# Patient Record
Sex: Female | Born: 1950
Health system: Southern US, Community
[De-identification: ages and names within clinical notes are randomized; demographics above are authoritative.]

## PROBLEM LIST (undated history)

## (undated) DIAGNOSIS — I10 Essential (primary) hypertension: Secondary | ICD-10-CM

## (undated) DIAGNOSIS — Z9889 Other specified postprocedural states: Secondary | ICD-10-CM

## (undated) DIAGNOSIS — R112 Nausea with vomiting, unspecified: Secondary | ICD-10-CM

## (undated) DIAGNOSIS — B029 Zoster without complications: Secondary | ICD-10-CM

## (undated) DIAGNOSIS — C4491 Basal cell carcinoma of skin, unspecified: Secondary | ICD-10-CM

## (undated) DIAGNOSIS — Z789 Other specified health status: Secondary | ICD-10-CM

## (undated) DIAGNOSIS — R609 Edema, unspecified: Secondary | ICD-10-CM

## (undated) HISTORY — DX: Other specified postprocedural states: Z98.890

## (undated) HISTORY — PX: BASAL CELL CARCINOMA EXCISION: SHX1214

## (undated) HISTORY — DX: Essential (primary) hypertension: I10

## (undated) HISTORY — DX: Nausea with vomiting, unspecified: R11.2

## (undated) HISTORY — DX: Zoster without complications: B02.9

## (undated) HISTORY — PX: TOTAL ABDOMINAL HYSTERECTOMY: SHX209

## (undated) HISTORY — PX: ABDOMINAL HYSTERECTOMY: SHX81

---
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
## 2012-08-24 ENCOUNTER — Emergency Department: Payer: Self-pay | Admitting: Emergency Medicine

## 2013-10-05 ENCOUNTER — Emergency Department: Payer: Self-pay | Admitting: Emergency Medicine

## 2014-11-08 ENCOUNTER — Emergency Department
Admission: EM | Admit: 2014-11-08 | Discharge: 2014-11-09 | Disposition: A | Discharge status: Home / Self Care | Payer: Worker's Compensation | Attending: Emergency Medicine | Admitting: Emergency Medicine

## 2014-11-08 ENCOUNTER — Encounter: Payer: Self-pay | Admitting: *Deleted

## 2014-11-08 DIAGNOSIS — Y9389 Activity, other specified: Secondary | ICD-10-CM | POA: Diagnosis not present

## 2014-11-08 DIAGNOSIS — S99922A Unspecified injury of left foot, initial encounter: Secondary | ICD-10-CM | POA: Diagnosis present

## 2014-11-08 DIAGNOSIS — S91332A Puncture wound without foreign body, left foot, initial encounter: Secondary | ICD-10-CM

## 2014-11-08 DIAGNOSIS — Y990 Civilian activity done for income or pay: Secondary | ICD-10-CM | POA: Insufficient documentation

## 2014-11-08 DIAGNOSIS — Y9289 Other specified places as the place of occurrence of the external cause: Secondary | ICD-10-CM | POA: Diagnosis not present

## 2014-11-08 DIAGNOSIS — Y288XXA Contact with other sharp object, undetermined intent, initial encounter: Secondary | ICD-10-CM | POA: Insufficient documentation

## 2014-11-08 MED ORDER — CEPHALEXIN 500 MG PO CAPS: 500.0000 mg | ORAL_CAPSULE | Freq: Two times a day (BID) | ORAL | Status: AC

## 2014-11-08 MED ORDER — TETANUS-DIPHTHERIA TOXOIDS TD 5-2 LFU IM INJ
0.5000 mL | INJECTION | Freq: Once | INTRAMUSCULAR | Status: AC
  Administered 2014-11-08: 0.5 mL via INTRAMUSCULAR
  Filled 2014-11-08: qty 0.5

## 2014-11-08 MED ORDER — CEPHALEXIN 500 MG PO CAPS
500.0000 mg | ORAL_CAPSULE | Freq: Once | ORAL | Status: AC
  Administered 2014-11-08: 500 mg via ORAL
  Filled 2014-11-08: qty 1

## 2014-11-08 MED ORDER — IBUPROFEN 800 MG PO TABS
800.0000 mg | ORAL_TABLET | Freq: Once | ORAL | Status: AC
  Administered 2014-11-08: 800 mg via ORAL
  Filled 2014-11-08: qty 1

## 2014-11-08 NOTE — ED Provider Notes (Signed)
[facility] Emergency Department Provider Note  ____________________________________________  Time seen: 11:30PM  I have reviewed the triage vital signs and the nursing notes.   HISTORY  Chief Complaint Foot Injury     HPI Vanessa Casey is a 64 y.o. female presents with history of stress stepping on a stable while at work in her left foot. Patient denies any fever unknown last tetanus shot. Patient stated that "bled briskly for a while however then spontaneously resolved.       Past medical history None There are no active problems to display for this patient.   Past Surgical History  Procedure Laterality Date  . Abdominal hysterectomy      No current outpatient prescriptions on file.  Allergies Review of patient's allergies indicates no known allergies.  No family history on file.  Social History Social History  Substance Use Topics  . Smoking status: Never Smoker   . Smokeless tobacco: None  . Alcohol Use: No    Review of Systems  Constitutional: Negative for fever. Eyes: Negative for visual changes. ENT: Negative for sore throat. Cardiovascular: Negative for chest pain. Respiratory: Negative for shortness of breath. Gastrointestinal: Negative for abdominal pain, vomiting and diarrhea. Genitourinary: Negative for dysuria. Musculoskeletal: Negative for back pain. Skin: Negative for rash. Positive puncture wound to the left foot Neurological: Negative for headaches, focal weakness or numbness.  10-point ROS otherwise negative.  ____________________________________________   PHYSICAL EXAM:  VITAL SIGNS: ED Triage Vitals  Enc Vitals Group     BP 11/08/14 2202 155/52 mmHg     Pulse Rate 11/08/14 2202 63     Resp 11/08/14 2202 18     Temp 11/08/14 2202 98.7 F (37.1 C)     Temp Source 11/08/14 2202 Oral     SpO2 11/08/14 2202 97 %     Weight 11/08/14 2202 150 lb (68.04 kg)     Height 11/08/14 2202 5' (1.524 m)     Head  Cir --      Peak Flow --      Pain Score 11/08/14 2202 5     Pain Loc --      Pain Edu? --      Excl. in [place]? --      Constitutional: Alert and oriented. Well appearing and in no distress. ENT   Head: Normocephalic and atraumatic.   Nose: No congestion/rhinnorhea.   Mouth/Throat: Mucous membranes are moist.   Neck: No stridor. Musculoskeletal: Nontender with normal range of motion in all extremities. No joint effusions.  No lower extremity tenderness nor edema. Neurologic:  Normal speech and language. No gross focal neurologic deficits are appreciated. Speech is normal.  Skin:  Skin is warm. 2 distinct submillimeter puncture wounds noted to the plantar aspect of the left foot.      INITIAL IMPRESSION / ASSESSMENT AND PLAN / ED COURSE  Pertinent labs & imaging results that were available during my care of the patient were reviewed by me and considered in my medical decision making (see chart for details).  Keflex 500mg  oral and tetanus given   ____________________________________________   FINAL CLINICAL IMPRESSION(S) / ED DIAGNOSES  Final diagnoses:  Puncture wound of left foot, initial encounter      Gregor Hams, MD 11/08/14 2356

## 2014-11-08 NOTE — ED Notes (Signed)
MD in with patient

## 2014-11-08 NOTE — Discharge Instructions (Signed)
Puncture Wound °A puncture wound is an injury that extends through all layers of the skin and into the tissue beneath the skin (subcutaneous tissue). Puncture wounds become infected easily because germs often enter the body and go beneath the skin during the injury. Having a deep wound with a small entrance point makes it difficult for your caregiver to adequately clean the wound. This is especially true if you have stepped on a nail and it has passed through a dirty shoe or other situations where the wound is obviously contaminated. °CAUSES  °Many puncture wounds involve glass, nails, splinters, fish hooks, or other objects that enter the skin (foreign bodies). A puncture wound may also be caused by a human bite or animal bite. °DIAGNOSIS  °A puncture wound is usually diagnosed by your history and a physical exam. You may need to have an X-ray or an ultrasound to check for any foreign bodies still in the wound. °TREATMENT  °· Your caregiver will clean the wound as thoroughly as possible. Depending on the location of the wound, a bandage (dressing) may be applied. °· Your caregiver might prescribe antibiotic medicines. °· You may need a follow-up visit to check on your wound. Follow all instructions as directed by your caregiver. °HOME CARE INSTRUCTIONS  °· Change your dressing once per day, or as directed by your caregiver. If the dressing sticks, it may be removed by soaking the area in water. °· If your caregiver has given you follow-up instructions, it is very important that you return for a follow-up appointment. Not following up as directed could result in a chronic or permanent injury, pain, and disability. °· Only take over-the-counter or prescription medicines for pain, discomfort, or fever as directed by your caregiver. °· If you are given antibiotics, take them as directed. Finish them even if you start to feel better. °You may need a tetanus shot if: °· You cannot remember when you had your last tetanus  shot. °· You have never had a tetanus shot. °If you got a tetanus shot, your arm may swell, get red, and feel warm to the touch. This is common and not a problem. If you need a tetanus shot and you choose not to have one, there is a rare chance of getting tetanus. Sickness from tetanus can be serious. °You may need a rabies shot if an animal bite caused your puncture wound. °SEEK MEDICAL CARE IF:  °· You have redness, swelling, or increasing pain in the wound. °· You have red streaks going away from the wound. °· You notice a bad smell coming from the wound or dressing. °· You have yellowish-white fluid (pus) coming from the wound. °· You are treated with an antibiotic for infection, but the infection is not getting better. °· You notice something in the wound, such as rubber from your shoe, cloth, or another object. °· You have a fever. °· You have severe pain. °· You have difficulty breathing. °· You feel dizzy or faint. °· You cannot stop vomiting. °· You lose feeling, develop numbness, or cannot move a limb below the wound. °· Your symptoms worsen. °MAKE SURE YOU: °· Understand these instructions. °· Will watch your condition. °· Will get help right away if you are not doing well or get worse. °Document Released: 12/04/2004 Document Revised: 05/19/2011 Document Reviewed: 08/13/2010 °ExitCare® Patient Information ©2015 [employer]. This information is not intended to replace advice given to you by your health care provider. Make sure you discuss any questions you   have with your health care provider. ° °

## 2014-11-08 NOTE — ED Notes (Signed)
Pt states she was at work and putting out the trash when she stepped on some staples with her left foot. Puncture wound to the bottom of the foot, c/o swelling and pain.

## 2016-12-11 ENCOUNTER — Ambulatory Visit (INDEPENDENT_AMBULATORY_CARE_PROVIDER_SITE_OTHER): Payer: PPO | Admitting: Family Medicine

## 2016-12-11 ENCOUNTER — Encounter: Payer: Self-pay | Admitting: Family Medicine

## 2016-12-11 VITALS — BP 132/60 | HR 63 | Ht 60.0 in | Wt 160.0 lb

## 2016-12-11 DIAGNOSIS — Z23 Encounter for immunization: Secondary | ICD-10-CM | POA: Diagnosis not present

## 2016-12-11 DIAGNOSIS — Z13220 Encounter for screening for lipoid disorders: Secondary | ICD-10-CM | POA: Diagnosis not present

## 2016-12-11 DIAGNOSIS — Z1231 Encounter for screening mammogram for malignant neoplasm of breast: Secondary | ICD-10-CM | POA: Diagnosis not present

## 2016-12-11 DIAGNOSIS — R030 Elevated blood-pressure reading, without diagnosis of hypertension: Secondary | ICD-10-CM | POA: Diagnosis not present

## 2016-12-11 DIAGNOSIS — Z1239 Encounter for other screening for malignant neoplasm of breast: Secondary | ICD-10-CM

## 2016-12-11 DIAGNOSIS — Z1159 Encounter for screening for other viral diseases: Secondary | ICD-10-CM | POA: Diagnosis not present

## 2016-12-11 DIAGNOSIS — R609 Edema, unspecified: Secondary | ICD-10-CM

## 2016-12-11 DIAGNOSIS — Z114 Encounter for screening for human immunodeficiency virus [HIV]: Secondary | ICD-10-CM | POA: Diagnosis not present

## 2016-12-11 NOTE — Progress Notes (Signed)
BP 132/60   Pulse 63   Ht 5' (1.524 m)   Wt 160 lb (72.6 kg)   SpO2 98%   BMI 31.25 kg/m    Subjective:    Patient ID: Vanessa Casey, female    DOB: Aug 14, 1950, 66 y.o.   MRN: [id number]  HPI: Geneen Dieter is a 66 y.o. female who presents today to establish care, she last saw a regular doctor about 3 years ago for a shingles outbreak, doesn't remember the last time she had blood work done.   Chief Complaint  Patient presents with  . Establish Care  . Foot Problem    Better.   She had some swelling in her legs and feet a couple of weeks ago, but that seems like it's gotten better. Now just when she's standing for a long period. She did have some pain at that time, but no pain now and no redness then.   ELEVATED BLOOD PRESSURE Duration of elevated BP: unknown BP monitoring frequency: not checking Previous BP meds: no Recent stressors: yes Family history of hypertension: yes Recurrent headaches: no Visual changes: no Palpitations: no  Dyspnea: no Chest pain: no Lower extremity edema: yes Dizzy/lightheaded: no Transient ischemic attacks: no   Hasn't had a mammogram in about 10 years- used to get them in [place] in [place], [place]  Active Ambulatory Problems    Diagnosis Date Noted  . No Active Ambulatory Problems   Resolved Ambulatory Problems    Diagnosis Date Noted  . No Resolved Ambulatory Problems   Past Medical History:  Diagnosis Date  . Shingles    Past Surgical History:  Procedure Laterality Date  . ABDOMINAL HYSTERECTOMY     No outpatient encounter prescriptions on file as of 12/11/2016.   No facility-administered encounter medications on file as of 12/11/2016.    No Known Allergies Social History   Social History  . Marital status: Single    Spouse name: N/A  . Number of children: N/A  . Years of education: N/A   Occupational History  . Not on file.   Social History Main Topics  . Smoking status: Never Smoker  . Smokeless tobacco:  Never Used  . Alcohol use No  . Drug use: No  . Sexual activity: Not on file   Other Topics Concern  . Not on file   Social History Narrative  . No narrative on file   Family History  Problem Relation Age of Onset  . Dementia Mother   . Heart failure Father   . Heart attack Sister   . Heart attack Brother     Review of Systems  Constitutional: Negative.   Cardiovascular: Positive for leg swelling. Negative for chest pain and palpitations.  Gastrointestinal: Negative.   Musculoskeletal: Negative.   Psychiatric/Behavioral: Negative.     Per HPI unless specifically indicated above     Objective:    BP 132/60   Pulse 63   Ht 5' (1.524 m)   Wt 160 lb (72.6 kg)   SpO2 98%   BMI 31.25 kg/m   Wt Readings from Last 3 Encounters:  12/11/16 160 lb (72.6 kg)  11/08/14 150 lb (68 kg)    Physical Exam  Constitutional: She is oriented to person, place, and time. She appears well-developed and well-nourished. No distress.  HENT:  Head: Normocephalic and atraumatic.  Right Ear: Hearing normal.  Left Ear: Hearing normal.  Nose: Nose normal.  Eyes: Conjunctivae and lids are normal. Right eye exhibits no  discharge. Left eye exhibits no discharge. No scleral icterus.  Cardiovascular: Normal rate, regular rhythm, normal heart sounds and intact distal pulses.  Exam reveals no gallop and no friction rub.   No murmur heard. Pulmonary/Chest: Effort normal and breath sounds normal. No respiratory distress. She has no wheezes. She has no rales. She exhibits no tenderness.  Musculoskeletal: Normal range of motion. She exhibits no edema, tenderness or deformity.  Neurological: She is alert and oriented to person, place, and time.  Skin: Skin is warm, dry and intact. No rash noted. She is not diaphoretic. No erythema. No pallor.  Psychiatric: She has a normal mood and affect. Her speech is normal and behavior is normal. Judgment and thought content normal. Cognition and memory are normal.    Nursing note and vitals reviewed.   No results found for this or any previous visit.    Assessment & Plan:   Problem List Items Addressed This Visit    None    Visit Diagnoses    Edema, unspecified type    -  Primary   No evidence of edema today. Recommended compression stockings when working. Checking labs. Call with any concerns.    Relevant Orders   CBC with Differential/Platelet   Comprehensive metabolic panel   TSH   UA/M w/rflx Culture, Routine   Hgb A1c w/o eAG   Elevated blood-pressure reading, without diagnosis of hypertension       Better on recheck. Work on [employer]. Call with any concerns.    Relevant Orders   CBC with Differential/Platelet   Comprehensive metabolic panel   Microalbumin, Urine Waived   Screening for cholesterol level       Labs checked today. Await results.    Relevant Orders   Lipid Panel w/o Chol/HDL Ratio   Screening for breast cancer       Mammogram ordered today.    Relevant Orders   MM DIGITAL SCREENING BILATERAL   Need for influenza vaccination       Flu shot given today.    Relevant Orders   Flu Vaccine QUAD 6+ mos PF IM (Fluarix Quad PF) (Completed)   Screening for HIV without presence of risk factors       Labs drawn today. Await results.    Relevant Orders   HIV antibody   Need for hepatitis C screening test       Labs drawn today. Await results.    Relevant Orders   Hepatitis C Antibody       Follow up plan: Return in about 4 weeks (around 01/08/2017) for Physical.

## 2016-12-11 NOTE — Patient Instructions (Addendum)
DASH Eating Plan DASH stands for "Dietary Approaches to Stop Hypertension." The DASH eating plan is a healthy eating plan that has been shown to reduce high blood pressure (hypertension). It may also reduce your risk for type 2 diabetes, heart disease, and stroke. The DASH eating plan may also help with weight loss. What are tips for following this plan? General guidelines  Avoid eating more than 2,300 mg (milligrams) of salt (sodium) a day. If you have hypertension, you may need to reduce your sodium intake to 1,500 mg a day.  Limit alcohol intake to no more than 1 drink a day for nonpregnant women and 2 drinks a day for men. One drink equals 12 oz of beer, 5 oz of wine, or 1 oz of hard liquor.  Work with your health care provider to maintain a healthy body weight or to lose weight. Ask what an ideal weight is for you.  Get at least 30 minutes of exercise that causes your heart to beat faster (aerobic exercise) most days of the week. Activities may include walking, swimming, or biking.  Work with your health care provider or diet and nutrition specialist (dietitian) to adjust your eating plan to your individual calorie needs. Reading food labels  Check food labels for the amount of sodium per serving. Choose foods with less than 5 percent of the Daily Value of sodium. Generally, foods with less than 300 mg of sodium per serving fit into this eating plan.  To find whole grains, look for the word "whole" as the first word in the ingredient list. Shopping  Buy products labeled as "low-sodium" or "no salt added."  Buy fresh foods. Avoid canned foods and premade or frozen meals. Cooking  Avoid adding salt when cooking. Use salt-free seasonings or herbs instead of table salt or sea salt. Check with your health care provider or pharmacist before using salt substitutes.  Do not fry foods. Cook foods using healthy methods such as baking, boiling, grilling, and broiling instead.  Cook with  heart-healthy oils, such as olive, canola, soybean, or sunflower oil. Meal planning   Eat a balanced diet that includes: ? 5 or more servings of fruits and vegetables each day. At each meal, try to fill half of your plate with fruits and vegetables. ? Up to 6-8 servings of whole grains each day. ? Less than 6 oz of lean meat, poultry, or fish each day. A 3-oz serving of meat is about the same size as a deck of cards. One egg equals 1 oz. ? 2 servings of low-fat dairy each day. ? A serving of nuts, seeds, or beans 5 times each week. ? Heart-healthy fats. Healthy fats called Omega-3 fatty acids are found in foods such as flaxseeds and coldwater fish, like sardines, salmon, and mackerel.  Limit how much you eat of the following: ? Canned or prepackaged foods. ? Food that is high in trans fat, such as fried foods. ? Food that is high in saturated fat, such as fatty meat. ? Sweets, desserts, sugary drinks, and other foods with added sugar. ? Full-fat dairy products.  Do not salt foods before eating.  Try to eat at least 2 vegetarian meals each week.  Eat more home-cooked food and less restaurant, buffet, and fast food.  When eating at a restaurant, ask that your food be prepared with less salt or no salt, if possible. What foods are recommended? The items listed may not be a complete list. Talk with your dietitian about what   dietary choices are best for you. Grains Whole-grain or whole-wheat bread. Whole-grain or whole-wheat pasta. Brown rice. Oatmeal. Quinoa. Bulgur. Whole-grain and low-sodium cereals. Pita bread. Low-fat, low-sodium crackers. Whole-wheat flour tortillas. Vegetables Fresh or frozen vegetables (raw, steamed, roasted, or grilled). Low-sodium or reduced-sodium tomato and vegetable juice. Low-sodium or reduced-sodium tomato sauce and tomato paste. Low-sodium or reduced-sodium canned vegetables. Fruits All fresh, dried, or frozen fruit. Canned fruit in natural juice (without  added sugar). Meat and other protein foods Skinless chicken or turkey. Ground chicken or turkey. Pork with fat trimmed off. Fish and seafood. Egg whites. Dried beans, peas, or lentils. Unsalted nuts, nut butters, and seeds. Unsalted canned beans. Lean cuts of beef with fat trimmed off. Low-sodium, lean deli meat. Dairy Low-fat (1%) or fat-free (skim) milk. Fat-free, low-fat, or reduced-fat cheeses. Nonfat, low-sodium ricotta or cottage cheese. Low-fat or nonfat yogurt. Low-fat, low-sodium cheese. Fats and oils Soft margarine without trans fats. Vegetable oil. Low-fat, reduced-fat, or light mayonnaise and salad dressings (reduced-sodium). Canola, safflower, olive, soybean, and sunflower oils. Avocado. Seasoning and other foods Herbs. Spices. Seasoning mixes without salt. Unsalted popcorn and pretzels. Fat-free sweets. What foods are not recommended? The items listed may not be a complete list. Talk with your dietitian about what dietary choices are best for you. Grains Baked goods made with fat, such as croissants, muffins, or some breads. Dry pasta or rice meal packs. Vegetables Creamed or fried vegetables. Vegetables in a cheese sauce. Regular canned vegetables (not low-sodium or reduced-sodium). Regular canned tomato sauce and paste (not low-sodium or reduced-sodium). Regular tomato and vegetable juice (not low-sodium or reduced-sodium). Pickles. Olives. Fruits Canned fruit in a light or heavy syrup. Fried fruit. Fruit in cream or butter sauce. Meat and other protein foods Fatty cuts of meat. Ribs. Fried meat. Bacon. Sausage. Bologna and other processed lunch meats. Salami. Fatback. Hotdogs. Bratwurst. Salted nuts and seeds. Canned beans with added salt. Canned or smoked fish. Whole eggs or egg yolks. Chicken or turkey with skin. Dairy Whole or 2% milk, cream, and half-and-half. Whole or full-fat cream cheese. Whole-fat or sweetened yogurt. Full-fat cheese. Nondairy creamers. Whipped toppings.  Processed cheese and cheese spreads. Fats and oils Butter. Stick margarine. Lard. Shortening. Ghee. Bacon fat. Tropical oils, such as coconut, palm kernel, or palm oil. Seasoning and other foods Salted popcorn and pretzels. Onion salt, garlic salt, seasoned salt, table salt, and sea salt. Worcestershire sauce. Tartar sauce. Barbecue sauce. Teriyaki sauce. Soy sauce, including reduced-sodium. Steak sauce. Canned and packaged gravies. Fish sauce. Oyster sauce. Cocktail sauce. Horseradish that you find on the shelf. Ketchup. Mustard. Meat flavorings and tenderizers. Bouillon cubes. Hot sauce and Tabasco sauce. Premade or packaged marinades. Premade or packaged taco seasonings. Relishes. Regular salad dressings. Where to find more information:  National Heart, Lung, and Blood Institute: www.nhlbi.nih.gov  American Heart Association: www.heart.org Summary  The DASH eating plan is a healthy eating plan that has been shown to reduce high blood pressure (hypertension). It may also reduce your risk for type 2 diabetes, heart disease, and stroke.  With the DASH eating plan, you should limit salt (sodium) intake to 2,300 mg a day. If you have hypertension, you may need to reduce your sodium intake to 1,500 mg a day.  When on the DASH eating plan, aim to eat more fresh fruits and vegetables, whole grains, lean proteins, low-fat dairy, and heart-healthy fats.  Work with your health care provider or diet and nutrition specialist (dietitian) to adjust your eating plan to your individual   calorie needs. This information is not intended to replace advice given to you by your health care provider. Make sure you discuss any questions you have with your health care provider. Document Released: 02/13/2011 Document Revised: 02/18/2016 Document Reviewed: 02/18/2016 Elsevier Interactive Patient Education  2017 [employer]  

## 2016-12-12 ENCOUNTER — Encounter: Payer: Self-pay | Admitting: Family Medicine

## 2016-12-12 LAB — CBC WITH DIFFERENTIAL/PLATELET
BASOS ABS: 0 10*3/uL (ref 0.0–0.2)
Basos: 0 %
EOS (ABSOLUTE): 0.1 10*3/uL (ref 0.0–0.4)
Eos: 1 %
Hematocrit: 35 % (ref 34.0–46.6)
Hemoglobin: 11.9 g/dL (ref 11.1–15.9)
Immature Grans (Abs): 0 10*3/uL (ref 0.0–0.1)
Immature Granulocytes: 0 %
LYMPHS ABS: 1.1 10*3/uL (ref 0.7–3.1)
Lymphs: 22 %
MCH: 27.3 pg (ref 26.6–33.0)
MCHC: 34 g/dL (ref 31.5–35.7)
MCV: 80 fL (ref 79–97)
MONOCYTES: 8 %
MONOS ABS: 0.4 10*3/uL (ref 0.1–0.9)
NEUTROS ABS: 3.5 10*3/uL (ref 1.4–7.0)
Neutrophils: 69 %
PLATELETS: 249 10*3/uL (ref 150–379)
RBC: 4.36 x10E6/uL (ref 3.77–5.28)
RDW: 14.6 % (ref 12.3–15.4)
WBC: 5 10*3/uL (ref 3.4–10.8)

## 2016-12-12 LAB — LIPID PANEL W/O CHOL/HDL RATIO
CHOLESTEROL TOTAL: 186 mg/dL (ref 100–199)
HDL: 45 mg/dL (ref >39)
LDL CALC: 79 mg/dL (ref 0–99)
TRIGLYCERIDES: 312 mg/dL — AB (ref 0–149)
VLDL CHOLESTEROL CAL: 62 mg/dL — AB (ref 5–40)

## 2016-12-12 LAB — COMPREHENSIVE METABOLIC PANEL WITH GFR
ALT: 15 [IU]/L (ref 0–32)
AST: 22 [IU]/L (ref 0–40)
Albumin/Globulin Ratio: 1.3 (ref 1.2–2.2)
Albumin: 4.1 g/dL (ref 3.6–4.8)
Alkaline Phosphatase: 96 [IU]/L (ref 39–117)
BUN/Creatinine Ratio: 17 (ref 12–28)
BUN: 17 mg/dL (ref 8–27)
Bilirubin Total: 0.2 mg/dL (ref 0.0–1.2)
CO2: 22 mmol/L (ref 20–29)
Calcium: 9.2 mg/dL (ref 8.7–10.3)
Chloride: 104 mmol/L (ref 96–106)
Creatinine, Ser: 0.99 mg/dL (ref 0.57–1.00)
GFR calc Af Amer: 70 mL/min/{1.73_m2}
GFR calc non Af Amer: 60 mL/min/{1.73_m2}
Globulin, Total: 3.2 g/dL (ref 1.5–4.5)
Glucose: 86 mg/dL (ref 65–99)
Potassium: 3.9 mmol/L (ref 3.5–5.2)
Sodium: 141 mmol/L (ref 134–144)
Total Protein: 7.3 g/dL (ref 6.0–8.5)

## 2016-12-12 LAB — TSH: TSH: 3.46 u[IU]/mL (ref 0.450–4.500)

## 2016-12-12 LAB — HIV ANTIBODY (ROUTINE TESTING W REFLEX): HIV SCREEN 4TH GENERATION: NONREACTIVE

## 2016-12-12 LAB — HEPATITIS C ANTIBODY: Hep C Virus Ab: <0.1 {s_co_ratio} (ref 0.0–0.9)

## 2016-12-12 LAB — HGB A1C W/O EAG: Hgb A1c MFr Bld: 5.7 % — ABNORMAL HIGH (ref 4.8–5.6)

## 2016-12-22 ENCOUNTER — Ambulatory Visit
Admission: RE | Admit: 2016-12-22 | Discharge: 2016-12-22 | Disposition: A | Discharge status: Home / Self Care | Payer: PPO | Source: Ambulatory Visit | Attending: Family Medicine | Admitting: Family Medicine

## 2016-12-22 DIAGNOSIS — Z1231 Encounter for screening mammogram for malignant neoplasm of breast: Secondary | ICD-10-CM | POA: Insufficient documentation

## 2016-12-22 DIAGNOSIS — Z1239 Encounter for other screening for malignant neoplasm of breast: Secondary | ICD-10-CM

## 2016-12-30 ENCOUNTER — Encounter: Payer: Self-pay | Admitting: Family Medicine

## 2016-12-30 ENCOUNTER — Other Ambulatory Visit: Payer: Self-pay | Admitting: *Deleted

## 2016-12-30 ENCOUNTER — Inpatient Hospital Stay
Admission: RE | Admit: 2016-12-30 | Discharge: 2016-12-30 | Disposition: A | Discharge status: Home / Self Care | Payer: Self-pay | Source: Ambulatory Visit | Attending: *Deleted | Admitting: *Deleted

## 2016-12-30 DIAGNOSIS — Z9289 Personal history of other medical treatment: Secondary | ICD-10-CM

## 2017-01-12 ENCOUNTER — Encounter: Payer: Self-pay | Admitting: Family Medicine

## 2017-01-12 ENCOUNTER — Ambulatory Visit (INDEPENDENT_AMBULATORY_CARE_PROVIDER_SITE_OTHER): Payer: PPO | Admitting: Family Medicine

## 2017-01-12 VITALS — BP 136/70 | HR 62 | Temp 97.6°F | Ht 60.4 in | Wt 162.1 lb

## 2017-01-12 DIAGNOSIS — Z7189 Other specified counseling: Secondary | ICD-10-CM | POA: Diagnosis not present

## 2017-01-12 DIAGNOSIS — Z13820 Encounter for screening for osteoporosis: Secondary | ICD-10-CM

## 2017-01-12 DIAGNOSIS — Z136 Encounter for screening for cardiovascular disorders: Secondary | ICD-10-CM

## 2017-01-12 DIAGNOSIS — Z23 Encounter for immunization: Secondary | ICD-10-CM | POA: Diagnosis not present

## 2017-01-12 DIAGNOSIS — Z1211 Encounter for screening for malignant neoplasm of colon: Secondary | ICD-10-CM

## 2017-01-12 DIAGNOSIS — Z0000 Encounter for general adult medical examination without abnormal findings: Secondary | ICD-10-CM | POA: Diagnosis not present

## 2017-01-12 NOTE — Assessment & Plan Note (Signed)
A voluntary discussion about advance care planning including the explanation and discussion of advance directives was extensively discussed  with the patient.  Explanation about the health care proxy and Living will was reviewed and packet with forms with explanation of how to fill them out was given.  During this discussion, the patient was not able to identify a health care proxy and plans to fill out the paperwork required.  Patient was offered a separate [place] visit for further assistance with forms.

## 2017-01-12 NOTE — Progress Notes (Signed)
BP 136/70 (BP Location: Left Arm, Cuff Size: Normal)   Pulse 62   Temp 97.6 F (36.4 C)   Ht 5' 0.4" (1.534 m)   Wt 162 lb 1 oz (73.5 kg)   SpO2 98%   BMI 31.23 kg/m    Subjective:    Patient ID: Vanessa Casey, female    DOB: 1950/04/28, 66 y.o.   MRN: [id number]  HPI: Vanessa Casey is a 66 y.o. female presenting on 01/12/2017 for comprehensive medical examination. Current medical complaints include:none  She currently lives with: boyfriend Menopausal Symptoms: no  Functional Status Survey: Is the patient deaf or have difficulty hearing?: No Does the patient have difficulty seeing, even when wearing glasses/contacts?: No Does the patient have difficulty concentrating, remembering, or making decisions?: No Does the patient have difficulty walking or climbing stairs?: No Does the patient have difficulty dressing or bathing?: No Does the patient have difficulty doing errands alone such as visiting a doctor's office or shopping?: No  Fall Risk  01/12/2017  Falls in the past year? No    Depression Screen Depression screen PHQ 2/9 01/12/2017  Decreased Interest 0  Down, Depressed, Hopeless 0  PHQ - 2 Score 0   Advanced Directives Does patient have a HCPOA?    no Does patient have a living will or MOST form?  no  Past Medical History:  Past Medical History:  Diagnosis Date  . Shingles     Surgical History:  Past Surgical History:  Procedure Laterality Date  . ABDOMINAL HYSTERECTOMY      Medications:  No current outpatient medications on file prior to visit.   No current facility-administered medications on file prior to visit.     Allergies:  No Known Allergies  Social History:  Social History   Socioeconomic History  . Marital status: Single    Spouse name: Not on file  . Number of children: Not on file  . Years of education: Not on file  . Highest education level: Not on file  Social Needs  . Financial resource strain: Not on file  . Food insecurity  - worry: Not on file  . Food insecurity - inability: Not on file  . Transportation needs - medical: Not on file  . Transportation needs - non-medical: Not on file  Occupational History  . Not on file  Tobacco Use  . Smoking status: Never Smoker  . Smokeless tobacco: Never Used  Substance and Sexual Activity  . Alcohol use: No  . Drug use: No  . Sexual activity: No  Other Topics Concern  . Not on file  Social History Narrative  . Not on file   Social History   Tobacco Use  Smoking Status Never Smoker  Smokeless Tobacco Never Used   Social History   Substance and Sexual Activity  Alcohol Use No    Family History:  Family History  Problem Relation Age of Onset  . Dementia Mother   . Heart failure Father   . Heart attack Sister   . Heart attack Brother   . Breast cancer Maternal Aunt     Past medical history, surgical history, medications, allergies, family history and social history reviewed with patient today and changes made to appropriate areas of the chart.   Review of Systems  Constitutional: Negative.   HENT: Negative.   Eyes: Positive for blurred vision. Negative for double vision, photophobia, pain, discharge and redness.  Respiratory: Negative.   Cardiovascular: Positive for leg swelling. Negative for chest pain,  palpitations, orthopnea, claudication and PND.  Gastrointestinal: Positive for constipation. Negative for abdominal pain, blood in stool, diarrhea, heartburn, melena, nausea and vomiting.  Genitourinary: Negative.   Musculoskeletal: Negative.   Skin: Negative.   Neurological: Negative.   Endo/Heme/Allergies: Negative.   Psychiatric/Behavioral: Negative.    All other ROS negative except what is listed above and in the HPI.      Objective:    BP 136/70 (BP Location: Left Arm, Cuff Size: Normal)   Pulse 62   Temp 97.6 F (36.4 C)   Ht 5' 0.4" (1.534 m)   Wt 162 lb 1 oz (73.5 kg)   SpO2 98%   BMI 31.23 kg/m   Wt Readings from Last 3  Encounters:  01/12/17 162 lb 1 oz (73.5 kg)  12/11/16 160 lb (72.6 kg)  11/08/14 150 lb (68 kg)     Hearing Screening   125Hz  250Hz  500Hz  1000Hz  2000Hz  3000Hz  4000Hz  6000Hz  8000Hz   Right ear:   20 20 20  20     Left ear:   20 20 20  20       Visual Acuity Screening   Right eye Left eye Both eyes  Without correction:     With correction: 20/70 20/40 20/30     Physical Exam  6CIT Screen 01/12/2017  What Year? 0 points  What month? 0 points  What time? 0 points  Count back from 20 0 points  Months in reverse 0 points  Repeat phrase 2 points  Total Score 2     Results for orders placed or performed in visit on 12/11/16  CBC with Differential/Platelet  Result Value Ref Range   WBC 5.0 3.4 - 10.8 x10E3/uL   RBC 4.36 3.77 - 5.28 x10E6/uL   Hemoglobin 11.9 11.1 - 15.9 g/dL   Hematocrit 35.0 34.0 - 46.6 %   MCV 80 79 - 97 fL   MCH 27.3 26.6 - 33.0 pg   MCHC 34.0 31.5 - 35.7 g/dL   RDW 14.6 12.3 - 15.4 %   Platelets 249 150 - 379 x10E3/uL   Neutrophils 69 Not Estab. %   Lymphs 22 Not Estab. %   Monocytes 8 Not Estab. %   Eos 1 Not Estab. %   Basos 0 Not Estab. %   Neutrophils Absolute 3.5 1.4 - 7.0 x10E3/uL   Lymphocytes Absolute 1.1 0.7 - 3.1 x10E3/uL   Monocytes Absolute 0.4 0.1 - 0.9 x10E3/uL   EOS (ABSOLUTE) 0.1 0.0 - 0.4 x10E3/uL   Basophils Absolute 0.0 0.0 - 0.2 x10E3/uL   Immature Granulocytes 0 Not Estab. %   Immature Grans (Abs) 0.0 0.0 - 0.1 x10E3/uL  Comprehensive metabolic panel  Result Value Ref Range   Glucose 86 65 - 99 mg/dL   BUN 17 8 - 27 mg/dL   Creatinine, Ser 0.99 0.57 - 1.00 mg/dL   GFR calc non Af Amer 60 >59 mL/min/1.73   GFR calc Af Amer 70 >59 mL/min/1.73   BUN/Creatinine Ratio 17 12 - 28   Sodium 141 134 - 144 mmol/L   Potassium 3.9 3.5 - 5.2 mmol/L   Chloride 104 96 - 106 mmol/L   CO2 22 20 - 29 mmol/L   Calcium 9.2 8.7 - 10.3 mg/dL   Total Protein 7.3 6.0 - 8.5 g/dL   Albumin 4.1 3.6 - 4.8 g/dL   Globulin, Total 3.2 1.5 - 4.5 g/dL    Albumin/Globulin Ratio 1.3 1.2 - 2.2   Bilirubin Total 0.2 0.0 - 1.2 mg/dL   Alkaline Phosphatase  96 39 - 117 IU/L   AST 22 0 - 40 IU/L   ALT 15 0 - 32 IU/L  Lipid Panel w/o Chol/HDL Ratio  Result Value Ref Range   Cholesterol, Total 186 100 - 199 mg/dL   Triglycerides 312 (H) 0 - 149 mg/dL   HDL 45 >39 mg/dL   VLDL Cholesterol Cal 62 (H) 5 - 40 mg/dL   LDL Calculated 79 0 - 99 mg/dL  TSH  Result Value Ref Range   TSH 3.460 0.450 - 4.500 uIU/mL  Hgb A1c w/o eAG  Result Value Ref Range   Hgb A1c MFr Bld 5.7 (H) 4.8 - 5.6 %  Hepatitis C Antibody  Result Value Ref Range   Hep C Virus Ab <0.1 0.0 - 0.9 s/co ratio  HIV antibody  Result Value Ref Range   HIV Screen 4th Generation wRfx Non Reactive Non Reactive      Assessment & Plan:   Problem List Items Addressed This Visit      Other   Advance directive discussed with patient    A voluntary discussion about advance care planning including the explanation and discussion of advance directives was extensively discussed  with the patient.  Explanation about the health care proxy and Living will was reviewed and packet with forms with explanation of how to fill them out was given.  During this discussion, the patient was not able to identify a health care proxy and plans to fill out the paperwork required.  Patient was offered a separate [place] visit for further assistance with forms.          Other Visit Diagnoses    Welcome to Medicare preventive visit    -  Primary   Preventitive care discussed as below. Call with any concerns.    Relevant Orders   EKG 12-Lead (Completed)   Encounter for screening for cardiovascular disorders       EKG normal today. Call with any concerns.    Relevant Orders   EKG 12-Lead (Completed)   Screening for osteoporosis       DEXA ordered today- patient will call to schedule.   Relevant Orders   DG Bone Density   Immunization due       Prevnar given today.   Colon cancer screening        Cologuard given today.   Relevant Orders   Cologuard      Preventative Services:  AAA screening: N/A Health Risk Assessment and Personalized Prevention Plan: Done today Bone Mass Measurements: Ordered today. Breast Cancer Screening: Up to date CVD Screening: Done today Cervical Cancer Screening: N/A Colon Cancer Screening: Cologuard ordered today Depression Screening: Done today Diabetes Screening: Done last visit Glaucoma Screening: See your eye doctor Hepatitis B vaccine: N/A Hepatitis C screening: Up to date HIV Screening: Up to date Flu Vaccine: Up to date Lung cancer Screening: N/A Obesity Screening: Done today Pneumonia Vaccines (2): Prevnar given today STI Screening: N/A  Follow up plan: Return in about 1 year (around 01/12/2018) for Wellness exam.   LABORATORY TESTING:  - Pap smear: not applicable  IMMUNIZATIONS:   - Tdap: Tetanus vaccination status reviewed: last tetanus booster within 10 years. - Influenza: Up to date - Pneumovax: Not applicable - Prevnar: Administered today - Zostavax vaccine: Given elsewhere  SCREENING: -Mammogram: Up to date  - Colonoscopy: Ordered today  - Bone Density: Ordered today  -Hearing Test: Ordered today   PATIENT COUNSELING:   Advised to take 1 mg  of folate supplement per day if capable of pregnancy.   Sexuality: Discussed sexually transmitted diseases, partner selection, use of condoms, avoidance of unintended pregnancy  and contraceptive alternatives.   Advised to avoid cigarette smoking.  I discussed with the patient that most people either abstain from alcohol or drink within safe limits (<=14/week and <=4 drinks/occasion for males, <=7/weeks and <= 3 drinks/occasion for females) and that the risk for alcohol disorders and other health effects rises proportionally with the number of drinks per week and how often a drinker exceeds daily limits.  Discussed cessation/primary prevention of drug use and availability  of treatment for abuse.   Diet: Encouraged to adjust caloric intake to maintain  or achieve ideal body weight, to reduce intake of dietary saturated fat and total fat, to limit sodium intake by avoiding high sodium foods and not adding table salt, and to maintain adequate dietary potassium and calcium preferably from fresh fruits, vegetables, and low-fat dairy products.    stressed the importance of regular exercise  Injury prevention: Discussed safety belts, safety helmets, smoke detector, smoking near bedding or upholstery.   Dental health: Discussed importance of regular tooth brushing, flossing, and dental visits.    NEXT PREVENTATIVE PHYSICAL DUE IN 1 YEAR. Return in about 1 year (around 01/12/2018) for Wellness exam.

## 2017-01-12 NOTE — Patient Instructions (Addendum)
Preventative Services:  AAA screening: N/A Health Risk Assessment and Personalized Prevention Plan: Done today Bone Mass Measurements: Ordered today. Breast Cancer Screening: Up to date CVD Screening: Done today Cervical Cancer Screening: N/A Colon Cancer Screening: Cologuard ordered today Depression Screening: Done today Diabetes Screening: Done last visit Glaucoma Screening: See your eye doctor Hepatitis B vaccine: N/A Hepatitis C screening: Up to date HIV Screening: Up to date Flu Vaccine: Up to date Lung cancer Screening: N/A Obesity Screening: Done today Pneumonia Vaccines (2): Prevnar given today STI Screening: N/A  Health Maintenance, Female Adopting a healthy lifestyle and getting preventive care can go a long way to promote health and wellness. Talk with your health care provider about what schedule of regular examinations is right for you. This is a good chance for you to check in with your provider about disease prevention and staying healthy. In between checkups, there are plenty of things you can do on your own. Experts have done a lot of research about which lifestyle changes and preventive measures are most likely to keep you healthy. Ask your health care provider for more information. Weight and diet Eat a healthy diet  Be sure to include plenty of vegetables, fruits, low-fat dairy products, and lean protein.  Do not eat a lot of foods high in solid fats, added sugars, or salt.  Get regular exercise. This is one of the most important things you can do for your health. ? Most adults should exercise for at least 150 minutes each week. The exercise should increase your heart rate and make you sweat (moderate-intensity exercise). ? Most adults should also do strengthening exercises at least twice a week. This is in addition to the moderate-intensity exercise.  Maintain a healthy weight  Body mass index (BMI) is a measurement that can be used to identify possible weight  problems. It estimates body fat based on height and weight. Your health care provider can help determine your BMI and help you achieve or maintain a healthy weight.  For females 16 years of age and older: ? A BMI below 18.5 is considered underweight. ? A BMI of 18.5 to 24.9 is normal. ? A BMI of 25 to 29.9 is considered overweight. ? A BMI of 30 and above is considered obese.  Watch levels of cholesterol and blood lipids  You should start having your blood tested for lipids and cholesterol at 65 years of age, then have this test every 5 years.  You may need to have your cholesterol levels checked more often if: ? Your lipid or cholesterol levels are high. ? You are older than 65 years of age. ? You are at high risk for heart disease.  Cancer screening Lung Cancer  Lung cancer screening is recommended for adults 26-23 years old who are at high risk for lung cancer because of a history of smoking.  A yearly low-dose CT scan of the lungs is recommended for people who: ? Currently smoke. ? Have quit within the past 15 years. ? Have at least a 30-pack-year history of smoking. A pack year is smoking an average of one pack of cigarettes a day for 1 year.  Yearly screening should continue until it has been 15 years since you quit.  Yearly screening should stop if you develop a health problem that would prevent you from having lung cancer treatment.  Breast Cancer  Practice breast self-awareness. This means understanding how your breasts normally appear and feel.  It also means doing regular breast  self-exams. Let your health care provider know about any changes, no matter how small.  If you are in your 20s or 30s, you should have a clinical breast exam (CBE) by a health care provider every 1-3 years as part of a regular health exam.  If you are 44 or older, have a CBE every year. Also consider having a breast X-ray (mammogram) every year.  If you have a family history of breast  cancer, talk to your health care provider about genetic screening.  If you are at high risk for breast cancer, talk to your health care provider about having an MRI and a mammogram every year.  Breast cancer gene (BRCA) assessment is recommended for women who have family members with BRCA-related cancers. BRCA-related cancers include: ? Breast. ? Ovarian. ? Tubal. ? Peritoneal cancers.  Results of the assessment will determine the need for genetic counseling and BRCA1 and BRCA2 testing.  Cervical Cancer Your health care provider may recommend that you be screened regularly for cancer of the pelvic organs (ovaries, uterus, and vagina). This screening involves a pelvic examination, including checking for microscopic changes to the surface of your cervix (Pap test). You may be encouraged to have this screening done every 3 years, beginning at age 66.  For women ages 69-65, health care providers may recommend pelvic exams and Pap testing every 3 years, or they may recommend the Pap and pelvic exam, combined with testing for human papilloma virus (HPV), every 5 years. Some types of HPV increase your risk of cervical cancer. Testing for HPV may also be done on women of any age with unclear Pap test results.  Other health care providers may not recommend any screening for nonpregnant women who are considered low risk for pelvic cancer and who do not have symptoms. Ask your health care provider if a screening pelvic exam is right for you.  If you have had past treatment for cervical cancer or a condition that could lead to cancer, you need Pap tests and screening for cancer for at least 20 years after your treatment. If Pap tests have been discontinued, your risk factors (such as having a new sexual partner) need to be reassessed to determine if screening should resume. Some women have medical problems that increase the chance of getting cervical cancer. In these cases, your health care provider may  recommend more frequent screening and Pap tests.  Colorectal Cancer  This type of cancer can be detected and often prevented.  Routine colorectal cancer screening usually begins at 65 years of age and continues through 65 years of age.  Your health care provider may recommend screening at an earlier age if you have risk factors for colon cancer.  Your health care provider may also recommend using home test kits to check for hidden blood in the stool.  A small camera at the end of a tube can be used to examine your colon directly (sigmoidoscopy or colonoscopy). This is done to check for the earliest forms of colorectal cancer.  Routine screening usually begins at age 30.  Direct examination of the colon should be repeated every 5-10 years through 65 years of age. However, you may need to be screened more often if early forms of precancerous polyps or small growths are found.  Skin Cancer  Check your skin from head to toe regularly.  Tell your health care provider about any new moles or changes in moles, especially if there is a change in a mole's shape or  color.  Also tell your health care provider if you have a mole that is larger than the size of a pencil eraser.  Always use sunscreen. Apply sunscreen liberally and repeatedly throughout the day.  Protect yourself by wearing long sleeves, pants, a wide-brimmed hat, and sunglasses whenever you are outside.  Heart disease, diabetes, and high blood pressure  High blood pressure causes heart disease and increases the risk of stroke. High blood pressure is more likely to develop in: ? People who have blood pressure in the high end of the normal range (130-139/85-89 mm Hg). ? People who are overweight or obese. ? People who are African American.  If you are 20-65 years of age, have your blood pressure checked every 3-5 years. If you are 20 years of age or older, have your blood pressure checked every year. You should have your blood  pressure measured twice-once when you are at a hospital or clinic, and once when you are not at a hospital or clinic. Record the average of the two measurements. To check your blood pressure when you are not at a hospital or clinic, you can use: ? An automated blood pressure machine at a pharmacy. ? A home blood pressure monitor.  If you are between 45 years and 1 years old, ask your health care provider if you should take aspirin to prevent strokes.  Have regular diabetes screenings. This involves taking a blood sample to check your fasting blood sugar level. ? If you are at a normal weight and have a low risk for diabetes, have this test once every three years after 65 years of age. ? If you are overweight and have a high risk for diabetes, consider being tested at a younger age or more often. Preventing infection Hepatitis B  If you have a higher risk for hepatitis B, you should be screened for this virus. You are considered at high risk for hepatitis B if: ? You were born in a country where hepatitis B is common. Ask your health care provider which countries are considered high risk. ? Your parents were born in a high-risk country, and you have not been immunized against hepatitis B (hepatitis B vaccine). ? You have HIV or AIDS. ? You use needles to inject street drugs. ? You live with someone who has hepatitis B. ? You have had sex with someone who has hepatitis B. ? You get hemodialysis treatment. ? You take certain medicines for conditions, including cancer, organ transplantation, and autoimmune conditions.  Hepatitis C  Blood testing is recommended for: ? Everyone born from [birth year] through 1964. ? Anyone with known risk factors for hepatitis C.  Sexually transmitted infections (STIs)  You should be screened for sexually transmitted infections (STIs) including gonorrhea and chlamydia if: ? You are sexually active and are younger than 65 years of age. ? You are older than 65 years  of age and your health care provider tells you that you are at risk for this type of infection. ? Your sexual activity has changed since you were last screened and you are at an increased risk for chlamydia or gonorrhea. Ask your health care provider if you are at risk.  If you do not have HIV, but are at risk, it may be recommended that you take a prescription medicine daily to prevent HIV infection. This is called pre-exposure prophylaxis (PrEP). You are considered at risk if: ? You are sexually active and do not regularly use condoms or know the HIV  status of your partner(s). ? You take drugs by injection. ? You are sexually active with a partner who has HIV.  Talk with your health care provider about whether you are at high risk of being infected with HIV. If you choose to begin PrEP, you should first be tested for HIV. You should then be tested every 3 months for as long as you are taking PrEP. Pregnancy  If you are premenopausal and you may become pregnant, ask your health care provider about preconception counseling.  If you may become pregnant, take 400 to 800 micrograms (mcg) of folic acid every day.  If you want to prevent pregnancy, talk to your health care provider about birth control (contraception). Osteoporosis and menopause  Osteoporosis is a disease in which the bones lose minerals and strength with aging. This can result in serious bone fractures. Your risk for osteoporosis can be identified using a bone density scan.  If you are 52 years of age or older, or if you are at risk for osteoporosis and fractures, ask your health care provider if you should be screened.  Ask your health care provider whether you should take a calcium or vitamin D supplement to lower your risk for osteoporosis.  Menopause may have certain physical symptoms and risks.  Hormone replacement therapy may reduce some of these symptoms and risks. Talk to your health care provider about whether hormone  replacement therapy is right for you. Follow these instructions at home:  Schedule regular health, dental, and eye exams.  Stay current with your immunizations.  Do not use any tobacco products including cigarettes, chewing tobacco, or electronic cigarettes.  If you are pregnant, do not drink alcohol.  If you are breastfeeding, limit how much and how often you drink alcohol.  Limit alcohol intake to no more than 1 drink per day for nonpregnant women. One drink equals 12 ounces of beer, 5 ounces of wine, or 1 ounces of hard liquor.  Do not use street drugs.  Do not share needles.  Ask your health care provider for help if you need support or information about quitting drugs.  Tell your health care provider if you often feel depressed.  Tell your health care provider if you have ever been abused or do not feel safe at home. This information is not intended to replace advice given to you by your health care provider. Make sure you discuss any questions you have with your health care provider. Document Released: 09/09/2010 Document Revised: 08/02/2015 Document Reviewed: 11/28/2014 Elsevier Interactive Patient Education  2018 [place] Maintenance for Postmenopausal Women Menopause is a normal process in which your reproductive ability comes to an end. This process happens gradually over a span of months to years, usually between the ages of 80 and 30. Menopause is complete when you have missed 12 consecutive menstrual periods. It is important to talk with your health care provider about some of the most common conditions that affect postmenopausal women, such as heart disease, cancer, and bone loss (osteoporosis). Adopting a healthy lifestyle and getting preventive care can help to promote your health and wellness. Those actions can also lower your chances of developing some of these common conditions. What should I know about menopause? During menopause, you may experience  a number of symptoms, such as:  Moderate-to-severe hot flashes.  Night sweats.  Decrease in sex drive.  Mood swings.  Headaches.  Tiredness.  Irritability.  Memory problems.  Insomnia.  Choosing to treat or not  to treat menopausal changes is an individual decision that you make with your health care provider. What should I know about hormone replacement therapy and supplements? Hormone therapy products are effective for treating symptoms that are associated with menopause, such as hot flashes and night sweats. Hormone replacement carries certain risks, especially as you become older. If you are thinking about using estrogen or estrogen with progestin treatments, discuss the benefits and risks with your health care provider. What should I know about heart disease and stroke? Heart disease, heart attack, and stroke become more likely as you age. This may be due, in part, to the hormonal changes that your body experiences during menopause. These can affect how your body processes dietary fats, triglycerides, and cholesterol. Heart attack and stroke are both medical emergencies. There are many things that you can do to help prevent heart disease and stroke:  Have your blood pressure checked at least every 1-2 years. High blood pressure causes heart disease and increases the risk of stroke.  If you are 7-78 years old, ask your health care provider if you should take aspirin to prevent a heart attack or a stroke.  Do not use any tobacco products, including cigarettes, chewing tobacco, or electronic cigarettes. If you need help quitting, ask your health care provider.  It is important to eat a healthy diet and maintain a healthy weight. ? Be sure to include plenty of vegetables, fruits, low-fat dairy products, and lean protein. ? Avoid eating foods that are high in solid fats, added sugars, or salt (sodium).  Get regular exercise. This is one of the most important things that you can  do for your health. ? Try to exercise for at least 150 minutes each week. The type of exercise that you do should increase your heart rate and make you sweat. This is known as moderate-intensity exercise. ? Try to do strengthening exercises at least twice each week. Do these in addition to the moderate-intensity exercise.  Know your numbers.Ask your health care provider to check your cholesterol and your blood glucose. Continue to have your blood tested as directed by your health care provider.  What should I know about cancer screening? There are several types of cancer. Take the following steps to reduce your risk and to catch any cancer development as early as possible. Breast Cancer  Practice breast self-awareness. ? This means understanding how your breasts normally appear and feel. ? It also means doing regular breast self-exams. Let your health care provider know about any changes, no matter how small.  If you are 58 or older, have a clinician do a breast exam (clinical breast exam or CBE) every year. Depending on your age, family history, and medical history, it may be recommended that you also have a yearly breast X-ray (mammogram).  If you have a family history of breast cancer, talk with your health care provider about genetic screening.  If you are at high risk for breast cancer, talk with your health care provider about having an MRI and a mammogram every year.  Breast cancer (BRCA) gene test is recommended for women who have family members with BRCA-related cancers. Results of the assessment will determine the need for genetic counseling and BRCA1 and for BRCA2 testing. BRCA-related cancers include these types: ? Breast. This occurs in males or females. ? Ovarian. ? Tubal. This may also be called fallopian tube cancer. ? Cancer of the abdominal or pelvic lining (peritoneal cancer). ? Prostate. ? Pancreatic.  Cervical, Uterine,  and Ovarian Cancer Your health care provider  may recommend that you be screened regularly for cancer of the pelvic organs. These include your ovaries, uterus, and vagina. This screening involves a pelvic exam, which includes checking for microscopic changes to the surface of your cervix (Pap test).  For women ages 21-65, health care providers may recommend a pelvic exam and a Pap test every three years. For women ages 22-65, they may recommend the Pap test and pelvic exam, combined with testing for human papilloma virus (HPV), every five years. Some types of HPV increase your risk of cervical cancer. Testing for HPV may also be done on women of any age who have unclear Pap test results.  Other health care providers may not recommend any screening for nonpregnant women who are considered low risk for pelvic cancer and have no symptoms. Ask your health care provider if a screening pelvic exam is right for you.  If you have had past treatment for cervical cancer or a condition that could lead to cancer, you need Pap tests and screening for cancer for at least 20 years after your treatment. If Pap tests have been discontinued for you, your risk factors (such as having a new sexual partner) need to be reassessed to determine if you should start having screenings again. Some women have medical problems that increase the chance of getting cervical cancer. In these cases, your health care provider may recommend that you have screening and Pap tests more often.  If you have a family history of uterine cancer or ovarian cancer, talk with your health care provider about genetic screening.  If you have vaginal bleeding after reaching menopause, tell your health care provider.  There are currently no reliable tests available to screen for ovarian cancer.  Lung Cancer Lung cancer screening is recommended for adults 46-72 years old who are at high risk for lung cancer because of a history of smoking. A yearly low-dose CT scan of the lungs is recommended if  you:  Currently smoke.  Have a history of at least 30 pack-years of smoking and you currently smoke or have quit within the past 15 years. A pack-year is smoking an average of one pack of cigarettes per day for one year.  Yearly screening should:  Continue until it has been 15 years since you quit.  Stop if you develop a health problem that would prevent you from having lung cancer treatment.  Colorectal Cancer  This type of cancer can be detected and can often be prevented.  Routine colorectal cancer screening usually begins at age 38 and continues through age 60.  If you have risk factors for colon cancer, your health care provider may recommend that you be screened at an earlier age.  If you have a family history of colorectal cancer, talk with your health care provider about genetic screening.  Your health care provider may also recommend using home test kits to check for hidden blood in your stool.  A small camera at the end of a tube can be used to examine your colon directly (sigmoidoscopy or colonoscopy). This is done to check for the earliest forms of colorectal cancer.  Direct examination of the colon should be repeated every 5-10 years until age 77. However, if early forms of precancerous polyps or small growths are found or if you have a family history or genetic risk for colorectal cancer, you may need to be screened more often.  Skin Cancer  Check your skin from  head to toe regularly.  Monitor any moles. Be sure to tell your health care provider: ? About any new moles or changes in moles, especially if there is a change in a mole's shape or color. ? If you have a mole that is larger than the size of a pencil eraser.  If any of your family members has a history of skin cancer, especially at a young age, talk with your health care provider about genetic screening.  Always use sunscreen. Apply sunscreen liberally and repeatedly throughout the day.  Whenever you are  outside, protect yourself by wearing long sleeves, pants, a wide-brimmed hat, and sunglasses.  What should I know about osteoporosis? Osteoporosis is a condition in which bone destruction happens more quickly than new bone creation. After menopause, you may be at an increased risk for osteoporosis. To help prevent osteoporosis or the bone fractures that can happen because of osteoporosis, the following is recommended:  If you are 79-73 years old, get at least 1,000 mg of calcium and at least 600 mg of vitamin D per day.  If you are older than age 50 but younger than age 22, get at least 1,200 mg of calcium and at least 600 mg of vitamin D per day.  If you are older than age 61, get at least 1,200 mg of calcium and at least 800 mg of vitamin D per day.  Smoking and excessive alcohol intake increase the risk of osteoporosis. Eat foods that are rich in calcium and vitamin D, and do weight-bearing exercises several times each week as directed by your health care provider. What should I know about how menopause affects my mental health? Depression may occur at any age, but it is more common as you become older. Common symptoms of depression include:  Low or sad mood.  Changes in sleep patterns.  Changes in appetite or eating patterns.  Feeling an overall lack of motivation or enjoyment of activities that you previously enjoyed.  Frequent crying spells.  Talk with your health care provider if you think that you are experiencing depression. What should I know about immunizations? It is important that you get and maintain your immunizations. These include:  Tetanus, diphtheria, and pertussis (Tdap) booster vaccine.  Influenza every year before the flu season begins.  Pneumonia vaccine.  Shingles vaccine.  Your health care provider may also recommend other immunizations. This information is not intended to replace advice given to you by your health care provider. Make sure you discuss  any questions you have with your health care provider. Document Released: 04/18/2005 Document Revised: 09/14/2015 Document Reviewed: 11/28/2014 Elsevier Interactive Patient Education  2018 [place]. Influenza (Flu) Vaccine (Inactivated or Recombinant): What You Need to Know 1. Why get vaccinated? Influenza ("flu") is a contagious disease that spreads around the [place] every year, usually between October and May. Flu is caused by influenza viruses, and is spread mainly by coughing, sneezing, and close contact. Anyone can get flu. Flu strikes suddenly and can last several days. Symptoms vary by age, but can include:  fever/chills  sore throat  muscle aches  fatigue  cough  headache  runny or stuffy nose  Flu can also lead to pneumonia and blood infections, and cause diarrhea and seizures in children. If you have a medical condition, such as heart or lung disease, flu can make it worse. Flu is more dangerous for some people. Infants and young children, people 59 years of age and older, pregnant women, and people  with certain health conditions or a weakened immune system are at greatest risk. Each year thousands of people in the [place] die from flu, and many more are hospitalized. Flu vaccine can:  keep you from getting flu,  make flu less severe if you do get it, and  keep you from spreading flu to your family and other people. 2. Inactivated and recombinant flu vaccines A dose of flu vaccine is recommended every flu season. Children 6 months through 61 years of age may need two doses during the same flu season. Everyone else needs only one dose each flu season. Some inactivated flu vaccines contain a very small amount of a mercury-based preservative called thimerosal. Studies have not shown thimerosal in vaccines to be harmful, but flu vaccines that do not contain thimerosal are available. There is no live flu virus in flu shots. They cannot cause the flu. There  are many flu viruses, and they are always changing. Each year a new flu vaccine is made to protect against three or four viruses that are likely to cause disease in the upcoming flu season. But even when the vaccine doesn't exactly match these viruses, it may still provide some protection. Flu vaccine cannot prevent:  flu that is caused by a virus not covered by the vaccine, or  illnesses that look like flu but are not.  It takes about 2 weeks for protection to develop after vaccination, and protection lasts through the flu season. 3. Some people should not get this vaccine Tell the person who is giving you the vaccine:  If you have any severe, life-threatening allergies. If you ever had a life-threatening allergic reaction after a dose of flu vaccine, or have a severe allergy to any part of this vaccine, you may be advised not to get vaccinated. Most, but not all, types of flu vaccine contain a small amount of egg protein.  If you ever had Guillain-Barr Syndrome (also called GBS). Some people with a history of GBS should not get this vaccine. This should be discussed with your doctor.  If you are not feeling well. It is usually okay to get flu vaccine when you have a mild illness, but you might be asked to come back when you feel better.  4. Risks of a vaccine reaction With any medicine, including vaccines, there is a chance of reactions. These are usually mild and go away on their own, but serious reactions are also possible. Most people who get a flu shot do not have any problems with it. Minor problems following a flu shot include:  soreness, redness, or swelling where the shot was given  hoarseness  sore, red or itchy eyes  cough  fever  aches  headache  itching  fatigue  If these problems occur, they usually begin soon after the shot and last 1 or 2 days. More serious problems following a flu shot can include the following:  There may be a small increased risk of  Guillain-Barre Syndrome (GBS) after inactivated flu vaccine. This risk has been estimated at 1 or 2 additional cases per million people vaccinated. This is much lower than the risk of severe complications from flu, which can be prevented by flu vaccine.  Young children who get the flu shot along with pneumococcal vaccine (PCV13) and/or DTaP vaccine at the same time might be slightly more likely to have a seizure caused by fever. Ask your doctor for more information. Tell your doctor if a child who is getting flu  vaccine has ever had a seizure.  Problems that could happen after any injected vaccine:  People sometimes faint after a medical procedure, including vaccination. Sitting or lying down for about 15 minutes can help prevent fainting, and injuries caused by a fall. Tell your doctor if you feel dizzy, or have vision changes or ringing in the ears.  Some people get severe pain in the shoulder and have difficulty moving the arm where a shot was given. This happens very rarely.  Any medication can cause a severe allergic reaction. Such reactions from a vaccine are very rare, estimated at about 1 in a million doses, and would happen within a few minutes to a few hours after the vaccination. As with any medicine, there is a very remote chance of a vaccine causing a serious injury or death. The safety of vaccines is always being monitored. For more information, visit: http://www.aguilar.org/ 5. What if there is a serious reaction? What should I look for? Look for anything that concerns you, such as signs of a severe allergic reaction, very high fever, or unusual behavior. Signs of a severe allergic reaction can include hives, swelling of the face and throat, difficulty breathing, a fast heartbeat, dizziness, and weakness. These would start a few minutes to a few hours after the vaccination. What should I do?  If you think it is a severe allergic reaction or other emergency that can't wait, call  9-1-1 and get the person to the nearest hospital. Otherwise, call your doctor.  Reactions should be reported to the Vaccine Adverse Event Reporting System (VAERS). Your doctor should file this report, or you can do it yourself through the VAERS web site at www.vaers.SamedayNews.es, or by calling 224-304-9769. ? VAERS does not give medical advice. 6. The National Vaccine Injury Compensation Program The [employer] Vaccine Injury Compensation Program (VICP) is a federal program that was created to compensate people who may have been injured by certain vaccines. Persons who believe they may have been injured by a vaccine can learn about the program and about filing a claim by calling (770)185-1704 or visiting the [place] website at GoldCloset.com.ee. There is a time limit to file a claim for compensation. 7. How can I learn more?  Ask your healthcare provider. He or she can give you the vaccine package insert or suggest other sources of information.  Call your local or state health department.  Contact the Centers for Disease Control and Prevention (CDC): ? Call (763)709-8355 (1-800-CDC-INFO) or ? Visit CDC's website at https://gibson.com/ Vaccine Information Statement, Inactivated Influenza Vaccine (10/14/2013) This information is not intended to replace advice given to you by your health care provider. Make sure you discuss any questions you have with your health care provider. Document Released: 12/19/2005 Document Revised: 11/15/2015 Document Reviewed: 11/15/2015 Elsevier Interactive Patient Education  2017 [employer].

## 2017-01-13 LAB — COLOGUARD: COLOGUARD: NEGATIVE

## 2017-01-24 DIAGNOSIS — Z1212 Encounter for screening for malignant neoplasm of rectum: Secondary | ICD-10-CM | POA: Diagnosis not present

## 2017-01-24 DIAGNOSIS — Z1211 Encounter for screening for malignant neoplasm of colon: Secondary | ICD-10-CM | POA: Diagnosis not present

## 2017-01-26 DIAGNOSIS — H2513 Age-related nuclear cataract, bilateral: Secondary | ICD-10-CM | POA: Diagnosis not present

## 2017-02-26 ENCOUNTER — Encounter: Payer: Self-pay | Admitting: Family Medicine

## 2017-02-26 ENCOUNTER — Ambulatory Visit (INDEPENDENT_AMBULATORY_CARE_PROVIDER_SITE_OTHER): Payer: PPO | Admitting: Family Medicine

## 2017-02-26 VITALS — BP 174/72 | HR 70 | Temp 98.4°F | Wt 162.0 lb

## 2017-02-26 DIAGNOSIS — J0100 Acute maxillary sinusitis, unspecified: Secondary | ICD-10-CM | POA: Diagnosis not present

## 2017-02-26 MED ORDER — AMOXICILLIN-POT CLAVULANATE 875-125 MG PO TABS: 1.0000 | ORAL_TABLET | Freq: Two times a day (BID) | ORAL | 0 refills | Status: DC

## 2017-02-26 NOTE — Progress Notes (Signed)
   BP (!) 174/72   Pulse 70   Temp 98.4 F (36.9 C) (Oral)   Wt 162 lb (73.5 kg)   SpO2 96%   BMI 31.22 kg/m    Subjective:    Patient ID: Vanessa Casey, female    DOB: Feb 28, 1951, 66 y.o.   MRN: [id number]  HPI: Vanessa Casey is a 66 y.o. female  Chief Complaint  Patient presents with  . Cough    x 1 week - pt is improving.   . Nasal Congestion   Congestion, facial pain and pressure, productive cough, fatigue x 1 week. Denies fever, chills, body aches, CP. Sick contact at work. Taking cough syrups and advil cold and sinus.   Relevant past medical, surgical, family and social history reviewed and updated as indicated. Interim medical history since our last visit reviewed. Allergies and medications reviewed and updated.  Review of Systems  Constitutional: Positive for fatigue.  HENT: Positive for congestion, sinus pressure and sinus pain.   Respiratory: Positive for cough.   Cardiovascular: Negative.   Gastrointestinal: Negative.   Genitourinary: Negative.   Musculoskeletal: Negative.   Neurological: Negative.   Psychiatric/Behavioral: Negative.    Per HPI unless specifically indicated above     Objective:    BP (!) 174/72   Pulse 70   Temp 98.4 F (36.9 C) (Oral)   Wt 162 lb (73.5 kg)   SpO2 96%   BMI 31.22 kg/m   Wt Readings from Last 3 Encounters:  02/26/17 162 lb (73.5 kg)  01/12/17 162 lb 1 oz (73.5 kg)  12/11/16 160 lb (72.6 kg)    Physical Exam  Constitutional: She is oriented to person, place, and time. She appears well-developed and well-nourished. No distress.  HENT:  Head: Atraumatic.  Right Ear: External ear normal.  Left Ear: External ear normal.  Maxillary sinuses ttp Oropharynx and nasal mucosa erythematous Thick discharge present in nares  Eyes: Conjunctivae are normal. Pupils are equal, round, and reactive to light. No scleral icterus.  Neck: Normal range of motion. Neck supple.  Pulmonary/Chest: Effort normal and breath sounds normal.  No respiratory distress.  Musculoskeletal: Normal range of motion.  Neurological: She is alert and oriented to person, place, and time.  Skin: Skin is warm and dry.  Psychiatric: She has a normal mood and affect. Her behavior is normal.  Nursing note and vitals reviewed.     Assessment & Plan:   Problem List Items Addressed This Visit    None    Visit Diagnoses    Acute maxillary sinusitis, recurrence not specified    -  Primary   Will treat with augmentin, OTC cough suppressants, sinus rinses, humidifier, rest, good hydration. F/u if worsening or no improvement   Relevant Medications   amoxicillin-clavulanate (AUGMENTIN) 875-125 MG tablet       Follow up plan: Return for as scheduled.

## 2017-03-01 NOTE — Patient Instructions (Signed)
Follow up as needed

## 2017-03-11 DIAGNOSIS — H40003 Preglaucoma, unspecified, bilateral: Secondary | ICD-10-CM | POA: Diagnosis not present

## 2017-04-22 DIAGNOSIS — H2511 Age-related nuclear cataract, right eye: Secondary | ICD-10-CM | POA: Diagnosis not present

## 2017-04-28 ENCOUNTER — Ambulatory Visit: Payer: PPO | Admitting: Family Medicine

## 2017-04-28 ENCOUNTER — Encounter: Payer: Self-pay | Admitting: *Deleted

## 2017-04-30 ENCOUNTER — Ambulatory Visit (INDEPENDENT_AMBULATORY_CARE_PROVIDER_SITE_OTHER): Payer: PPO | Admitting: Family Medicine

## 2017-04-30 ENCOUNTER — Encounter: Payer: Self-pay | Admitting: Family Medicine

## 2017-04-30 VITALS — BP 142/58 | HR 67 | Temp 98.8°F | Wt 164.1 lb

## 2017-04-30 DIAGNOSIS — Z01818 Encounter for other preprocedural examination: Secondary | ICD-10-CM | POA: Diagnosis not present

## 2017-04-30 DIAGNOSIS — R112 Nausea with vomiting, unspecified: Secondary | ICD-10-CM

## 2017-04-30 DIAGNOSIS — M25511 Pain in right shoulder: Secondary | ICD-10-CM | POA: Diagnosis not present

## 2017-04-30 DIAGNOSIS — I499 Cardiac arrhythmia, unspecified: Secondary | ICD-10-CM

## 2017-04-30 DIAGNOSIS — Z9889 Other specified postprocedural states: Secondary | ICD-10-CM | POA: Diagnosis not present

## 2017-04-30 HISTORY — DX: Other specified postprocedural states: Z98.890

## 2017-04-30 HISTORY — DX: Nausea with vomiting, unspecified: R11.2

## 2017-04-30 LAB — COAGUCHEK XS/INR WAIVED
INR: 1 (ref 0.9–1.1)
Prothrombin Time: 12 s

## 2017-04-30 NOTE — Assessment & Plan Note (Signed)
Will make sure she lets her surgeon know when she has her cataract surgery, unlikely to be an issue with this surgery.

## 2017-04-30 NOTE — Progress Notes (Addendum)
BP (!) 142/58 (BP Location: Left Arm, Cuff Size: Normal)   Pulse 67   Temp 98.8 F (37.1 C)   Wt 164 lb 2 oz (74.4 kg)   SpO2 97%   BMI 32.05 kg/m    Subjective:    Patient ID: Vanessa Casey, female    DOB: Jun 15, 1950, 67 y.o.   MRN: [id number]  HPI: Vanessa Casey is a 67 y.o. female  Chief Complaint  Patient presents with  . surgical clearance   Here today for surgical clearance for cataract surgery. Went to see [facility] and they noted an irregular heart beat on exam. EKG done at her [facility] in November was normal except for bradycardia. EKG today was normal with normal sinus rhythm.   Has not noticed any Symptom description: some mild pain in the center of her chest and R arm Duration of episode: minutes Frequency: rarely, usually when she is using her R arm Activity when event occurred: having used the R arm Related to exertion: no Dyspnea: no Chest pain: no Syncope: no Anxiety/stress: no Nausea/vomiting: no Diaphoresis: no Coronary artery disease: no Congestive heart failure: no Arrhythmia:no Thyroid disease: no Caffeine intake: Heavy caffeine consumption- had some diet doctor pepper before going and she was rushing to get there Status:  Has not felt anything since Treatments attempted:none  Had hysterectomy. Had pretty severe post-operative nausea and vomiting. No history of malignant hyperthermia. No problems with intubation or extubation in the past. No issues with family history of problems with anesthesia.   Relevant past medical, surgical, family and social history reviewed and updated as indicated. Interim medical history since our last visit reviewed. Allergies and medications reviewed and updated.  Review of Systems  Constitutional: Negative.   HENT: Negative.   Respiratory: Negative.   Cardiovascular: Negative.   Gastrointestinal: Negative.   Genitourinary: Negative.   Neurological: Negative.   Psychiatric/Behavioral: Negative.      Per HPI unless specifically indicated above     Objective:    BP (!) 142/58 (BP Location: Left Arm, Cuff Size: Normal)   Pulse 67   Temp 98.8 F (37.1 C)   Wt 164 lb 2 oz (74.4 kg)   SpO2 97%   BMI 32.05 kg/m   Wt Readings from Last 3 Encounters:  04/30/17 164 lb 2 oz (74.4 kg)  02/26/17 162 lb (73.5 kg)  01/12/17 162 lb 1 oz (73.5 kg)    Physical Exam  Constitutional: She is oriented to person, place, and time. She appears well-developed and well-nourished. No distress.  HENT:  Head: Normocephalic and atraumatic.  Right Ear: Hearing and external ear normal.  Left Ear: Hearing and external ear normal.  Nose: Nose normal.  Mouth/Throat: Oropharynx is clear and moist. No oropharyngeal exudate.  Eyes: Conjunctivae, EOM and lids are normal. Pupils are equal, round, and reactive to light. Right eye exhibits no discharge. Left eye exhibits no discharge. No scleral icterus.  Neck: Normal range of motion. Neck supple. No JVD present. No tracheal deviation present. No thyromegaly present.  Cardiovascular: Normal rate, regular rhythm, normal heart sounds and intact distal pulses. Exam reveals no gallop and no friction rub.  No murmur heard. Pulmonary/Chest: Effort normal and breath sounds normal. No stridor. No respiratory distress. She has no wheezes. She has no rales. She exhibits no tenderness.  Musculoskeletal: Normal range of motion. She exhibits tenderness (R pec major). She exhibits no edema or deformity.  Lymphadenopathy:    She has no cervical adenopathy.  Neurological: She  is alert and oriented to person, place, and time. She has normal reflexes. She displays normal reflexes. No cranial nerve deficit. She exhibits normal muscle tone. Coordination normal.  Skin: Skin is warm, dry and intact. No rash noted. She is not diaphoretic. No erythema. No pallor.  Psychiatric: She has a normal mood and affect. Her speech is normal and behavior is normal. Judgment and thought content  normal. Cognition and memory are normal.  Nursing note and vitals reviewed.   Results for orders placed or performed in visit on 12/11/16  CBC with Differential/Platelet  Result Value Ref Range   WBC 5.0 3.4 - 10.8 x10E3/uL   RBC 4.36 3.77 - 5.28 x10E6/uL   Hemoglobin 11.9 11.1 - 15.9 g/dL   Hematocrit 35.0 34.0 - 46.6 %   MCV 80 79 - 97 fL   MCH 27.3 26.6 - 33.0 pg   MCHC 34.0 31.5 - 35.7 g/dL   RDW 14.6 12.3 - 15.4 %   Platelets 249 150 - 379 x10E3/uL   Neutrophils 69 Not Estab. %   Lymphs 22 Not Estab. %   Monocytes 8 Not Estab. %   Eos 1 Not Estab. %   Basos 0 Not Estab. %   Neutrophils Absolute 3.5 1.4 - 7.0 x10E3/uL   Lymphocytes Absolute 1.1 0.7 - 3.1 x10E3/uL   Monocytes Absolute 0.4 0.1 - 0.9 x10E3/uL   EOS (ABSOLUTE) 0.1 0.0 - 0.4 x10E3/uL   Basophils Absolute 0.0 0.0 - 0.2 x10E3/uL   Immature Granulocytes 0 Not Estab. %   Immature Grans (Abs) 0.0 0.0 - 0.1 x10E3/uL  Comprehensive metabolic panel  Result Value Ref Range   Glucose 86 65 - 99 mg/dL   BUN 17 8 - 27 mg/dL   Creatinine, Ser 0.99 0.57 - 1.00 mg/dL   GFR calc non Af Amer 60 >59 mL/min/1.73   GFR calc Af Amer 70 >59 mL/min/1.73   BUN/Creatinine Ratio 17 12 - 28   Sodium 141 134 - 144 mmol/L   Potassium 3.9 3.5 - 5.2 mmol/L   Chloride 104 96 - 106 mmol/L   CO2 22 20 - 29 mmol/L   Calcium 9.2 8.7 - 10.3 mg/dL   Total Protein 7.3 6.0 - 8.5 g/dL   Albumin 4.1 3.6 - 4.8 g/dL   Globulin, Total 3.2 1.5 - 4.5 g/dL   Albumin/Globulin Ratio 1.3 1.2 - 2.2   Bilirubin Total 0.2 0.0 - 1.2 mg/dL   Alkaline Phosphatase 96 39 - 117 IU/L   AST 22 0 - 40 IU/L   ALT 15 0 - 32 IU/L  Lipid Panel w/o Chol/HDL Ratio  Result Value Ref Range   Cholesterol, Total 186 100 - 199 mg/dL   Triglycerides 312 (H) 0 - 149 mg/dL   HDL 45 >39 mg/dL   VLDL Cholesterol Cal 62 (H) 5 - 40 mg/dL   LDL Calculated 79 0 - 99 mg/dL  TSH  Result Value Ref Range   TSH 3.460 0.450 - 4.500 uIU/mL  Hgb A1c w/o eAG  Result Value Ref Range    Hgb A1c MFr Bld 5.7 (H) 4.8 - 5.6 %  Hepatitis C Antibody  Result Value Ref Range   Hep C Virus Ab <0.1 0.0 - 0.9 s/co ratio  HIV antibody  Result Value Ref Range   HIV Screen 4th Generation wRfx Non Reactive Non Reactive      Assessment & Plan:   Problem List Items Addressed This Visit      Digestive   Post-operative nausea  and vomiting    Will make sure she lets her surgeon know when she has her cataract surgery, unlikely to be an issue with this surgery.       Other Visit Diagnoses    Preoperative general physical examination    -  Primary   EKG normal. Checking labs. Should be cleared pending lab results.    Relevant Orders   CoaguChek XS/INR Waived   CBC with Differential/Platelet   Comprehensive metabolic panel   Irregular heart beat       Likely due to PVC- normal EKG, will check labs. No concerns.    Relevant Orders   EKG 12-Lead (Completed)   Acute pain of right shoulder       Due to pec major spasm. Will treat with exercises. Call with any concerns.        Follow up plan: Return As scheduled.   Addendum 2/22/219 11:30AM: Labs normal. Cleared for surgery.

## 2017-05-01 ENCOUNTER — Encounter: Payer: Self-pay | Admitting: Family Medicine

## 2017-05-01 LAB — COMPREHENSIVE METABOLIC PANEL
A/G RATIO: 1.4 (ref 1.2–2.2)
ALT: 15 IU/L (ref 0–32)
AST: 18 IU/L (ref 0–40)
Albumin: 4.4 g/dL (ref 3.6–4.8)
Alkaline Phosphatase: 100 IU/L (ref 39–117)
BUN/Creatinine Ratio: 22 (ref 12–28)
BUN: 16 mg/dL (ref 8–27)
Bilirubin Total: 0.3 mg/dL (ref 0.0–1.2)
CO2: 22 mmol/L (ref 20–29)
CREATININE: 0.74 mg/dL (ref 0.57–1.00)
Calcium: 9.2 mg/dL (ref 8.7–10.3)
Chloride: 102 mmol/L (ref 96–106)
GFR calc non Af Amer: 85 mL/min/{1.73_m2} (ref >59)
GFR, EST AFRICAN AMERICAN: 98 mL/min/{1.73_m2} (ref >59)
GLOBULIN, TOTAL: 3.1 g/dL (ref 1.5–4.5)
Glucose: 77 mg/dL (ref 65–99)
POTASSIUM: 3.8 mmol/L (ref 3.5–5.2)
SODIUM: 141 mmol/L (ref 134–144)
TOTAL PROTEIN: 7.5 g/dL (ref 6.0–8.5)

## 2017-05-01 LAB — CBC WITH DIFFERENTIAL/PLATELET
Basophils Absolute: 0 10*3/uL (ref 0.0–0.2)
Basos: 0 %
EOS (ABSOLUTE): 0.1 10*3/uL (ref 0.0–0.4)
EOS: 1 %
HEMATOCRIT: 36.5 % (ref 34.0–46.6)
Hemoglobin: 12.1 g/dL (ref 11.1–15.9)
IMMATURE GRANS (ABS): 0 10*3/uL (ref 0.0–0.1)
IMMATURE GRANULOCYTES: 0 %
LYMPHS: 22 %
Lymphocytes Absolute: 1.3 10*3/uL (ref 0.7–3.1)
MCH: 26.8 pg (ref 26.6–33.0)
MCHC: 33.2 g/dL (ref 31.5–35.7)
MCV: 81 fL (ref 79–97)
MONOCYTES: 10 %
Monocytes Absolute: 0.6 10*3/uL (ref 0.1–0.9)
NEUTROS PCT: 67 %
Neutrophils Absolute: 4 10*3/uL (ref 1.4–7.0)
Platelets: 248 10*3/uL (ref 150–379)
RBC: 4.51 x10E6/uL (ref 3.77–5.28)
RDW: 14.1 % (ref 12.3–15.4)
WBC: 5.9 10*3/uL (ref 3.4–10.8)

## 2017-05-05 ENCOUNTER — Ambulatory Visit: Payer: PPO | Admitting: Anesthesiology

## 2017-05-05 ENCOUNTER — Encounter
Admission: RE | Disposition: A | Discharge status: Home / Self Care | Payer: Self-pay | Source: Ambulatory Visit | Attending: Ophthalmology

## 2017-05-05 ENCOUNTER — Ambulatory Visit
Admission: RE | Admit: 2017-05-05 | Discharge: 2017-05-05 | Disposition: A | Discharge status: Home / Self Care | Payer: PPO | Source: Ambulatory Visit | Attending: Ophthalmology | Admitting: Ophthalmology

## 2017-05-05 ENCOUNTER — Encounter: Payer: Self-pay | Admitting: *Deleted

## 2017-05-05 ENCOUNTER — Other Ambulatory Visit: Payer: Self-pay

## 2017-05-05 DIAGNOSIS — Z8619 Personal history of other infectious and parasitic diseases: Secondary | ICD-10-CM | POA: Diagnosis not present

## 2017-05-05 DIAGNOSIS — H2511 Age-related nuclear cataract, right eye: Secondary | ICD-10-CM | POA: Diagnosis not present

## 2017-05-05 HISTORY — DX: Other specified health status: Z78.9

## 2017-05-05 HISTORY — PX: CATARACT EXTRACTION W/PHACO: SHX586

## 2017-05-05 HISTORY — DX: Edema, unspecified: R60.9

## 2017-05-05 SURGERY — PHACOEMULSIFICATION, CATARACT, WITH IOL INSERTION
Anesthesia: Monitor Anesthesia Care | Site: Eye | Laterality: Right | Wound class: Clean

## 2017-05-05 MED ORDER — CARBACHOL 0.01 % IO SOLN
INTRAOCULAR | Status: DC | PRN
  Administered 2017-05-05: 0.5 mL via INTRAOCULAR

## 2017-05-05 MED ORDER — LIDOCAINE HCL (PF) 4 % IJ SOLN
INTRAOCULAR | Status: DC | PRN
  Administered 2017-05-05: 4 mL via OPHTHALMIC

## 2017-05-05 MED ORDER — LIDOCAINE HCL (PF) 4 % IJ SOLN
INTRAMUSCULAR | Status: AC
  Filled 2017-05-05: qty 5

## 2017-05-05 MED ORDER — MOXIFLOXACIN HCL 0.5 % OP SOLN
OPHTHALMIC | Status: DC | PRN
  Administered 2017-05-05: 0.2 mL via OPHTHALMIC

## 2017-05-05 MED ORDER — NA CHONDROIT SULF-NA HYALURON 40-17 MG/ML IO SOLN
INTRAOCULAR | Status: AC
  Filled 2017-05-05: qty 1

## 2017-05-05 MED ORDER — EPINEPHRINE PF 1 MG/ML IJ SOLN
INTRAOCULAR | Status: DC | PRN
  Administered 2017-05-05: 08:00:00 via OPHTHALMIC

## 2017-05-05 MED ORDER — MOXIFLOXACIN HCL 0.5 % OP SOLN: 1.0000 [drp] | OPHTHALMIC | Status: DC | PRN

## 2017-05-05 MED ORDER — POVIDONE-IODINE 5 % OP SOLN
OPHTHALMIC | Status: AC
  Filled 2017-05-05: qty 30

## 2017-05-05 MED ORDER — MOXIFLOXACIN HCL 0.5 % OP SOLN
OPHTHALMIC | Status: AC
  Filled 2017-05-05: qty 3

## 2017-05-05 MED ORDER — MIDAZOLAM HCL 2 MG/2ML IJ SOLN
INTRAMUSCULAR | Status: AC
  Filled 2017-05-05: qty 2

## 2017-05-05 MED ORDER — ARMC OPHTHALMIC DILATING DROPS
OPHTHALMIC | Status: AC
  Filled 2017-05-05: qty 0.4

## 2017-05-05 MED ORDER — NA CHONDROIT SULF-NA HYALURON 40-17 MG/ML IO SOLN
INTRAOCULAR | Status: DC | PRN
  Administered 2017-05-05: 1 mL via INTRAOCULAR

## 2017-05-05 MED ORDER — MIDAZOLAM HCL 2 MG/2ML IJ SOLN
INTRAMUSCULAR | Status: DC | PRN
  Administered 2017-05-05 (×2): 0.5 mg via INTRAVENOUS
  Administered 2017-05-05: 1 mg via INTRAVENOUS

## 2017-05-05 MED ORDER — POVIDONE-IODINE 5 % OP SOLN
OPHTHALMIC | Status: DC | PRN
  Administered 2017-05-05: 1 via OPHTHALMIC

## 2017-05-05 MED ORDER — EPINEPHRINE PF 1 MG/ML IJ SOLN
INTRAMUSCULAR | Status: AC
  Filled 2017-05-05: qty 2

## 2017-05-05 MED ORDER — FENTANYL CITRATE (PF) 100 MCG/2ML IJ SOLN
INTRAMUSCULAR | Status: DC | PRN
  Administered 2017-05-05: 50 ug via INTRAVENOUS

## 2017-05-05 MED ORDER — FENTANYL CITRATE (PF) 100 MCG/2ML IJ SOLN
INTRAMUSCULAR | Status: AC
  Filled 2017-05-05: qty 2

## 2017-05-05 MED ORDER — ARMC OPHTHALMIC DILATING DROPS
1.0000 "application " | OPHTHALMIC | Status: AC
  Administered 2017-05-05 (×2): 1 via OPHTHALMIC

## 2017-05-05 MED ORDER — SODIUM CHLORIDE 0.9 % IV SOLN
INTRAVENOUS | Status: DC
  Administered 2017-05-05: 07:00:00 via INTRAVENOUS

## 2017-05-05 SURGICAL SUPPLY — 16 items
GLOVE BIO SURGEON STRL SZ8 (GLOVE) ×2 IMPLANT
GLOVE BIOGEL M 6.5 STRL (GLOVE) ×2 IMPLANT
GLOVE SURG LX 8.0 MICRO (GLOVE) ×1
GLOVE SURG LX STRL 8.0 MICRO (GLOVE) ×1 IMPLANT
GOWN STRL REUS W/ TWL LRG LVL3 (GOWN DISPOSABLE) ×2 IMPLANT
GOWN STRL REUS W/TWL LRG LVL3 (GOWN DISPOSABLE) ×2
LABEL CATARACT MEDS ST (LABEL) ×2 IMPLANT
LENS IOL TECNIS ITEC 19.0 (Intraocular Lens) ×2 IMPLANT
PACK CATARACT (MISCELLANEOUS) ×2 IMPLANT
PACK CATARACT BRASINGTON LX (MISCELLANEOUS) ×2 IMPLANT
PACK EYE AFTER SURG (MISCELLANEOUS) ×2 IMPLANT
SOL BSS BAG (MISCELLANEOUS) ×2
SOLUTION BSS BAG (MISCELLANEOUS) ×1 IMPLANT
SYR 5ML LL (SYRINGE) ×2 IMPLANT
WATER STERILE IRR 250ML POUR (IV SOLUTION) ×2 IMPLANT
WIPE NON LINTING 3.25X3.25 (MISCELLANEOUS) ×2 IMPLANT

## 2017-05-05 NOTE — Anesthesia Postprocedure Evaluation (Signed)
Anesthesia Post Note  Patient: Vanessa Casey  Procedure(s) Performed: CATARACT EXTRACTION PHACO AND INTRAOCULAR LENS PLACEMENT ([place]) (Right Eye)  Patient location during evaluation: Short Stay Anesthesia Type: MAC Level of consciousness: awake and awake and alert Pain management: pain level controlled Vital Signs Assessment: post-procedure vital signs reviewed and stable Respiratory status: spontaneous breathing Cardiovascular status: blood pressure returned to baseline Postop Assessment: no headache Anesthetic complications: no     Last Vitals:  Vitals:   05/05/17 0709  BP: (!) 184/72  Pulse: 65  Resp: 16  Temp: (!) 36.3 C  SpO2: 99%    Last Pain:  Vitals:   05/05/17 0709  TempSrc: Tympanic                 Timko Malta

## 2017-05-05 NOTE — Anesthesia Preprocedure Evaluation (Signed)
Anesthesia Evaluation  Patient identified by MRN, date of birth, ID band Patient awake    Reviewed: Allergy & Precautions, H&P , NPO status , Patient's Chart, lab work & pertinent test results, reviewed documented beta blocker date and time   History of Anesthesia Complications (+) PONV and history of anesthetic complications  Airway Mallampati: II  TM Distance: >3 FB Neck ROM: full    Dental  (+) Partial Lower, Dental Advidsory Given   Pulmonary neg pulmonary ROS,           Cardiovascular Exercise Tolerance: Good negative cardio ROS       Neuro/Psych negative neurological ROS  negative psych ROS   GI/Hepatic negative GI ROS, Neg liver ROS,   Endo/Other  negative endocrine ROS  Renal/GU negative Renal ROS  negative genitourinary   Musculoskeletal   Abdominal   Peds  Hematology negative hematology ROS (+)   Anesthesia Other Findings Past Medical History: No date: Edema     Comment:  FEET/ LEGS No date: Medical history non-contributory 04/30/2017: Post-operative nausea and vomiting No date: Shingles   Reproductive/Obstetrics negative OB ROS                             Anesthesia Physical Anesthesia Plan  ASA: II  Anesthesia Plan: MAC   Post-op Pain Management:    Induction:   PONV Risk Score and Plan:   Airway Management Planned: Nasal Cannula  Additional Equipment:   Intra-op Plan:   Post-operative Plan:   Informed Consent: I have reviewed the patients History and Physical, chart, labs and discussed the procedure including the risks, benefits and alternatives for the proposed anesthesia with the patient or authorized representative who has indicated his/her understanding and acceptance.   Dental Advisory Given  Plan Discussed with: Anesthesiologist, CRNA and Surgeon  Anesthesia Plan Comments:         Anesthesia Quick Evaluation

## 2017-05-05 NOTE — H&P (Signed)
All labs reviewed. Abnormal studies sent to patients PCP when indicated.  Previous H&P reviewed, patient examined, there are NO CHANGES.  Kristine Tiley Porfilio2/26/20198:21 AM

## 2017-05-05 NOTE — Transfer of Care (Signed)
Immediate Anesthesia Transfer of Care Note  Patient: Vanessa Casey  Procedure(s) Performed: CATARACT EXTRACTION PHACO AND INTRAOCULAR LENS PLACEMENT ([facility]) (Right Eye)  Patient Location: PACU  Anesthesia Type:MAC  Level of Consciousness: awake  Airway & Oxygen Therapy: Patient Spontanous Breathing and Patient connected to nasal cannula oxygen  Post-op Assessment: Report given to RN and Post -op Vital signs reviewed and stable  Post vital signs: Reviewed and stable  Last Vitals:  Vitals:   05/05/17 0709  BP: (!) 184/72  Pulse: 65  Resp: 16  Temp: (!) 36.3 C  SpO2: 99%    Last Pain:  Vitals:   05/05/17 0709  TempSrc: Tympanic         Complications: No apparent anesthesia complications

## 2017-05-05 NOTE — Anesthesia Post-op Follow-up Note (Signed)
Anesthesia QCDR form completed.        

## 2017-05-05 NOTE — Discharge Instructions (Signed)
Eye Surgery Discharge Instructions  Expect mild scratchy sensation or mild soreness. DO NOT RUB YOUR EYE!  The day of surgery:  Minimal physical activity, but bed rest is not required  No reading, computer work, or close hand work  No bending, lifting, or straining.  May watch TV  For 24 hours:  No driving, legal decisions, or alcoholic beverages  Safety precautions  Eat anything you prefer: It is better to start with liquids, then soup then solid foods.  _____ Eye patch should be worn until postoperative exam tomorrow.  ____ Solar shield eyeglasses should be worn for comfort in the sunlight/patch while sleeping  Resume all regular medications including aspirin or Coumadin if these were discontinued prior to surgery. You may shower, bathe, shave, or wash your hair. Tylenol may be taken for mild discomfort.  Call your doctor if you experience significant pain, nausea, or vomiting, fever > 101 or other signs of infection. [phone] or [phone] Specific instructions:  Follow-up Information    Birder Robson, MD Follow up on 05/06/2017.   Specialty:  Ophthalmology Why:  10:00 Contact information: [address] [place] [address] [phone]

## 2017-05-05 NOTE — Op Note (Signed)
PREOPERATIVE DIAGNOSIS:  Nuclear sclerotic cataract of the right eye.   POSTOPERATIVE DIAGNOSIS:  NUCLEAR SCLEROTIC CATARACT RIGHT EYE   OPERATIVE PROCEDURE: Procedure(s): CATARACT EXTRACTION PHACO AND INTRAOCULAR LENS PLACEMENT ([facility])   SURGEON:  Birder Robson, MD.   ANESTHESIA:  Anesthesiologist: Martha Clan, MD CRNA: Philbert Riser, CRNA  1.      Managed anesthesia care. 2.      0.41ml of Shugarcaine was instilled in the eye following the paracentesis.   COMPLICATIONS:  None.   TECHNIQUE:   Stop and chop   DESCRIPTION OF PROCEDURE:  The patient was examined and consented in the preoperative holding area where the aforementioned topical anesthesia was applied to the right eye and then brought back to the Operating Room where the right eye was prepped and draped in the usual sterile ophthalmic fashion and a lid speculum was placed. A paracentesis was created with the side port blade and the anterior chamber was filled with viscoelastic. A near clear corneal incision was performed with the steel keratome. A continuous curvilinear capsulorrhexis was performed with a cystotome followed by the capsulorrhexis forceps. Hydrodissection and hydrodelineation were carried out with BSS on a blunt cannula. The lens was removed in a stop and chop  technique and the remaining cortical material was removed with the irrigation-aspiration handpiece. The capsular bag was inflated with viscoelastic and the Technis [id number]  lens was placed in the capsular bag without complication. The remaining viscoelastic was removed from the eye with the irrigation-aspiration handpiece. The wounds were hydrated. The anterior chamber was flushed with Miostat and the eye was inflated to physiologic pressure. 0.51ml of Vigamox was placed in the anterior chamber. The wounds were found to be water tight. The eye was dressed with Vigamox. The patient was given protective glasses to wear throughout the day and a shield with which to  sleep tonight. The patient was also given drops with which to begin a drop regimen today and will follow-up with me in one day. Implant Name Type Inv. Item Serial No. Manufacturer Lot No. LRB No. Used  LENS IOL DIOP 19.0 - [id number] Intraocular Lens LENS IOL DIOP 19.0 [id number] AMO  Right 1   Procedure(s) with comments: CATARACT EXTRACTION PHACO AND INTRAOCULAR LENS PLACEMENT ([facility]) (Right) - [place] 00:33.2 AP% 15.8 CDE 5.25 Fluid Pack Lot # [id number]  Electronically signed: Birder Robson 05/05/2017 8:48 AM

## 2017-05-06 ENCOUNTER — Encounter: Payer: Self-pay | Admitting: Ophthalmology

## 2017-06-04 DIAGNOSIS — H2512 Age-related nuclear cataract, left eye: Secondary | ICD-10-CM | POA: Diagnosis not present

## 2017-06-08 ENCOUNTER — Encounter: Payer: Self-pay | Admitting: *Deleted

## 2017-06-09 ENCOUNTER — Other Ambulatory Visit: Payer: Self-pay

## 2017-06-09 ENCOUNTER — Ambulatory Visit: Payer: PPO | Admitting: Anesthesiology

## 2017-06-09 ENCOUNTER — Ambulatory Visit
Admission: RE | Admit: 2017-06-09 | Discharge: 2017-06-09 | Disposition: A | Discharge status: Home / Self Care | Payer: PPO | Source: Ambulatory Visit | Attending: Ophthalmology | Admitting: Ophthalmology

## 2017-06-09 ENCOUNTER — Encounter
Admission: RE | Disposition: A | Discharge status: Home / Self Care | Payer: Self-pay | Source: Ambulatory Visit | Attending: Ophthalmology

## 2017-06-09 ENCOUNTER — Encounter: Payer: Self-pay | Admitting: *Deleted

## 2017-06-09 DIAGNOSIS — H2512 Age-related nuclear cataract, left eye: Secondary | ICD-10-CM | POA: Diagnosis not present

## 2017-06-09 DIAGNOSIS — Z90710 Acquired absence of both cervix and uterus: Secondary | ICD-10-CM | POA: Diagnosis not present

## 2017-06-09 DIAGNOSIS — Z9849 Cataract extraction status, unspecified eye: Secondary | ICD-10-CM | POA: Diagnosis not present

## 2017-06-09 HISTORY — PX: CATARACT EXTRACTION W/PHACO: SHX586

## 2017-06-09 SURGERY — PHACOEMULSIFICATION, CATARACT, WITH IOL INSERTION
Anesthesia: Monitor Anesthesia Care | Site: Eye | Laterality: Left | Wound class: "Clean "

## 2017-06-09 MED ORDER — MIDAZOLAM HCL 2 MG/2ML IJ SOLN
INTRAMUSCULAR | Status: AC
  Filled 2017-06-09: qty 2

## 2017-06-09 MED ORDER — POVIDONE-IODINE 5 % OP SOLN
OPHTHALMIC | Status: DC | PRN
  Administered 2017-06-09: 1 via OPHTHALMIC

## 2017-06-09 MED ORDER — CARBACHOL 0.01 % IO SOLN
INTRAOCULAR | Status: DC | PRN
  Administered 2017-06-09: 0.5 mL via INTRAOCULAR

## 2017-06-09 MED ORDER — LIDOCAINE HCL (PF) 4 % IJ SOLN
INTRAMUSCULAR | Status: AC
  Filled 2017-06-09: qty 5

## 2017-06-09 MED ORDER — MOXIFLOXACIN HCL 0.5 % OP SOLN
OPHTHALMIC | Status: DC | PRN
  Administered 2017-06-09: 0.2 mL via OPHTHALMIC

## 2017-06-09 MED ORDER — BSS IO SOLN
INTRAOCULAR | Status: DC | PRN
  Administered 2017-06-09: 4 mL via OPHTHALMIC

## 2017-06-09 MED ORDER — POVIDONE-IODINE 5 % OP SOLN
OPHTHALMIC | Status: AC
  Filled 2017-06-09: qty 30

## 2017-06-09 MED ORDER — ARMC OPHTHALMIC DILATING DROPS
OPHTHALMIC | Status: AC
  Administered 2017-06-09: 1 via OPHTHALMIC
  Filled 2017-06-09: qty 0.4

## 2017-06-09 MED ORDER — NA CHONDROIT SULF-NA HYALURON 40-17 MG/ML IO SOLN
INTRAOCULAR | Status: AC
  Filled 2017-06-09: qty 1

## 2017-06-09 MED ORDER — EPINEPHRINE PF 1 MG/ML IJ SOLN
INTRAOCULAR | Status: DC | PRN
  Administered 2017-06-09: 12:00:00 via OPHTHALMIC

## 2017-06-09 MED ORDER — MOXIFLOXACIN HCL 0.5 % OP SOLN
OPHTHALMIC | Status: AC
  Filled 2017-06-09: qty 3

## 2017-06-09 MED ORDER — MOXIFLOXACIN HCL 0.5 % OP SOLN: 1.0000 [drp] | OPHTHALMIC | Status: DC | PRN

## 2017-06-09 MED ORDER — ARMC OPHTHALMIC DILATING DROPS
1.0000 "application " | OPHTHALMIC | Status: AC
  Administered 2017-06-09 (×3): 1 via OPHTHALMIC

## 2017-06-09 MED ORDER — MIDAZOLAM HCL 2 MG/2ML IJ SOLN
INTRAMUSCULAR | Status: DC | PRN
  Administered 2017-06-09 (×2): 0.5 mg via INTRAVENOUS
  Administered 2017-06-09: 1 mg via INTRAVENOUS

## 2017-06-09 MED ORDER — NA CHONDROIT SULF-NA HYALURON 40-17 MG/ML IO SOLN
INTRAOCULAR | Status: DC | PRN
  Administered 2017-06-09: 1 mL via INTRAOCULAR

## 2017-06-09 MED ORDER — EPINEPHRINE PF 1 MG/ML IJ SOLN
INTRAMUSCULAR | Status: AC
  Filled 2017-06-09: qty 2

## 2017-06-09 MED ORDER — SODIUM CHLORIDE 0.9 % IV SOLN
INTRAVENOUS | Status: DC
  Administered 2017-06-09: 11:00:00 via INTRAVENOUS

## 2017-06-09 SURGICAL SUPPLY — 16 items
GLOVE BIO SURGEON STRL SZ8 (GLOVE) ×2 IMPLANT
GLOVE BIOGEL M 6.5 STRL (GLOVE) ×2 IMPLANT
GLOVE SURG LX 8.0 MICRO (GLOVE) ×1
GLOVE SURG LX STRL 8.0 MICRO (GLOVE) ×1 IMPLANT
GOWN STRL REUS W/ TWL LRG LVL3 (GOWN DISPOSABLE) ×2 IMPLANT
GOWN STRL REUS W/TWL LRG LVL3 (GOWN DISPOSABLE) ×2
LABEL CATARACT MEDS ST (LABEL) ×2 IMPLANT
LENS IOL TECNIS ITEC 19.0 (Intraocular Lens) ×1 IMPLANT
PACK CATARACT (MISCELLANEOUS) ×2 IMPLANT
PACK CATARACT BRASINGTON LX (MISCELLANEOUS) ×2 IMPLANT
PACK EYE AFTER SURG (MISCELLANEOUS) ×2 IMPLANT
SOL BSS BAG (MISCELLANEOUS) ×2
SOLUTION BSS BAG (MISCELLANEOUS) ×1 IMPLANT
SYR 5ML LL (SYRINGE) ×2 IMPLANT
WATER STERILE IRR 250ML POUR (IV SOLUTION) ×2 IMPLANT
WIPE NON LINTING 3.25X3.25 (MISCELLANEOUS) ×2 IMPLANT

## 2017-06-09 NOTE — Discharge Instructions (Signed)
Eye Surgery Discharge Instructions  Expect mild scratchy sensation or mild soreness. DO NOT RUB YOUR EYE!  The day of surgery:  Minimal physical activity, but bed rest is not required  No reading, computer work, or close hand work  No bending, lifting, or straining.  May watch TV  For 24 hours:  No driving, legal decisions, or alcoholic beverages  Safety precautions  Eat anything you prefer: It is better to start with liquids, then soup then solid foods.  _____ Eye patch should be worn until postoperative exam tomorrow.  ____ Solar shield eyeglasses should be worn for comfort in the sunlight/patch while sleeping  Resume all regular medications including aspirin or Coumadin if these were discontinued prior to surgery. You may shower, bathe, shave, or wash your hair. Tylenol may be taken for mild discomfort.  Call your doctor if you experience significant pain, nausea, or vomiting, fever > 101 or other signs of infection. [phone] or [phone] Specific instructions:  Follow-up Information    Birder Robson, MD Follow up in 1 day(s).   Specialty:  Ophthalmology Why:  06/10/2017 @ 10:05 Contact information: [address] [place] [id number] [phone]

## 2017-06-09 NOTE — H&P (Signed)
All labs reviewed. Abnormal studies sent to patients PCP when indicated.  Previous H&P reviewed, patient examined, there are NO CHANGES.  Vanessa Provencher Porfilio4/2/201911:37 AM

## 2017-06-09 NOTE — Anesthesia Postprocedure Evaluation (Signed)
Anesthesia Post Note  Patient: Vanessa Casey  Procedure(s) Performed: CATARACT EXTRACTION PHACO AND INTRAOCULAR LENS PLACEMENT ([place]) (Left Eye)  Patient location during evaluation: Endoscopy Anesthesia Type: MAC Level of consciousness: awake and alert Pain management: pain level controlled Vital Signs Assessment: post-procedure vital signs reviewed and stable Respiratory status: spontaneous breathing, nonlabored ventilation and respiratory function stable Cardiovascular status: blood pressure returned to baseline and stable Postop Assessment: no apparent nausea or vomiting Anesthetic complications: no     Last Vitals:  Vitals:   06/09/17 1200 06/09/17 1210  BP: (!) 133/53 136/62  Pulse: 67 65  Resp: 16 16  Temp: 36.8 C   SpO2: 99% 100%    Last Pain:  Vitals:   06/09/17 1210  TempSrc:   PainSc: 0-No pain                 Alphonsus Sias

## 2017-06-09 NOTE — Transfer of Care (Signed)
Immediate Anesthesia Transfer of Care Note  Patient: Vanessa Casey  Procedure(s) Performed: CATARACT EXTRACTION PHACO AND INTRAOCULAR LENS PLACEMENT ([facility]) (Left Eye)  Patient Location: Short Stay  Anesthesia Type:MAC  Level of Consciousness: awake, alert , oriented and patient cooperative  Airway & Oxygen Therapy: Patient Spontanous Breathing  Post-op Assessment: Report given to RN and Post -op Vital signs reviewed and stable  Post vital signs: Reviewed and stable  Last Vitals:  Vitals Value Taken Time  BP    Temp    Pulse    Resp    SpO2      Last Pain:  Vitals:   06/09/17 1031  TempSrc: Rectal  PainSc: 0-No pain         Complications: No apparent anesthesia complications

## 2017-06-09 NOTE — Anesthesia Preprocedure Evaluation (Addendum)
Anesthesia Evaluation  Patient identified by MRN, date of birth, ID band Patient awake    Reviewed: Allergy & Precautions, H&P , NPO status , reviewed documented beta blocker date and time   History of Anesthesia Complications (+) PONV and history of anesthetic complications  Airway Mallampati: II  TM Distance: >3 FB     Dental  (+) Partial Lower, Chipped   Pulmonary    Pulmonary exam normal        Cardiovascular Normal cardiovascular exam     Neuro/Psych    GI/Hepatic   Endo/Other    Renal/GU      Musculoskeletal   Abdominal   Peds  Hematology   Anesthesia Other Findings Past Medical History: No date: Edema     Comment:  FEET/ LEGS No date: Medical history non-contributory 04/30/2017: Post-operative nausea and vomiting No date: Shingles  Reproductive/Obstetrics                            Anesthesia Physical Anesthesia Plan  ASA: II  Anesthesia Plan: General   Post-op Pain Management:    Induction:   PONV Risk Score and Plan: Propofol infusion  Airway Management Planned:   Additional Equipment:   Intra-op Plan:   Post-operative Plan:   Informed Consent: I have reviewed the patients History and Physical, chart, labs and discussed the procedure including the risks, benefits and alternatives for the proposed anesthesia with the patient or authorized representative who has indicated his/her understanding and acceptance.   Dental Advisory Given  Plan Discussed with:   Anesthesia Plan Comments:         Anesthesia Quick Evaluation

## 2017-06-09 NOTE — Op Note (Signed)
PREOPERATIVE DIAGNOSIS:  Nuclear sclerotic cataract of the left eye.   POSTOPERATIVE DIAGNOSIS:  Nuclear sclerotic cataract of the left eye.   OPERATIVE PROCEDURE: Procedure(s): CATARACT EXTRACTION PHACO AND INTRAOCULAR LENS PLACEMENT ([facility])   SURGEON:  Birder Robson, MD.   ANESTHESIA:  Anesthesiologist: Alphonsus Sias, MD CRNA: Eben Burow, CRNA  1.      Managed anesthesia care. 2.     0.26ml of Shugarcaine was instilled following the paracentesis   COMPLICATIONS:  None.   TECHNIQUE:   Stop and chop   DESCRIPTION OF PROCEDURE:  The patient was examined and consented in the preoperative holding area where the aforementioned topical anesthesia was applied to the left eye and then brought back to the Operating Room where the left eye was prepped and draped in the usual sterile ophthalmic fashion and a lid speculum was placed. A paracentesis was created with the side port blade and the anterior chamber was filled with viscoelastic. A near clear corneal incision was performed with the steel keratome. A continuous curvilinear capsulorrhexis was performed with a cystotome followed by the capsulorrhexis forceps. Hydrodissection and hydrodelineation were carried out with BSS on a blunt cannula. The lens was removed in a stop and chop  technique and the remaining cortical material was removed with the irrigation-aspiration handpiece. The capsular bag was inflated with viscoelastic and the Technis [id number] lens was placed in the capsular bag without complication. The remaining viscoelastic was removed from the eye with the irrigation-aspiration handpiece. The wounds were hydrated. The anterior chamber was flushed with Miostat and the eye was inflated to physiologic pressure. 0.83ml Vigamox was placed in the anterior chamber. The wounds were found to be water tight. The eye was dressed with Vigamox. The patient was given protective glasses to wear throughout the day and a shield with which to sleep  tonight. The patient was also given drops with which to begin a drop regimen today and will follow-up with me in one day. Implant Name Type Inv. Item Serial No. Manufacturer Lot No. LRB No. Used  LENS IOL DIOP 19.0 - [id number] Intraocular Lens LENS IOL DIOP 19.0 [id number]  Left 1    Procedure(s) with comments: CATARACT EXTRACTION PHACO AND INTRAOCULAR LENS PLACEMENT ([facility]) (Left) - [place] 01:02.2 AP% 15.2 CDE 9.48 Fluid Pack Lot # [id number]  Electronically signed: Birder Robson 06/09/2017 11:58 AM

## 2017-06-09 NOTE — Anesthesia Post-op Follow-up Note (Signed)
Anesthesia QCDR form completed.        

## 2017-06-15 ENCOUNTER — Ambulatory Visit (INDEPENDENT_AMBULATORY_CARE_PROVIDER_SITE_OTHER): Payer: PPO | Admitting: Family Medicine

## 2017-06-15 ENCOUNTER — Encounter: Payer: Self-pay | Admitting: Family Medicine

## 2017-06-15 VITALS — BP 136/58 | HR 61 | Temp 98.6°F | Wt 165.2 lb

## 2017-06-15 DIAGNOSIS — D485 Neoplasm of uncertain behavior of skin: Secondary | ICD-10-CM

## 2017-06-15 NOTE — Progress Notes (Signed)
BP (!) 136/58 (BP Location: Left Arm, Cuff Size: Normal)   Pulse 61   Temp 98.6 F (37 C)   Wt 165 lb 3 oz (74.9 kg)   SpO2 97%   BMI 32.26 kg/m    Subjective:    Patient ID: Vanessa Casey, female    DOB: 1950-11-06, 67 y.o.   MRN: [id number]  HPI: Vanessa Casey is a 67 y.o. female  Chief Complaint  Patient presents with  . skin spot   SKIN LESION Duration: couple of months Location: R side of face Painful: no Itching: no Onset: sudden Context: bigger Associated signs and symptoms: none History of skin cancer: yes History of precancerous skin lesions: yes Family history of skin cancer: yes  Relevant past medical, surgical, family and social history reviewed and updated as indicated. Interim medical history since our last visit reviewed. Allergies and medications reviewed and updated.  Review of Systems  Constitutional: Negative.   Respiratory: Negative.   Cardiovascular: Negative.   Skin: Negative for color change, pallor, rash and wound.       Lump on her face   Psychiatric/Behavioral: Negative.     Per HPI unless specifically indicated above     Objective:    BP (!) 136/58 (BP Location: Left Arm, Cuff Size: Normal)   Pulse 61   Temp 98.6 F (37 C)   Wt 165 lb 3 oz (74.9 kg)   SpO2 97%   BMI 32.26 kg/m   Wt Readings from Last 3 Encounters:  06/15/17 165 lb 3 oz (74.9 kg)  06/09/17 160 lb (72.6 kg)  05/05/17 164 lb (74.4 kg)    Physical Exam  Constitutional: She is oriented to person, place, and time. She appears well-developed and well-nourished. No distress.  HENT:  Head: Normocephalic and atraumatic.  Right Ear: Hearing normal.  Left Ear: Hearing normal.  Nose: Nose normal.  Eyes: Conjunctivae and lids are normal. Right eye exhibits no discharge. Left eye exhibits no discharge. No scleral icterus.  Cardiovascular: Normal rate, regular rhythm, normal heart sounds and intact distal pulses. Exam reveals no gallop and no friction rub.  No murmur  heard. Pulmonary/Chest: Breath sounds normal. No respiratory distress. She has no wheezes. She has no rales. She exhibits no tenderness.  Musculoskeletal: Normal range of motion.  Neurological: She is alert and oriented to person, place, and time.  Skin: Skin is warm, dry and intact. No rash noted. She is not diaphoretic. No erythema. No pallor.  1 cm raised, erythematous, lesion on R side of face near jaw angle  Psychiatric: She has a normal mood and affect. Her speech is normal and behavior is normal. Judgment and thought content normal. Cognition and memory are normal.  Nursing note and vitals reviewed.   Results for orders placed or performed in visit on 04/30/17  CoaguChek XS/INR Waived  Result Value Ref Range   INR 1.0 0.9 - 1.1   Prothrombin Time 12.0 sec  CBC with Differential/Platelet  Result Value Ref Range   WBC 5.9 3.4 - 10.8 x10E3/uL   RBC 4.51 3.77 - 5.28 x10E6/uL   Hemoglobin 12.1 11.1 - 15.9 g/dL   Hematocrit 36.5 34.0 - 46.6 %   MCV 81 79 - 97 fL   MCH 26.8 26.6 - 33.0 pg   MCHC 33.2 31.5 - 35.7 g/dL   RDW 14.1 12.3 - 15.4 %   Platelets 248 150 - 379 x10E3/uL   Neutrophils 67 Not Estab. %   Lymphs 22 Not Estab. %  Monocytes 10 Not Estab. %   Eos 1 Not Estab. %   Basos 0 Not Estab. %   Neutrophils Absolute 4.0 1.4 - 7.0 x10E3/uL   Lymphocytes Absolute 1.3 0.7 - 3.1 x10E3/uL   Monocytes Absolute 0.6 0.1 - 0.9 x10E3/uL   EOS (ABSOLUTE) 0.1 0.0 - 0.4 x10E3/uL   Basophils Absolute 0.0 0.0 - 0.2 x10E3/uL   Immature Granulocytes 0 Not Estab. %   Immature Grans (Abs) 0.0 0.0 - 0.1 x10E3/uL  Comprehensive metabolic panel  Result Value Ref Range   Glucose 77 65 - 99 mg/dL   BUN 16 8 - 27 mg/dL   Creatinine, Ser 0.74 0.57 - 1.00 mg/dL   GFR calc non Af Amer 85 >59 mL/min/1.73   GFR calc Af Amer 98 >59 mL/min/1.73   BUN/Creatinine Ratio 22 12 - 28   Sodium 141 134 - 144 mmol/L   Potassium 3.8 3.5 - 5.2 mmol/L   Chloride 102 96 - 106 mmol/L   CO2 22 20 - 29  mmol/L   Calcium 9.2 8.7 - 10.3 mg/dL   Total Protein 7.5 6.0 - 8.5 g/dL   Albumin 4.4 3.6 - 4.8 g/dL   Globulin, Total 3.1 1.5 - 4.5 g/dL   Albumin/Globulin Ratio 1.4 1.2 - 2.2   Bilirubin Total 0.3 0.0 - 1.2 mg/dL   Alkaline Phosphatase 100 39 - 117 IU/L   AST 18 0 - 40 IU/L   ALT 15 0 - 32 IU/L      Assessment & Plan:   Problem List Items Addressed This Visit    None    Visit Diagnoses    Neoplasm of uncertain behavior of skin    -  Primary   Will get her into dermatology for evaluation given location on face. Call with any concerns.    Relevant Orders   Ambulatory referral to Dermatology       Follow up plan: Return if symptoms worsen or fail to improve.

## 2017-07-28 ENCOUNTER — Ambulatory Visit: Payer: PPO | Admitting: Family Medicine

## 2017-07-30 DIAGNOSIS — D485 Neoplasm of uncertain behavior of skin: Secondary | ICD-10-CM | POA: Diagnosis not present

## 2017-07-30 DIAGNOSIS — C44319 Basal cell carcinoma of skin of other parts of face: Secondary | ICD-10-CM | POA: Diagnosis not present

## 2017-07-30 DIAGNOSIS — L821 Other seborrheic keratosis: Secondary | ICD-10-CM | POA: Diagnosis not present

## 2017-07-30 DIAGNOSIS — L570 Actinic keratosis: Secondary | ICD-10-CM | POA: Diagnosis not present

## 2017-07-30 DIAGNOSIS — D225 Melanocytic nevi of trunk: Secondary | ICD-10-CM | POA: Diagnosis not present

## 2017-07-30 DIAGNOSIS — D229 Melanocytic nevi, unspecified: Secondary | ICD-10-CM | POA: Diagnosis not present

## 2017-07-30 DIAGNOSIS — C44619 Basal cell carcinoma of skin of left upper limb, including shoulder: Secondary | ICD-10-CM | POA: Diagnosis not present

## 2017-07-30 DIAGNOSIS — I8393 Asymptomatic varicose veins of bilateral lower extremities: Secondary | ICD-10-CM | POA: Diagnosis not present

## 2017-07-30 DIAGNOSIS — L814 Other melanin hyperpigmentation: Secondary | ICD-10-CM | POA: Diagnosis not present

## 2017-07-30 DIAGNOSIS — C4491 Basal cell carcinoma of skin, unspecified: Secondary | ICD-10-CM

## 2017-07-30 HISTORY — DX: Basal cell carcinoma of skin, unspecified: C44.91

## 2017-07-30 HISTORY — DX: Melanocytic nevi, unspecified: D22.9

## 2017-08-06 DIAGNOSIS — Z4802 Encounter for removal of sutures: Secondary | ICD-10-CM | POA: Diagnosis not present

## 2017-09-16 DIAGNOSIS — C44619 Basal cell carcinoma of skin of left upper limb, including shoulder: Secondary | ICD-10-CM | POA: Diagnosis not present

## 2017-09-30 DIAGNOSIS — Z85828 Personal history of other malignant neoplasm of skin: Secondary | ICD-10-CM | POA: Diagnosis not present

## 2017-09-30 DIAGNOSIS — L820 Inflamed seborrheic keratosis: Secondary | ICD-10-CM | POA: Diagnosis not present

## 2017-09-30 DIAGNOSIS — D485 Neoplasm of uncertain behavior of skin: Secondary | ICD-10-CM | POA: Diagnosis not present

## 2017-09-30 DIAGNOSIS — L570 Actinic keratosis: Secondary | ICD-10-CM | POA: Diagnosis not present

## 2017-10-19 DIAGNOSIS — C44319 Basal cell carcinoma of skin of other parts of face: Secondary | ICD-10-CM | POA: Diagnosis not present

## 2017-10-19 DIAGNOSIS — L578 Other skin changes due to chronic exposure to nonionizing radiation: Secondary | ICD-10-CM | POA: Diagnosis not present

## 2017-10-19 DIAGNOSIS — L988 Other specified disorders of the skin and subcutaneous tissue: Secondary | ICD-10-CM | POA: Diagnosis not present

## 2017-10-19 DIAGNOSIS — L814 Other melanin hyperpigmentation: Secondary | ICD-10-CM | POA: Diagnosis not present

## 2017-12-16 ENCOUNTER — Ambulatory Visit (INDEPENDENT_AMBULATORY_CARE_PROVIDER_SITE_OTHER): Payer: PPO

## 2017-12-16 DIAGNOSIS — Z23 Encounter for immunization: Secondary | ICD-10-CM

## 2018-01-14 ENCOUNTER — Ambulatory Visit (INDEPENDENT_AMBULATORY_CARE_PROVIDER_SITE_OTHER): Payer: PPO | Admitting: Family Medicine

## 2018-01-14 ENCOUNTER — Encounter: Payer: Self-pay | Admitting: Family Medicine

## 2018-01-14 ENCOUNTER — Ambulatory Visit (INDEPENDENT_AMBULATORY_CARE_PROVIDER_SITE_OTHER): Payer: PPO

## 2018-01-14 VITALS — BP 136/71 | HR 71 | Temp 98.3°F | Ht 60.0 in | Wt 169.0 lb

## 2018-01-14 VITALS — BP 136/71 | HR 65 | Temp 98.3°F | Ht 61.25 in | Wt 169.9 lb

## 2018-01-14 DIAGNOSIS — Z1239 Encounter for other screening for malignant neoplasm of breast: Secondary | ICD-10-CM

## 2018-01-14 DIAGNOSIS — Z0000 Encounter for general adult medical examination without abnormal findings: Secondary | ICD-10-CM

## 2018-01-14 DIAGNOSIS — Z23 Encounter for immunization: Secondary | ICD-10-CM | POA: Diagnosis not present

## 2018-01-14 DIAGNOSIS — Z13820 Encounter for screening for osteoporosis: Secondary | ICD-10-CM | POA: Diagnosis not present

## 2018-01-14 DIAGNOSIS — Z1211 Encounter for screening for malignant neoplasm of colon: Secondary | ICD-10-CM

## 2018-01-14 LAB — UA/M W/RFLX CULTURE, ROUTINE
Bilirubin, UA: NEGATIVE
Glucose, UA: NEGATIVE
Ketones, UA: NEGATIVE
Leukocytes, UA: NEGATIVE
NITRITE UA: NEGATIVE
Protein, UA: NEGATIVE
Specific Gravity, UA: <1.005 — ABNORMAL LOW (ref 1.005–1.030)
UUROB: 0.2 mg/dL (ref 0.2–1.0)
pH, UA: 5.5 (ref 5.0–7.5)

## 2018-01-14 LAB — MICROSCOPIC EXAMINATION
BACTERIA UA: NONE SEEN
WBC UA: NONE SEEN /HPF (ref 0–5)

## 2018-01-14 NOTE — Progress Notes (Signed)
Subjective:   Lashai Grosch is a 67 y.o. female who presents for Medicare Annual (Subsequent) preventive examination.  Last AWV-01/12/2017    Objective:     Vitals: BP 136/71 (BP Location: Left Arm, Patient Position: Sitting)   Pulse 71   Temp 98.3 F (36.8 C) (Oral)   Ht 5' (1.524 m)   Wt 169 lb (76.7 kg)   BMI 33.01 kg/m   Body mass index is 33.01 kg/m.  Advanced Directives 01/14/2018 06/09/2017 05/05/2017 11/08/2014  Does Patient Have a Medical Advance Directive? No No No No  Would patient like information on creating a medical advance directive? Yes (MAU/Ambulatory/Procedural Areas - Information given) No - Patient declined - Yes - Educational materials given    Tobacco Social History   Tobacco Use  Smoking Status Never Smoker  Smokeless Tobacco Never Used     Counseling given: Not Answered   Clinical Intake:  Pre-visit preparation completed: No  Pain : No/denies pain     Diabetes: No  How often do you need to have someone help you when you read instructions, pamphlets, or other written materials from your doctor or pharmacy?: 1 - Never What is the last grade level you completed in school?: GED  Interpreter Needed?: No  Information entered by :: Tyson Dense, RN  Past Medical History:  Diagnosis Date  . Edema    FEET/ LEGS  . Medical history non-contributory   . Post-operative nausea and vomiting 04/30/2017  . Shingles    Past Surgical History:  Procedure Laterality Date  . ABDOMINAL HYSTERECTOMY    . BASAL CELL CARCINOMA EXCISION    . CATARACT EXTRACTION W/PHACO Right 05/05/2017   Procedure: CATARACT EXTRACTION PHACO AND INTRAOCULAR LENS PLACEMENT ([facility]);  Surgeon: Birder Robson, MD;  Location: [facility] ORS;  Service: Ophthalmology;  Laterality: Right;  [place] 00:33.2 AP% 15.8 CDE 5.25 Fluid Pack Lot # [id number]  . CATARACT EXTRACTION W/PHACO Left 06/09/2017   Procedure: CATARACT EXTRACTION PHACO AND INTRAOCULAR LENS PLACEMENT ([place]);  Surgeon: Birder Robson, MD;  Location: [facility] ORS;  Service: Ophthalmology;  Laterality: Left;  [place] 01:02.2 AP% 15.2 CDE 9.48 Fluid Pack Lot # [id number]   Family History  Problem Relation Age of Onset  . Dementia Mother   . Heart failure Father   . Heart attack Sister   . Heart attack Brother   . Breast cancer Maternal Aunt    Social History   Socioeconomic History  . Marital status: Single    Spouse name: Not on file  . Number of children: Not on file  . Years of education: Not on file  . Highest education level: Not on file  Occupational History  . Not on file  Social Needs  . Financial resource strain: Not hard at all  . Food insecurity:    Worry: Never true    Inability: Never true  . Transportation needs:    Medical: No    Non-medical: No  Tobacco Use  . Smoking status: Never Smoker  . Smokeless tobacco: Never Used  Substance and Sexual Activity  . Alcohol use: No  . Drug use: No  . Sexual activity: Never  Lifestyle  . Physical activity:    Days per week: 0 days    Minutes per session: 0 min  . Stress: Not at all  Relationships  . Social connections:    Talks on phone: More than three times a week    Gets together: More than three times a week    Attends  religious service: Never    Active member of club or organization: No    Attends meetings of clubs or organizations: Never    Relationship status: Never married  Other Topics Concern  . Not on file  Social History Narrative  . Not on file    Outpatient Encounter Medications as of 01/14/2018  Medication Sig  . ibuprofen (ADVIL,MOTRIN) 200 MG tablet Take 400 mg by mouth every 6 (six) hours as needed for headache or moderate pain.   No facility-administered encounter medications on file as of 01/14/2018.     Activities of Daily Living In your present state of health, do you have any difficulty performing the following activities: 01/14/2018 01/14/2018  Hearing? N N  Vision? N N  Difficulty concentrating or making  decisions? N N  Walking or climbing stairs? N N  Dressing or bathing? N N  Doing errands, shopping? N N  Preparing Food and eating ? N -  Using the Toilet? N -  In the past six months, have you accidently leaked urine? N -  Do you have problems with loss of bowel control? N -  Managing your Medications? N -  Managing your Finances? N -  Housekeeping or managing your Housekeeping? N -  Some recent data might be hidden    Patient Care Team: Valerie Roys, DO as PCP - General (Family Medicine) Anell Barr, OD (Optometry)    Assessment:   This is a routine wellness examination for Sharai.  Exercise Activities and Dietary recommendations Current Exercise Habits: The patient does not participate in regular exercise at present, Exercise limited by: None identified  Goals   None     Fall Risk Fall Risk  01/14/2018 01/12/2017  Falls in the past year? 0 No  Number falls in past yr: 1 -  Injury with Fall? 0 -   Is the patient's home free of loose throw rugs in walkways, pet beds, electrical cords, etc?   yes      Grab bars in the bathroom? yes      Handrails on the stairs?   yes      Adequate lighting?   yes  Depression Screen PHQ 2/9 Scores 01/14/2018 01/14/2018 01/12/2017  PHQ - 2 Score 0 0 0  PHQ- 9 Score - 0 -     Cognitive Function     6CIT Screen 01/14/2018 01/12/2017  What Year? 0 points 0 points  What month? 0 points 0 points  What time? 0 points 0 points  Count back from 20 0 points 0 points  Months in reverse 0 points 0 points  Repeat phrase 0 points 2 points  Total Score 0 2    Immunization History  Administered Date(s) Administered  . Influenza, High Dose Seasonal PF 12/16/2017  . Influenza,inj,Quad PF,6+ Mos 12/11/2016  . Pneumococcal Conjugate-13 01/12/2017  . Pneumococcal Polysaccharide-23 01/14/2018  . Td 11/08/2014    Qualifies for Shingles Vaccine? Yes, educated and ordered to pharmacy  Screening Tests Health Maintenance  Topic Date Due  .  COLONOSCOPY  12/31/2001  . DEXA SCAN  12/31/2016  . MAMMOGRAM  12/23/2018  . TETANUS/TDAP  11/07/2024  . INFLUENZA VACCINE  Completed  . Hepatitis C Screening  Completed  . PNA vac Low Risk Adult  Completed    Cancer Screenings: Lung: Low Dose CT Chest recommended if Age 94-80 years, 30 pack-year currently smoking OR have quit w/in 15years. Patient does not qualify. Breast:  Up to date on Mammogram? No, ordered  by PCP Up to date of Bone Density/Dexa? No, ordered by PCP Colorectal: due, cologuard ordered by PCP  Additional Screenings:  Hepatitis C Screening: declined Pneumovax FBP:ZWCHE today     Plan:  I have personally reviewed and addressed the Medicare Annual Wellness questionnaire and have noted the following in the patient's chart:  A. Medical and social history B. Use of alcohol, tobacco or illicit drugs  C. Current medications and supplements D. Functional ability and status E.  Nutritional status F.  Physical activity G. Advance directives H. List of other physicians I.  Hospitalizations, surgeries, and ER visits in previous 12 months J.  Laurie to include hearing, vision, cognitive, depression L. Referrals and appointments - none  In addition, I have reviewed and discussed with patient certain preventive protocols, quality metrics, and best practice recommendations. A written personalized care plan for preventive services as well as general preventive health recommendations were provided to patient.  See attached scanned questionnaire for additional information.   Signed,   Tyson Dense, RN Nurse Health Advisor  Patient Concerns: None

## 2018-01-14 NOTE — Patient Instructions (Signed)
Vanessa Casey , Thank you for taking time to come for your Medicare Wellness Visit. I appreciate your ongoing commitment to your health goals. Please review the following plan we discussed and let me know if I can assist you in the future.   Screening recommendations/referrals: Colonoscopy due, cologuard ordered Mammogram due, ordered Bone Density due, ordered Recommended yearly ophthalmology/optometry visit for glaucoma screening and checkup Recommended yearly dental visit for hygiene and checkup  Vaccinations: Influenza vaccine up to date Pneumococcal vaccine 23 given today. Up to date, completed Tdap vaccine up to date, due 11/07/2024 Shingles vaccine due, ordered to pharmacy    Advanced directives: Please bring [place] a copy of your living will and health care power of attorney once it's filled out  Conditions/risks identified: none  Next appointment: Medicare Wellness 01/17/2019 @ 3:15pm   Preventive Care 66 Years and Older, Female Preventive care refers to lifestyle choices and visits with your health care provider that can promote health and wellness. What does preventive care include?  A yearly physical exam. This is also called an annual well check.  Dental exams once or twice a year.  Routine eye exams. Ask your health care provider how often you should have your eyes checked.  Personal lifestyle choices, including:  Daily care of your teeth and gums.  Regular physical activity.  Eating a healthy diet.  Avoiding tobacco and drug use.  Limiting alcohol use.  Practicing safe sex.  Taking low-dose aspirin every day.  Taking vitamin and mineral supplements as recommended by your health care provider. What happens during an annual well check? The services and screenings done by your health care provider during your annual well check will depend on your age, overall health, lifestyle risk factors, and family history of disease. Counseling  Your health care provider  may ask you questions about your:  Alcohol use.  Tobacco use.  Drug use.  Emotional well-being.  Home and relationship well-being.  Sexual activity.  Eating habits.  History of falls.  Memory and ability to understand (cognition).  Work and work Statistician.  Reproductive health. Screening  You may have the following tests or measurements:  Height, weight, and BMI.  Blood pressure.  Lipid and cholesterol levels. These may be checked every 5 years, or more frequently if you are over 49 years old.  Skin check.  Lung cancer screening. You may have this screening every year starting at age 66 if you have a 30-pack-year history of smoking and currently smoke or have quit within the past 15 years.  Fecal occult blood test (FOBT) of the stool. You may have this test every year starting at age 50.  Flexible sigmoidoscopy or colonoscopy. You may have a sigmoidoscopy every 5 years or a colonoscopy every 10 years starting at age 49.  Hepatitis C blood test.  Hepatitis B blood test.  Sexually transmitted disease (STD) testing.  Diabetes screening. This is done by checking your blood sugar (glucose) after you have not eaten for a while (fasting). You may have this done every 1-3 years.  Bone density scan. This is done to screen for osteoporosis. You may have this done starting at age 44.  Mammogram. This may be done every 1-2 years. Talk to your health care provider about how often you should have regular mammograms. Talk with your health care provider about your test results, treatment options, and if necessary, the need for more tests. Vaccines  Your health care provider may recommend certain vaccines, such as:  Influenza  vaccine. This is recommended every year.  Tetanus, diphtheria, and acellular pertussis (Tdap, Td) vaccine. You may need a Td booster every 10 years.  Zoster vaccine. You may need this after age 25.  Pneumococcal 13-valent conjugate (PCV13) vaccine.  One dose is recommended after age 76.  Pneumococcal polysaccharide (PPSV23) vaccine. One dose is recommended after age 43. Talk to your health care provider about which screenings and vaccines you need and how often you need them. This information is not intended to replace advice given to you by your health care provider. Make sure you discuss any questions you have with your health care provider. Document Released: 03/23/2015 Document Revised: 11/14/2015 Document Reviewed: 12/26/2014 Elsevier Interactive Patient Education  2017 [place] Prevention in the Home Falls can cause injuries. They can happen to people of all ages. There are many things you can do to make your home safe and to help prevent falls. What can I do on the outside of my home?  Regularly fix the edges of walkways and driveways and fix any cracks.  Remove anything that might make you trip as you walk through a door, such as a raised step or threshold.  Trim any bushes or trees on the path to your home.  Use bright outdoor lighting.  Clear any walking paths of anything that might make someone trip, such as rocks or tools.  Regularly check to see if handrails are loose or broken. Make sure that both sides of any steps have handrails.  Any raised decks and porches should have guardrails on the edges.  Have any leaves, snow, or ice cleared regularly.  Use sand or salt on walking paths during winter.  Clean up any spills in your garage right away. This includes oil or grease spills. What can I do in the bathroom?  Use night lights.  Install grab bars by the toilet and in the tub and shower. Do not use towel bars as grab bars.  Use non-skid mats or decals in the tub or shower.  If you need to sit down in the shower, use a plastic, non-slip stool.  Keep the floor dry. Clean up any water that spills on the floor as soon as it happens.  Remove soap buildup in the tub or shower regularly.  Attach bath  mats securely with double-sided non-slip rug tape.  Do not have throw rugs and other things on the floor that can make you trip. What can I do in the bedroom?  Use night lights.  Make sure that you have a light by your bed that is easy to reach.  Do not use any sheets or blankets that are too big for your bed. They should not hang down onto the floor.  Have a firm chair that has side arms. You can use this for support while you get dressed.  Do not have throw rugs and other things on the floor that can make you trip. What can I do in the kitchen?  Clean up any spills right away.  Avoid walking on wet floors.  Keep items that you use a lot in easy-to-reach places.  If you need to reach something above you, use a strong step stool that has a grab bar.  Keep electrical cords out of the way.  Do not use floor polish or wax that makes floors slippery. If you must use wax, use non-skid floor wax.  Do not have throw rugs and other things on the floor that can  make you trip. What can I do with my stairs?  Do not leave any items on the stairs.  Make sure that there are handrails on both sides of the stairs and use them. Fix handrails that are broken or loose. Make sure that handrails are as long as the stairways.  Check any carpeting to make sure that it is firmly attached to the stairs. Fix any carpet that is loose or worn.  Avoid having throw rugs at the top or bottom of the stairs. If you do have throw rugs, attach them to the floor with carpet tape.  Make sure that you have a light switch at the top of the stairs and the bottom of the stairs. If you do not have them, ask someone to add them for you. What else can I do to help prevent falls?  Wear shoes that:  Do not have high heels.  Have rubber bottoms.  Are comfortable and fit you well.  Are closed at the toe. Do not wear sandals.  If you use a stepladder:  Make sure that it is fully opened. Do not climb a closed  stepladder.  Make sure that both sides of the stepladder are locked into place.  Ask someone to hold it for you, if possible.  Clearly mark and make sure that you can see:  Any grab bars or handrails.  First and last steps.  Where the edge of each step is.  Use tools that help you move around (mobility aids) if they are needed. These include:  Canes.  Walkers.  Scooters.  Crutches.  Turn on the lights when you go into a dark area. Replace any light bulbs as soon as they burn out.  Set up your furniture so you have a clear path. Avoid moving your furniture around.  If any of your floors are uneven, fix them.  If there are any pets around you, be aware of where they are.  Review your medicines with your doctor. Some medicines can make you feel dizzy. This can increase your chance of falling. Ask your doctor what other things that you can do to help prevent falls. This information is not intended to replace advice given to you by your health care provider. Make sure you discuss any questions you have with your health care provider. Document Released: 12/21/2008 Document Revised: 08/02/2015 Document Reviewed: 03/31/2014 Elsevier Interactive Patient Education  2017 [employer].

## 2018-01-14 NOTE — Progress Notes (Signed)
BP 136/71 (BP Location: Left Arm, Patient Position: Sitting, Cuff Size: Normal)   Pulse 65   Temp 98.3 F (36.8 C) (Oral)   Ht 5' 1.25" (1.556 m)   Wt 169 lb 14.4 oz (77.1 kg)   SpO2 97%   BMI 31.84 kg/m    Subjective:    Patient ID: Vanessa Casey, female    DOB: 1950/08/05, 67 y.o.   MRN: [id number]  HPI: Vanessa Casey is a 67 y.o. female presenting on 01/14/2018 for comprehensive medical examination. Current medical complaints include:none  Menopausal Symptoms: no  Depression Screen done today and results listed below:  Depression screen [facility] 2/9 01/14/2018 01/14/2018 01/12/2017  Decreased Interest 0 0 0  Down, Depressed, Hopeless 0 0 0  PHQ - 2 Score 0 0 0  Altered sleeping - 0 -  Tired, decreased energy - 0 -  Change in appetite - 0 -  Feeling bad or failure about yourself  - 0 -  Trouble concentrating - 0 -  Moving slowly or fidgety/restless - 0 -  Suicidal thoughts - 0 -  PHQ-9 Score - 0 -  Difficult doing work/chores - Not difficult at all -    Past Medical History:  Past Medical History:  Diagnosis Date  . Edema    FEET/ LEGS  . Medical history non-contributory   . Post-operative nausea and vomiting 04/30/2017  . Shingles     Surgical History:  Past Surgical History:  Procedure Laterality Date  . ABDOMINAL HYSTERECTOMY    . BASAL CELL CARCINOMA EXCISION    . CATARACT EXTRACTION W/PHACO Right 05/05/2017   Procedure: CATARACT EXTRACTION PHACO AND INTRAOCULAR LENS PLACEMENT (IOC);  Surgeon: Birder Robson, MD;  Location: [facility] ORS;  Service: Ophthalmology;  Laterality: Right;  [place] 00:33.2 AP% 15.8 CDE 5.25 Fluid Pack Lot # [id number]  . CATARACT EXTRACTION W/PHACO Left 06/09/2017   Procedure: CATARACT EXTRACTION PHACO AND INTRAOCULAR LENS PLACEMENT ([place]);  Surgeon: Birder Robson, MD;  Location: [facility] ORS;  Service: Ophthalmology;  Laterality: Left;  [place] 01:02.2 AP% 15.2 CDE 9.48 Fluid Pack Lot # [id number]    Medications:  Current Outpatient Medications on  File Prior to Visit  Medication Sig  . ibuprofen (ADVIL,MOTRIN) 200 MG tablet Take 400 mg by mouth every 6 (six) hours as needed for headache or moderate pain.   No current facility-administered medications on file prior to visit.     Allergies:  No Known Allergies  Social History:  Social History   Socioeconomic History  . Marital status: Single    Spouse name: Not on file  . Number of children: Not on file  . Years of education: Not on file  . Highest education level: Not on file  Occupational History  . Not on file  Social Needs  . Financial resource strain: Not hard at all  . Food insecurity:    Worry: Never true    Inability: Never true  . Transportation needs:    Medical: No    Non-medical: No  Tobacco Use  . Smoking status: Never Smoker  . Smokeless tobacco: Never Used  Substance and Sexual Activity  . Alcohol use: No  . Drug use: No  . Sexual activity: Never  Lifestyle  . Physical activity:    Days per week: 0 days    Minutes per session: 0 min  . Stress: Not at all  Relationships  . Social connections:    Talks on phone: More than three times a week    Gets together:  More than three times a week    Attends religious service: Never    Active member of club or organization: No    Attends meetings of clubs or organizations: Never    Relationship status: Never married  . Intimate partner violence:    Fear of current or ex partner: No    Emotionally abused: No    Physically abused: No    Forced sexual activity: No  Other Topics Concern  . Not on file  Social History Narrative  . Not on file   Social History   Tobacco Use  Smoking Status Never Smoker  Smokeless Tobacco Never Used   Social History   Substance and Sexual Activity  Alcohol Use No    Family History:  Family History  Problem Relation Age of Onset  . Dementia Mother   . Heart failure Father   . Heart attack Sister   . Heart attack Brother   . Breast cancer Maternal Aunt      Past medical history, surgical history, medications, allergies, family history and social history reviewed with patient today and changes made to appropriate areas of the chart.   Review of Systems  Constitutional: Negative.   HENT: Negative.   Eyes: Negative.   Respiratory: Negative.   Cardiovascular: Positive for leg swelling. Negative for chest pain, palpitations, orthopnea, claudication and PND.  Gastrointestinal: Negative.   Genitourinary: Negative.   Musculoskeletal: Positive for back pain. Negative for falls, joint pain, myalgias and neck pain.  Skin: Negative.   Neurological: Negative.   Endo/Heme/Allergies: Negative.   Psychiatric/Behavioral: Negative.     All other ROS negative except what is listed above and in the HPI.      Objective:    BP 136/71 (BP Location: Left Arm, Patient Position: Sitting, Cuff Size: Normal)   Pulse 65   Temp 98.3 F (36.8 C) (Oral)   Ht 5' 1.25" (1.556 m)   Wt 169 lb 14.4 oz (77.1 kg)   SpO2 97%   BMI 31.84 kg/m   Wt Readings from Last 3 Encounters:  01/14/18 169 lb (76.7 kg)  01/14/18 169 lb 14.4 oz (77.1 kg)  06/15/17 165 lb 3 oz (74.9 kg)    Physical Exam  Constitutional: She is oriented to person, place, and time. She appears well-developed and well-nourished. No distress.  HENT:  Head: Normocephalic and atraumatic.  Right Ear: Hearing, tympanic membrane, external ear and ear canal normal.  Left Ear: Hearing, tympanic membrane, external ear and ear canal normal.  Nose: Nose normal.  Mouth/Throat: Uvula is midline, oropharynx is clear and moist and mucous membranes are normal. No oropharyngeal exudate.  Eyes: Pupils are equal, round, and reactive to light. Conjunctivae, EOM and lids are normal. Right eye exhibits no discharge. Left eye exhibits no discharge. No scleral icterus.  Neck: Normal range of motion. Neck supple. No JVD present. No tracheal deviation present. No thyromegaly present.  Cardiovascular: Normal rate,  regular rhythm, normal heart sounds and intact distal pulses. Exam reveals no gallop and no friction rub.  No murmur heard. Pulmonary/Chest: Effort normal and breath sounds normal. No stridor. No respiratory distress. She has no wheezes. She has no rales. She exhibits no tenderness. Right breast exhibits no inverted nipple, no mass, no nipple discharge, no skin change and no tenderness. Left breast exhibits no inverted nipple, no mass, no nipple discharge, no skin change and no tenderness. No breast swelling, tenderness, discharge or bleeding. Breasts are symmetrical.  Abdominal: Soft. Bowel sounds are normal. She  exhibits no distension and no mass. There is no tenderness. There is no rebound and no guarding. No hernia.  Musculoskeletal: Normal range of motion. She exhibits no edema, tenderness or deformity.  Lymphadenopathy:    She has no cervical adenopathy.  Neurological: She is alert and oriented to person, place, and time. She displays normal reflexes. No cranial nerve deficit or sensory deficit. She exhibits normal muscle tone. Coordination normal.  Skin: Skin is warm, dry and intact. Capillary refill takes less than 2 seconds. No rash noted. She is not diaphoretic. No erythema. No pallor.  Psychiatric: She has a normal mood and affect. Her speech is normal and behavior is normal. Judgment and thought content normal. Cognition and memory are normal.  Nursing note and vitals reviewed.  Breast exam done today with Yvonna Alanis, CMA in attendance.  Results for orders placed or performed in visit on 04/30/17  CoaguChek XS/INR Waived  Result Value Ref Range   INR 1.0 0.9 - 1.1   Prothrombin Time 12.0 sec  CBC with Differential/Platelet  Result Value Ref Range   WBC 5.9 3.4 - 10.8 x10E3/uL   RBC 4.51 3.77 - 5.28 x10E6/uL   Hemoglobin 12.1 11.1 - 15.9 g/dL   Hematocrit 36.5 34.0 - 46.6 %   MCV 81 79 - 97 fL   MCH 26.8 26.6 - 33.0 pg   MCHC 33.2 31.5 - 35.7 g/dL   RDW 14.1 12.3 - 15.4 %    Platelets 248 150 - 379 x10E3/uL   Neutrophils 67 Not Estab. %   Lymphs 22 Not Estab. %   Monocytes 10 Not Estab. %   Eos 1 Not Estab. %   Basos 0 Not Estab. %   Neutrophils Absolute 4.0 1.4 - 7.0 x10E3/uL   Lymphocytes Absolute 1.3 0.7 - 3.1 x10E3/uL   Monocytes Absolute 0.6 0.1 - 0.9 x10E3/uL   EOS (ABSOLUTE) 0.1 0.0 - 0.4 x10E3/uL   Basophils Absolute 0.0 0.0 - 0.2 x10E3/uL   Immature Granulocytes 0 Not Estab. %   Immature Grans (Abs) 0.0 0.0 - 0.1 x10E3/uL  Comprehensive metabolic panel  Result Value Ref Range   Glucose 77 65 - 99 mg/dL   BUN 16 8 - 27 mg/dL   Creatinine, Ser 0.74 0.57 - 1.00 mg/dL   GFR calc non Af Amer 85 >59 mL/min/1.73   GFR calc Af Amer 98 >59 mL/min/1.73   BUN/Creatinine Ratio 22 12 - 28   Sodium 141 134 - 144 mmol/L   Potassium 3.8 3.5 - 5.2 mmol/L   Chloride 102 96 - 106 mmol/L   CO2 22 20 - 29 mmol/L   Calcium 9.2 8.7 - 10.3 mg/dL   Total Protein 7.5 6.0 - 8.5 g/dL   Albumin 4.4 3.6 - 4.8 g/dL   Globulin, Total 3.1 1.5 - 4.5 g/dL   Albumin/Globulin Ratio 1.4 1.2 - 2.2   Bilirubin Total 0.3 0.0 - 1.2 mg/dL   Alkaline Phosphatase 100 39 - 117 IU/L   AST 18 0 - 40 IU/L   ALT 15 0 - 32 IU/L      Assessment & Plan:   Problem List Items Addressed This Visit    None    Visit Diagnoses    Routine general medical examination at a health care facility    -  Primary   Vaccines updated. Screening labs checked today. Pap N/A. [place]amm[place], cologuard and DEXA ordered today. Continue diet and exercise. Call with any concerns.    Relevant Orders   CBC with  Differential/Platelet   Comprehensive metabolic panel   Lipid Panel w/o Chol/HDL Ratio   TSH   UA/M w/rflx Culture, Routine   Screening for breast cancer       Mammogram ordered today.   Relevant Orders   MM DIGITAL SCREENING BILATERAL   Screening for osteoporosis       DEXA ordered today   Relevant Orders   DG Bone Density   Screening for colon cancer       Cologuard ordered today    Relevant Orders   Cologuard       Follow up plan: Return in about 1 year (around 01/15/2019) for Physical/Wellness.   LABORATORY TESTING:  - Pap smear: not applicable  IMMUNIZATIONS:   - Tdap: Tetanus vaccination status reviewed: last tetanus booster within 10 years. - Influenza: Up to date - Pneumovax: Administered today - Prevnar: Up to date  SCREENING: -Mammogram: Ordered today  - Colonoscopy: Ordered today  - Bone Density: Ordered today   PATIENT COUNSELING:   Advised to take 1 mg of folate supplement per day if capable of pregnancy.   Sexuality: Discussed sexually transmitted diseases, partner selection, use of condoms, avoidance of unintended pregnancy  and contraceptive alternatives.   Advised to avoid cigarette smoking.  I discussed with the patient that most people either abstain from alcohol or drink within safe limits (<=14/week and <=4 drinks/occasion for males, <=7/weeks and <= 3 drinks/occasion for females) and that the risk for alcohol disorders and other health effects rises proportionally with the number of drinks per week and how often a drinker exceeds daily limits.  Discussed cessation/primary prevention of drug use and availability of treatment for abuse.   Diet: Encouraged to adjust caloric intake to maintain  or achieve ideal body weight, to reduce intake of dietary saturated fat and total fat, to limit sodium intake by avoiding high sodium foods and not adding table salt, and to maintain adequate dietary potassium and calcium preferably from fresh fruits, vegetables, and low-fat dairy products.    stressed the importance of regular exercise  Injury prevention: Discussed safety belts, safety helmets, smoke detector, smoking near bedding or upholstery.   Dental health: Discussed importance of regular tooth brushing, flossing, and dental visits.    NEXT PREVENTATIVE PHYSICAL DUE IN 1 YEAR. Return in about 1 year (around 01/15/2019) for  Physical/Wellness.

## 2018-01-14 NOTE — Patient Instructions (Addendum)
[facility] at [facility]- Call to schedule your mammogram and bone denisty  Address: [address], [place], [place] [address]  Phone: [phone]   Health Maintenance for Postmenopausal Women Menopause is a normal process in which your reproductive ability comes to an end. This process happens gradually over a span of months to years, usually between the ages of 48 and 55. Menopause is complete when you have missed 12 consecutive menstrual periods. It is important to talk with your health care provider about some of the most common conditions that affect postmenopausal women, such as heart disease, cancer, and bone loss (osteoporosis). Adopting a healthy lifestyle and getting preventive care can help to promote your health and wellness. Those actions can also lower your chances of developing some of these common conditions. What should I know about menopause? During menopause, you may experience a number of symptoms, such as:  Moderate-to-severe hot flashes.  Night sweats.  Decrease in sex drive.  Mood swings.  Headaches.  Tiredness.  Irritability.  Memory problems.  Insomnia.  Choosing to treat or not to treat menopausal changes is an individual decision that you make with your health care provider. What should I know about hormone replacement therapy and supplements? Hormone therapy products are effective for treating symptoms that are associated with menopause, such as hot flashes and night sweats. Hormone replacement carries certain risks, especially as you become older. If you are thinking about using estrogen or estrogen with progestin treatments, discuss the benefits and risks with your health care provider. What should I know about heart disease and stroke? Heart disease, heart attack, and stroke become more likely as you age. This may be due, in part, to the hormonal changes that your body experiences during menopause. These can affect how your  body processes dietary fats, triglycerides, and cholesterol. Heart attack and stroke are both medical emergencies. There are many things that you can do to help prevent heart disease and stroke:  Have your blood pressure checked at least every 1-2 years. High blood pressure causes heart disease and increases the risk of stroke.  If you are 55-79 years old, ask your health care provider if you should take aspirin to prevent a heart attack or a stroke.  Do not use any tobacco products, including cigarettes, chewing tobacco, or electronic cigarettes. If you need help quitting, ask your health care provider.  It is important to eat a healthy diet and maintain a healthy weight. ? Be sure to include plenty of vegetables, fruits, low-fat dairy products, and lean protein. ? Avoid eating foods that are high in solid fats, added sugars, or salt (sodium).  Get regular exercise. This is one of the most important things that you can do for your health. ? Try to exercise for at least 150 minutes each week. The type of exercise that you do should increase your heart rate and make you sweat. This is known as moderate-intensity exercise. ? Try to do strengthening exercises at least twice each week. Do these in addition to the moderate-intensity exercise.  Know your numbers.Ask your health care provider to check your cholesterol and your blood glucose. Continue to have your blood tested as directed by your health care provider.  What should I know about cancer screening? There are several types of cancer. Take the following steps to reduce your risk and to catch any cancer development as early as possible. Breast Cancer  Practice breast self-awareness. ? This means understanding how your breasts   normally appear and feel. ? It also means doing regular breast self-exams. Let your health care provider know about any changes, no matter how small.  If you are 49 or older, have a clinician do a breast exam  (clinical breast exam or CBE) every year. Depending on your age, family history, and medical history, it may be recommended that you also have a yearly breast X-ray (mammogram).  If you have a family history of breast cancer, talk with your health care provider about genetic screening.  If you are at high risk for breast cancer, talk with your health care provider about having an MRI and a mammogram every year.  Breast cancer (BRCA) gene test is recommended for women who have family members with BRCA-related cancers. Results of the assessment will determine the need for genetic counseling and BRCA1 and for BRCA2 testing. BRCA-related cancers include these types: ? Breast. This occurs in males or females. ? Ovarian. ? Tubal. This may also be called fallopian tube cancer. ? Cancer of the abdominal or pelvic lining (peritoneal cancer). ? Prostate. ? Pancreatic.  Cervical, Uterine, and Ovarian Cancer Your health care provider may recommend that you be screened regularly for cancer of the pelvic organs. These include your ovaries, uterus, and vagina. This screening involves a pelvic exam, which includes checking for microscopic changes to the surface of your cervix (Pap test).  For women ages 21-65, health care providers may recommend a pelvic exam and a Pap test every three years. For women ages 31-65, they may recommend the Pap test and pelvic exam, combined with testing for human papilloma virus (HPV), every five years. Some types of HPV increase your risk of cervical cancer. Testing for HPV may also be done on women of any age who have unclear Pap test results.  Other health care providers may not recommend any screening for nonpregnant women who are considered low risk for pelvic cancer and have no symptoms. Ask your health care provider if a screening pelvic exam is right for you.  If you have had past treatment for cervical cancer or a condition that could lead to cancer, you need Pap tests  and screening for cancer for at least 20 years after your treatment. If Pap tests have been discontinued for you, your risk factors (such as having a new sexual partner) need to be reassessed to determine if you should start having screenings again. Some women have medical problems that increase the chance of getting cervical cancer. In these cases, your health care provider may recommend that you have screening and Pap tests more often.  If you have a family history of uterine cancer or ovarian cancer, talk with your health care provider about genetic screening.  If you have vaginal bleeding after reaching menopause, tell your health care provider.  There are currently no reliable tests available to screen for ovarian cancer.  Lung Cancer Lung cancer screening is recommended for adults 79-5 years old who are at high risk for lung cancer because of a history of smoking. A yearly low-dose CT scan of the lungs is recommended if you:  Currently smoke.  Have a history of at least 30 pack-years of smoking and you currently smoke or have quit within the past 15 years. A pack-year is smoking an average of one pack of cigarettes per day for one year.  Yearly screening should:  Continue until it has been 15 years since you quit.  Stop if you develop a health problem that would prevent  you from having lung cancer treatment.  Colorectal Cancer  This type of cancer can be detected and can often be prevented.  Routine colorectal cancer screening usually begins at age 50 and continues through age 75.  If you have risk factors for colon cancer, your health care provider may recommend that you be screened at an earlier age.  If you have a family history of colorectal cancer, talk with your health care provider about genetic screening.  Your health care provider may also recommend using home test kits to check for hidden blood in your stool.  A small camera at the end of a tube can be used to  examine your colon directly (sigmoidoscopy or colonoscopy). This is done to check for the earliest forms of colorectal cancer.  Direct examination of the colon should be repeated every 5-10 years until age 75. However, if early forms of precancerous polyps or small growths are found or if you have a family history or genetic risk for colorectal cancer, you may need to be screened more often.  Skin Cancer  Check your skin from head to toe regularly.  Monitor any moles. Be sure to tell your health care provider: ? About any new moles or changes in moles, especially if there is a change in a mole's shape or color. ? If you have a mole that is larger than the size of a pencil eraser.  If any of your family members has a history of skin cancer, especially at a young age, talk with your health care provider about genetic screening.  Always use sunscreen. Apply sunscreen liberally and repeatedly throughout the day.  Whenever you are outside, protect yourself by wearing long sleeves, pants, a wide-brimmed hat, and sunglasses.  What should I know about osteoporosis? Osteoporosis is a condition in which bone destruction happens more quickly than new bone creation. After menopause, you may be at an increased risk for osteoporosis. To help prevent osteoporosis or the bone fractures that can happen because of osteoporosis, the following is recommended:  If you are 19-50 years old, get at least 1,000 mg of calcium and at least 600 mg of vitamin D per day.  If you are older than age 50 but younger than age 70, get at least 1,200 mg of calcium and at least 600 mg of vitamin D per day.  If you are older than age 70, get at least 1,200 mg of calcium and at least 800 mg of vitamin D per day.  Smoking and excessive alcohol intake increase the risk of osteoporosis. Eat foods that are rich in calcium and vitamin D, and do weight-bearing exercises several times each week as directed by your health care  provider. What should I know about how menopause affects my mental health? Depression may occur at any age, but it is more common as you become older. Common symptoms of depression include:  Low or sad mood.  Changes in sleep patterns.  Changes in appetite or eating patterns.  Feeling an overall lack of motivation or enjoyment of activities that you previously enjoyed.  Frequent crying spells.  Talk with your health care provider if you think that you are experiencing depression. What should I know about immunizations? It is important that you get and maintain your immunizations. These include:  Tetanus, diphtheria, and pertussis (Tdap) booster vaccine.  Influenza every year before the flu season begins.  Pneumonia vaccine.  Shingles vaccine.  Your health care provider may also recommend other immunizations. This information   is not intended to replace advice given to you by your health care provider. Make sure you discuss any questions you have with your health care provider. Document Released: 04/18/2005 Document Revised: 09/14/2015 Document Reviewed: 11/28/2014 Elsevier Interactive Patient Education  2018 Vanessa Casey. Pneumococcal Polysaccharide Vaccine: What You Need to Know 1. Why get vaccinated? Vaccination can protect older adults (and some children and younger adults) from pneumococcal disease. Pneumococcal disease is caused by bacteria that can spread from person to person through close contact. It can cause ear infections, and it can also lead to more serious infections of the:  Lungs (pneumonia),  Blood (bacteremia), and  Covering of the brain and spinal cord (meningitis). Meningitis can cause deafness and brain damage, and it can be fatal.  Anyone can get pneumococcal disease, but children under 99 years of age, people with certain medical conditions, adults over 31 years of age, and cigarette smokers are at the highest risk. About 18,000 older adults die each  year from pneumococcal disease in the [place]. Treatment of pneumococcal infections with penicillin and other drugs used to be more effective. But some strains of the disease have become resistant to these drugs. This makes prevention of the disease, through vaccination, even more important. 2. Pneumococcal polysaccharide vaccine (PPSV23) Pneumococcal polysaccharide vaccine (PPSV23) protects against 23 types of pneumococcal bacteria. It will not prevent all pneumococcal disease. PPSV23 is recommended for:  All adults 12 years of age and older,  Anyone 2 through 66 years of age with certain long-term health problems,  Anyone 2 through 66 years of age with a weakened immune system,  Adults 46 through 66 years of age who smoke cigarettes or have asthma.  Most people need only one dose of PPSV. A second dose is recommended for certain high-risk groups. People 69 and older should get a dose even if they have gotten one or more doses of the vaccine before they turned 65. Your healthcare provider can give you more information about these recommendations. Most healthy adults develop protection within 2 to 3 weeks of getting the shot. 3. Some people should not get this vaccine  Anyone who has had a life-threatening allergic reaction to PPSV should not get another dose.  Anyone who has a severe allergy to any component of PPSV should not receive it. Tell your provider if you have any severe allergies.  Anyone who is moderately or severely ill when the shot is scheduled may be asked to wait until they recover before getting the vaccine. Someone with a mild illness can usually be vaccinated.  Children less than 101 years of age should not receive this vaccine.  There is no evidence that PPSV is harmful to either a pregnant woman or to her fetus. However, as a precaution, women who need the vaccine should be vaccinated before becoming pregnant, if possible. 4. Risks of a vaccine reaction With any  medicine, including vaccines, there is a chance of side effects. These are usually mild and go away on their own, but serious reactions are also possible. About half of people who get PPSV have mild side effects, such as redness or pain where the shot is given, which go away within about two days. Less than 1 out of 100 people develop a fever, muscle aches, or more severe local reactions. Problems that could happen after any vaccine:  People sometimes faint after a medical procedure, including vaccination. Sitting or lying down for about 15 minutes can help prevent fainting, and injuries caused  by a fall. Tell your doctor if you feel dizzy, or have vision changes or ringing in the ears.  Some people get severe pain in the shoulder and have difficulty moving the arm where a shot was given. This happens very rarely.  Any medication can cause a severe allergic reaction. Such reactions from a vaccine are very rare, estimated at about 1 in a million doses, and would happen within a few minutes to a few hours after the vaccination. As with any medicine, there is a very remote chance of a vaccine causing a serious injury or death. The safety of vaccines is always being monitored. For more information, visit: www.cdc.gov/vaccinesafety/ 5. What if there is a serious reaction? What should I look for? Look for anything that concerns you, such as signs of a severe allergic reaction, very high fever, or unusual behavior. Signs of a severe allergic reaction can include hives, swelling of the face and throat, difficulty breathing, a fast heartbeat, dizziness, and weakness. These would usually start a few minutes to a few hours after the vaccination. What should I do? If you think it is a severe allergic reaction or other emergency that can't wait, call 9-1-1 or get to the nearest hospital. Otherwise, call your doctor. Afterward, the reaction should be reported to the Vaccine Adverse Event Reporting System  (VAERS). Your doctor might file this report, or you can do it yourself through the VAERS web site at www.vaers.hhs.gov, or by calling 1-800-822-7967. VAERS does not give medical advice. 6. How can I learn more?  Ask your doctor. He or she can give you the vaccine package insert or suggest other sources of information.  Call your local or state health department.  Contact the Centers for Disease Control and Prevention (CDC): ? Call 1-800-232-4636 (1-800-CDC-INFO) or ? Visit CDC's website at www.cdc.gov/vaccines CDC Pneumococcal Polysaccharide Vaccine VIS (07/01/13) This information is not intended to replace advice given to you by your health care provider. Make sure you discuss any questions you have with your health care provider. Document Released: 12/22/2005 Document Revised: 11/15/2015 Document Reviewed: 11/15/2015 Elsevier Interactive Patient Education  2017 Elsevier Inc.  

## 2018-01-15 ENCOUNTER — Encounter: Payer: Self-pay | Admitting: Family Medicine

## 2018-01-15 LAB — COMPREHENSIVE METABOLIC PANEL
ALT: 12 IU/L (ref 0–32)
AST: 15 IU/L (ref 0–40)
Albumin/Globulin Ratio: 1.5 (ref 1.2–2.2)
Albumin: 4.2 g/dL (ref 3.6–4.8)
Alkaline Phosphatase: 100 IU/L (ref 39–117)
BUN/Creatinine Ratio: 20 (ref 12–28)
BUN: 15 mg/dL (ref 8–27)
CHLORIDE: 103 mmol/L (ref 96–106)
CO2: 23 mmol/L (ref 20–29)
Calcium: 9 mg/dL (ref 8.7–10.3)
Creatinine, Ser: 0.76 mg/dL (ref 0.57–1.00)
GFR calc Af Amer: 95 mL/min/{1.73_m2} (ref >59)
GFR calc non Af Amer: 82 mL/min/{1.73_m2} (ref >59)
Globulin, Total: 2.8 g/dL (ref 1.5–4.5)
Glucose: 93 mg/dL (ref 65–99)
POTASSIUM: 3.9 mmol/L (ref 3.5–5.2)
SODIUM: 140 mmol/L (ref 134–144)
Total Protein: 7 g/dL (ref 6.0–8.5)

## 2018-01-15 LAB — CBC WITH DIFFERENTIAL/PLATELET
BASOS: 1 %
Basophils Absolute: 0 10*3/uL (ref 0.0–0.2)
EOS (ABSOLUTE): 0.1 10*3/uL (ref 0.0–0.4)
Eos: 1 %
Hematocrit: 34.3 % (ref 34.0–46.6)
Hemoglobin: 11.3 g/dL (ref 11.1–15.9)
Immature Grans (Abs): 0 10*3/uL (ref 0.0–0.1)
Immature Granulocytes: 0 %
LYMPHS ABS: 1.2 10*3/uL (ref 0.7–3.1)
Lymphs: 24 %
MCH: 27 pg (ref 26.6–33.0)
MCHC: 32.9 g/dL (ref 31.5–35.7)
MCV: 82 fL (ref 79–97)
MONOS ABS: 0.5 10*3/uL (ref 0.1–0.9)
Monocytes: 10 %
NEUTROS ABS: 3.2 10*3/uL (ref 1.4–7.0)
Neutrophils: 64 %
PLATELETS: 225 10*3/uL (ref 150–450)
RBC: 4.19 x10E6/uL (ref 3.77–5.28)
RDW: 13.9 % (ref 12.3–15.4)
WBC: 5 10*3/uL (ref 3.4–10.8)

## 2018-01-15 LAB — TSH: TSH: 3.13 u[IU]/mL (ref 0.450–4.500)

## 2018-01-15 LAB — LIPID PANEL W/O CHOL/HDL RATIO
Cholesterol, Total: 187 mg/dL (ref 100–199)
HDL: 44 mg/dL (ref >39)
LDL CALC: 97 mg/dL (ref 0–99)
TRIGLYCERIDES: 228 mg/dL — AB (ref 0–149)
VLDL Cholesterol Cal: 46 mg/dL — ABNORMAL HIGH (ref 5–40)

## 2018-02-08 ENCOUNTER — Ambulatory Visit (INDEPENDENT_AMBULATORY_CARE_PROVIDER_SITE_OTHER): Payer: PPO | Admitting: Family Medicine

## 2018-02-08 ENCOUNTER — Encounter: Payer: Self-pay | Admitting: Family Medicine

## 2018-02-08 VITALS — BP 149/75 | HR 71 | Temp 98.8°F | Wt 168.0 lb

## 2018-02-08 DIAGNOSIS — J040 Acute laryngitis: Secondary | ICD-10-CM

## 2018-02-08 MED ORDER — PREDNISONE 50 MG PO TABS: 50.0000 mg | ORAL_TABLET | Freq: Every day | ORAL | 0 refills | Status: DC

## 2018-02-08 NOTE — Progress Notes (Signed)
BP (!) 149/75   Pulse 71   Temp 98.8 F (37.1 C) (Oral)   Wt 168 lb (76.2 kg)   SpO2 97%   BMI 32.81 kg/m    Subjective:    Patient ID: Vanessa Casey, female    DOB: Apr 21, 1950, 67 y.o.   MRN: [id number]  HPI: Vanessa Casey is a 67 y.o. female  Chief Complaint  Patient presents with  . URI    pt states she has had a cough, congestion, sinus pressure, runny nose, and drainage since Thursday evening.    UPPER RESPIRATORY TRACT INFECTION Duration: 5 days Worst symptom: laryngitis Fever: yes Cough: yes Shortness of breath: no Wheezing: no Chest pain: no Chest tightness: no Chest congestion: no Nasal congestion: yes Runny nose: yes Post nasal drip: yes Sneezing: yes Sore throat: yes Swollen glands: no Sinus pressure: no Headache: yes Face pain: no Toothache: no Ear pain: no  Ear pressure: no  Eyes red/itching:yes Eye drainage/crusting: no  Vomiting: no Rash: no Fatigue: yes Sick contacts: yes Strep contacts: no  Context: stable Recurrent sinusitis: no Relief with OTC cold/cough medications: no  Treatments attempted: cold/sinus, mucinex and cough syrup   Relevant past medical, surgical, family and social history reviewed and updated as indicated. Interim medical history since our last visit reviewed. Allergies and medications reviewed and updated.  Review of Systems  Per HPI unless specifically indicated above     Objective:    BP (!) 149/75   Pulse 71   Temp 98.8 F (37.1 C) (Oral)   Wt 168 lb (76.2 kg)   SpO2 97%   BMI 32.81 kg/m   Wt Readings from Last 3 Encounters:  02/08/18 168 lb (76.2 kg)  01/14/18 169 lb (76.7 kg)  01/14/18 169 lb 14.4 oz (77.1 kg)    Physical Exam  Results for orders placed or performed in visit on 01/14/18  Microscopic Examination  Result Value Ref Range   WBC, UA None seen 0 - 5 /hpf   RBC, UA 0-2 0 - 2 /hpf   Epithelial Cells (non renal) 0-10 0 - 10 /hpf   Bacteria, UA None seen None seen/Few  CBC with  Differential/Platelet  Result Value Ref Range   WBC 5.0 3.4 - 10.8 x10E3/uL   RBC 4.19 3.77 - 5.28 x10E6/uL   Hemoglobin 11.3 11.1 - 15.9 g/dL   Hematocrit 34.3 34.0 - 46.6 %   MCV 82 79 - 97 fL   MCH 27.0 26.6 - 33.0 pg   MCHC 32.9 31.5 - 35.7 g/dL   RDW 13.9 12.3 - 15.4 %   Platelets 225 150 - 450 x10E3/uL   Neutrophils 64 Not Estab. %   Lymphs 24 Not Estab. %   Monocytes 10 Not Estab. %   Eos 1 Not Estab. %   Basos 1 Not Estab. %   Neutrophils Absolute 3.2 1.4 - 7.0 x10E3/uL   Lymphocytes Absolute 1.2 0.7 - 3.1 x10E3/uL   Monocytes Absolute 0.5 0.1 - 0.9 x10E3/uL   EOS (ABSOLUTE) 0.1 0.0 - 0.4 x10E3/uL   Basophils Absolute 0.0 0.0 - 0.2 x10E3/uL   Immature Granulocytes 0 Not Estab. %   Immature Grans (Abs) 0.0 0.0 - 0.1 x10E3/uL  Comprehensive metabolic panel  Result Value Ref Range   Glucose 93 65 - 99 mg/dL   BUN 15 8 - 27 mg/dL   Creatinine, Ser 0.76 0.57 - 1.00 mg/dL   GFR calc non Af Amer 82 >59 mL/min/1.73   GFR calc Af Amer 95 >  59 mL/min/1.73   BUN/Creatinine Ratio 20 12 - 28   Sodium 140 134 - 144 mmol/L   Potassium 3.9 3.5 - 5.2 mmol/L   Chloride 103 96 - 106 mmol/L   CO2 23 20 - 29 mmol/L   Calcium 9.0 8.7 - 10.3 mg/dL   Total Protein 7.0 6.0 - 8.5 g/dL   Albumin 4.2 3.6 - 4.8 g/dL   Globulin, Total 2.8 1.5 - 4.5 g/dL   Albumin/Globulin Ratio 1.5 1.2 - 2.2   Bilirubin Total <0.2 0.0 - 1.2 mg/dL   Alkaline Phosphatase 100 39 - 117 IU/L   AST 15 0 - 40 IU/L   ALT 12 0 - 32 IU/L  Lipid Panel w/o Chol/HDL Ratio  Result Value Ref Range   Cholesterol, Total 187 100 - 199 mg/dL   Triglycerides 228 (H) 0 - 149 mg/dL   HDL 44 >39 mg/dL   VLDL Cholesterol Cal 46 (H) 5 - 40 mg/dL   LDL Calculated 97 0 - 99 mg/dL  TSH  Result Value Ref Range   TSH 3.130 0.450 - 4.500 uIU/mL  UA/M w/rflx Culture, Routine  Result Value Ref Range   Specific Gravity, UA <1.005 (L) 1.005 - 1.030   pH, UA 5.5 5.0 - 7.5   Color, UA Yellow Yellow   Appearance Ur Clear Clear    Leukocytes, UA Negative Negative   Protein, UA Negative Negative/Trace   Glucose, UA Negative Negative   Ketones, UA Negative Negative   RBC, UA 3+ (A) Negative   Bilirubin, UA Negative Negative   Urobilinogen, Ur 0.2 0.2 - 1.0 mg/dL   Nitrite, UA Negative Negative   Microscopic Examination See below:   Cologuard  Result Value Ref Range   Cologuard Negative       Assessment & Plan:   Problem List Items Addressed This Visit    None    Visit Diagnoses    Laryngitis    -  Primary   Will treat with burst of prednisone. Call with any concerns. Continue to monitor.        Follow up plan: Return if symptoms worsen or fail to improve.

## 2018-03-03 ENCOUNTER — Encounter: Payer: Self-pay | Admitting: *Deleted

## 2018-03-03 ENCOUNTER — Emergency Department: Payer: PPO

## 2018-03-03 ENCOUNTER — Inpatient Hospital Stay
Admission: EM | Admit: 2018-03-03 | Discharge: 2018-03-06 | DRG: 416 | Disposition: A | Discharge status: Home / Self Care | Payer: PPO | Attending: Surgery | Admitting: Surgery

## 2018-03-03 ENCOUNTER — Other Ambulatory Visit: Payer: Self-pay

## 2018-03-03 DIAGNOSIS — Z7952 Long term (current) use of systemic steroids: Secondary | ICD-10-CM | POA: Diagnosis not present

## 2018-03-03 DIAGNOSIS — R079 Chest pain, unspecified: Secondary | ICD-10-CM | POA: Diagnosis not present

## 2018-03-03 DIAGNOSIS — I10 Essential (primary) hypertension: Secondary | ICD-10-CM | POA: Diagnosis not present

## 2018-03-03 DIAGNOSIS — R1011 Right upper quadrant pain: Secondary | ICD-10-CM | POA: Diagnosis not present

## 2018-03-03 DIAGNOSIS — K82A1 Gangrene of gallbladder in cholecystitis: Secondary | ICD-10-CM | POA: Diagnosis present

## 2018-03-03 DIAGNOSIS — K810 Acute cholecystitis: Secondary | ICD-10-CM | POA: Diagnosis present

## 2018-03-03 DIAGNOSIS — K8012 Calculus of gallbladder with acute and chronic cholecystitis without obstruction: Secondary | ICD-10-CM | POA: Diagnosis not present

## 2018-03-03 DIAGNOSIS — Z5331 Laparoscopic surgical procedure converted to open procedure: Secondary | ICD-10-CM

## 2018-03-03 DIAGNOSIS — Z85828 Personal history of other malignant neoplasm of skin: Secondary | ICD-10-CM | POA: Diagnosis not present

## 2018-03-03 DIAGNOSIS — K819 Cholecystitis, unspecified: Secondary | ICD-10-CM

## 2018-03-03 LAB — BASIC METABOLIC PANEL
Anion gap: 11 (ref 5–15)
BUN: 16 mg/dL (ref 8–23)
CALCIUM: 8.8 mg/dL — AB (ref 8.9–10.3)
CHLORIDE: 104 mmol/L (ref 98–111)
CO2: 22 mmol/L (ref 22–32)
CREATININE: 0.58 mg/dL (ref 0.44–1.00)
GFR calc non Af Amer: >60 mL/min (ref >60)
Glucose, Bld: 138 mg/dL — ABNORMAL HIGH (ref 70–99)
Potassium: 3.3 mmol/L — ABNORMAL LOW (ref 3.5–5.1)
SODIUM: 137 mmol/L (ref 135–145)

## 2018-03-03 LAB — SURGICAL PCR SCREEN
MRSA, PCR: NEGATIVE
Staphylococcus aureus: POSITIVE — AB

## 2018-03-03 LAB — CBC
HCT: 39.4 % (ref 36.0–46.0)
Hemoglobin: 12.6 g/dL (ref 12.0–15.0)
MCH: 27.2 pg (ref 26.0–34.0)
MCHC: 32 g/dL (ref 30.0–36.0)
MCV: 84.9 fL (ref 80.0–100.0)
NRBC: 0 % (ref 0.0–0.2)
Platelets: 241 10*3/uL (ref 150–400)
RBC: 4.64 MIL/uL (ref 3.87–5.11)
RDW: 13.5 % (ref 11.5–15.5)
WBC: 12 10*3/uL — AB (ref 4.0–10.5)

## 2018-03-03 LAB — LIPASE, BLOOD: Lipase: 31 U/L (ref 11–51)

## 2018-03-03 LAB — HEPATIC FUNCTION PANEL
ALT: 19 U/L (ref 0–44)
AST: 25 U/L (ref 15–41)
Albumin: 4.3 g/dL (ref 3.5–5.0)
Alkaline Phosphatase: 78 U/L (ref 38–126)
TOTAL PROTEIN: 8 g/dL (ref 6.5–8.1)
Total Bilirubin: 0.5 mg/dL (ref 0.3–1.2)

## 2018-03-03 LAB — TROPONIN I

## 2018-03-03 MED ORDER — ACETAMINOPHEN 500 MG PO TABS
1000.0000 mg | ORAL_TABLET | Freq: Four times a day (QID) | ORAL | Status: DC
  Administered 2018-03-03 – 2018-03-06 (×10): 1000 mg via ORAL
  Filled 2018-03-03 (×11): qty 2

## 2018-03-03 MED ORDER — SODIUM CHLORIDE 0.9 % IV BOLUS
1000.0000 mL | Freq: Once | INTRAVENOUS | Status: AC
  Administered 2018-03-03: 1000 mL via INTRAVENOUS

## 2018-03-03 MED ORDER — PANTOPRAZOLE SODIUM 40 MG IV SOLR
40.0000 mg | Freq: Every day | INTRAVENOUS | Status: DC
  Administered 2018-03-03 – 2018-03-04 (×2): 40 mg via INTRAVENOUS
  Filled 2018-03-03 (×2): qty 40

## 2018-03-03 MED ORDER — MORPHINE SULFATE (PF) 2 MG/ML IV SOLN
2.0000 mg | INTRAVENOUS | Status: DC | PRN
  Administered 2018-03-03 – 2018-03-04 (×3): 2 mg via INTRAVENOUS
  Filled 2018-03-03 (×3): qty 1

## 2018-03-03 MED ORDER — DIPHENHYDRAMINE HCL 50 MG/ML IJ SOLN: 12.5000 mg | Freq: Four times a day (QID) | INTRAMUSCULAR | Status: DC | PRN

## 2018-03-03 MED ORDER — ONDANSETRON HCL 4 MG/2ML IJ SOLN
4.0000 mg | Freq: Four times a day (QID) | INTRAMUSCULAR | Status: DC | PRN
  Administered 2018-03-04: 4 mg via INTRAVENOUS
  Filled 2018-03-03: qty 2

## 2018-03-03 MED ORDER — KETOROLAC TROMETHAMINE 15 MG/ML IJ SOLN
15.0000 mg | Freq: Four times a day (QID) | INTRAMUSCULAR | Status: DC | PRN
  Filled 2018-03-03: qty 1

## 2018-03-03 MED ORDER — PIPERACILLIN-TAZOBACTAM 3.375 G IVPB
3.3750 g | Freq: Three times a day (TID) | INTRAVENOUS | Status: DC
  Administered 2018-03-03 – 2018-03-05 (×5): 3.375 g via INTRAVENOUS
  Filled 2018-03-03 (×6): qty 50

## 2018-03-03 MED ORDER — MORPHINE SULFATE (PF) 4 MG/ML IV SOLN
4.0000 mg | Freq: Once | INTRAVENOUS | Status: AC
  Administered 2018-03-03: 4 mg via INTRAVENOUS
  Filled 2018-03-03: qty 1

## 2018-03-03 MED ORDER — ONDANSETRON HCL 4 MG/2ML IJ SOLN
4.0000 mg | Freq: Once | INTRAMUSCULAR | Status: AC
  Administered 2018-03-03: 4 mg via INTRAVENOUS
  Filled 2018-03-03: qty 2

## 2018-03-03 MED ORDER — KCL IN DEXTROSE-NACL 20-5-0.9 MEQ/L-%-% IV SOLN
INTRAVENOUS | Status: DC
  Administered 2018-03-03 – 2018-03-04 (×3): via INTRAVENOUS
  Administered 2018-03-04: 300 mL via INTRAVENOUS
  Administered 2018-03-05: 05:00:00 via INTRAVENOUS
  Filled 2018-03-03 (×8): qty 1000

## 2018-03-03 MED ORDER — ONDANSETRON HCL 4 MG/2ML IJ SOLN
INTRAMUSCULAR | Status: AC
  Administered 2018-03-03: 4 mg via INTRAVENOUS
  Filled 2018-03-03: qty 2

## 2018-03-03 MED ORDER — ONDANSETRON 4 MG PO TBDP: 4.0000 mg | ORAL_TABLET | Freq: Four times a day (QID) | ORAL | Status: DC | PRN

## 2018-03-03 MED ORDER — CHLORHEXIDINE GLUCONATE CLOTH 2 % EX PADS
6.0000 | MEDICATED_PAD | Freq: Every day | CUTANEOUS | Status: DC
  Administered 2018-03-04 – 2018-03-05 (×2): 6 via TOPICAL

## 2018-03-03 MED ORDER — DIPHENHYDRAMINE HCL 12.5 MG/5ML PO ELIX
12.5000 mg | ORAL_SOLUTION | Freq: Four times a day (QID) | ORAL | Status: DC | PRN
  Filled 2018-03-03: qty 5

## 2018-03-03 MED ORDER — ONDANSETRON HCL 4 MG/2ML IJ SOLN: 4.0000 mg | Freq: Once | INTRAMUSCULAR | Status: DC

## 2018-03-03 MED ORDER — MORPHINE SULFATE (PF) 4 MG/ML IV SOLN: 4.0000 mg | Freq: Once | INTRAVENOUS | Status: DC

## 2018-03-03 MED ORDER — HYDRALAZINE HCL 20 MG/ML IJ SOLN: 10.0000 mg | INTRAMUSCULAR | Status: DC | PRN

## 2018-03-03 MED ORDER — SODIUM CHLORIDE 0.9 % IV SOLN
2.0000 g | Freq: Every day | INTRAVENOUS | Status: DC
  Administered 2018-03-03: 2 g via INTRAVENOUS
  Filled 2018-03-03: qty 2
  Filled 2018-03-03 (×2): qty 20

## 2018-03-03 MED ORDER — PROCHLORPERAZINE EDISYLATE 10 MG/2ML IJ SOLN
5.0000 mg | Freq: Four times a day (QID) | INTRAMUSCULAR | Status: DC | PRN
  Filled 2018-03-03: qty 2

## 2018-03-03 MED ORDER — SODIUM CHLORIDE 0.9 % IV SOLN
12.5000 mg | Freq: Once | INTRAVENOUS | Status: DC
  Filled 2018-03-03: qty 0.5

## 2018-03-03 MED ORDER — MUPIROCIN 2 % EX OINT
1.0000 "application " | TOPICAL_OINTMENT | Freq: Two times a day (BID) | CUTANEOUS | Status: DC
  Administered 2018-03-04 – 2018-03-05 (×4): 1 via NASAL
  Filled 2018-03-03: qty 22

## 2018-03-03 MED ORDER — PROCHLORPERAZINE MALEATE 10 MG PO TABS
10.0000 mg | ORAL_TABLET | Freq: Four times a day (QID) | ORAL | Status: DC | PRN
  Filled 2018-03-03: qty 1

## 2018-03-03 MED ORDER — HEPARIN SODIUM (PORCINE) 5000 UNIT/ML IJ SOLN
5000.0000 [IU] | Freq: Three times a day (TID) | INTRAMUSCULAR | Status: DC
  Administered 2018-03-03 – 2018-03-06 (×7): 5000 [IU] via SUBCUTANEOUS
  Filled 2018-03-03 (×7): qty 1

## 2018-03-03 MED ORDER — ONDANSETRON HCL 4 MG/2ML IJ SOLN
4.0000 mg | Freq: Once | INTRAMUSCULAR | Status: AC | PRN
  Administered 2018-03-03: 4 mg via INTRAVENOUS

## 2018-03-03 NOTE — ED Notes (Signed)
Page sent for Dr. Dahlia Byes regarding possible duplicate abt per pharmacy and possibility of changing phenergan infusion to IVP. Awaiting callback.

## 2018-03-03 NOTE — ED Triage Notes (Signed)
Pt to ED reporting centralized chest pain with no radiation that started at 6 this morning. Intermittent SOB reported. Vomiting in triage. No fevers. NO cardiac hx

## 2018-03-03 NOTE — ED Provider Notes (Signed)
[facility] Emergency Department Provider Note  ____________________________________________   First MD Initiated Contact with Patient 03/03/18 1233     (approximate)  I have reviewed the triage vital signs and the nursing notes.   HISTORY  Chief Complaint Chest Pain and Emesis   HPI Vanessa Casey is a 67 y.o. female with 2 months of intermittent upper abdominal pain was presented emergency department now with chest pain as well as right upper quadrant abdominal pain that started at 6 AM this morning.  She states the pain is 8 out of 10 and feels like a pressure.  She is also had several episodes of vomiting.  Denies any diarrhea.  Denies any radiation of the pain.  Says that she was having difficulty breathing earlier but this has since abated.  Says that she still has her gallbladder.  Says that the pain is to the center of her chest.   Past Medical History:  Diagnosis Date  . Edema    FEET/ LEGS  . Medical history non-contributory   . Post-operative nausea and vomiting 04/30/2017  . Shingles     Patient Active Problem List   Diagnosis Date Noted  . Post-operative nausea and vomiting 04/30/2017  . Advance directive discussed with patient 01/12/2017    Past Surgical History:  Procedure Laterality Date  . ABDOMINAL HYSTERECTOMY    . BASAL CELL CARCINOMA EXCISION    . CATARACT EXTRACTION W/PHACO Right 05/05/2017   Procedure: CATARACT EXTRACTION PHACO AND INTRAOCULAR LENS PLACEMENT (IOC);  Surgeon: Birder Robson, MD;  Location: [facility] ORS;  Service: Ophthalmology;  Laterality: Right;  [place] 00:33.2 AP% 15.8 CDE 5.25 Fluid Pack Lot # [id number]  . CATARACT EXTRACTION W/PHACO Left 06/09/2017   Procedure: CATARACT EXTRACTION PHACO AND INTRAOCULAR LENS PLACEMENT ([place]);  Surgeon: Birder Robson, MD;  Location: [facility] ORS;  Service: Ophthalmology;  Laterality: Left;  [place] 01:02.2 AP% 15.2 CDE 9.48 Fluid Pack Lot # [id number]    Prior to Admission medications    Medication Sig Start Date End Date Taking? Authorizing Provider  ibuprofen (ADVIL,MOTRIN) 200 MG tablet Take 400 mg by mouth every 6 (six) hours as needed for headache or moderate pain.    [provider]  predniSONE (DELTASONE) 50 MG tablet Take 1 tablet (50 mg total) by mouth daily with breakfast. 02/08/18   Park Liter P, DO    Allergies Patient has no known allergies.  Family History  Problem Relation Age of Onset  . Dementia Mother   . Heart failure Father   . Heart attack Sister   . Heart attack Brother   . Breast cancer Maternal Aunt     Social History Social History   Tobacco Use  . Smoking status: Never Smoker  . Smokeless tobacco: Never Used  Substance Use Topics  . Alcohol use: No  . Drug use: No    Review of Systems  Constitutional: No fever/chills Eyes: No visual changes. ENT: No sore throat. Cardiovascular: As above. Respiratory: As above Gastrointestinal:   No diarrhea.  No constipation. Genitourinary: Negative for dysuria. Musculoskeletal: Negative for back pain. Skin: Negative for rash. Neurological: Negative for headaches, focal weakness or numbness.   ____________________________________________   PHYSICAL EXAM:  VITAL SIGNS: ED Triage Vitals  Enc Vitals Group     BP 03/03/18 1221 (!) 159/59     Pulse Rate 03/03/18 1221 (!) 101     Resp 03/03/18 1221 16     Temp 03/03/18 1221 98.4 F (36.9 C)  Temp Source 03/03/18 1221 Oral     SpO2 03/03/18 1221 100 %     Weight 03/03/18 1222 167 lb 15.9 oz (76.2 kg)     Height --      Head Circumference --      Peak Flow --      Pain Score 03/03/18 1222 10     Pain Loc --      Pain Edu? --      Excl. in [place]? --     Constitutional: Alert and oriented. Well appearing and in no acute distress. Eyes: Conjunctivae are normal.  Head: Atraumatic. Nose: No congestion/rhinnorhea. Mouth/Throat: Mucous membranes are moist.  Neck: No stridor.   Cardiovascular: Normal rate, regular  rhythm. Grossly normal heart sounds.  Good peripheral circulation equal and bilateral radial as well as dorsalis pedis pulses.  Chest pain is not reproducible to palpation. Respiratory: Normal respiratory effort.  No retractions. Lungs CTAB. Gastrointestinal: Soft but with epigastric as well as right upper quadrant tenderness to palpation with a positive Murphy sign.  No distention.  Musculoskeletal: No lower extremity tenderness nor edema.  No joint effusions. Neurologic:  Normal speech and language. No gross focal neurologic deficits are appreciated. Skin:  Skin is warm, dry and intact. No rash noted. Psychiatric: Mood and affect are normal. Speech and behavior are normal.  ____________________________________________   LABS (all labs ordered are listed, but only abnormal results are displayed)  Labs Reviewed  BASIC METABOLIC PANEL - Abnormal; Notable for the following components:      Result Value   Potassium 3.3 (*)    Glucose, Bld 138 (*)    Calcium 8.8 (*)    All other components within normal limits  CBC - Abnormal; Notable for the following components:   WBC 12.0 (*)    All other components within normal limits  TROPONIN I  LIPASE, BLOOD  HEPATIC FUNCTION PANEL  URINALYSIS, COMPLETE (UACMP) WITH MICROSCOPIC   ____________________________________________  EKG  ED ECG REPORT I, Doran Stabler, the attending physician, personally viewed and interpreted this ECG.   Date: 03/03/2018  EKG Time: 1218  Rate: 77  Rhythm: normal sinus rhythm  Axis: Normal  Intervals:none  ST&T Change: No ST segment elevation or depression.  No abnormal T wave inversion.  ____________________________________________  RADIOLOGY  Chest x-ray without acute process.  Ultrasound of the right upper quadrant which is an equivocal study.  Sonographic Murphy's sign is positive with a large sludge ball/gallstone.  However, there is no gallbladder distention or pericholecystic  fluid. ____________________________________________   PROCEDURES  Procedure(s) performed:   Procedures  Critical Care performed:   ____________________________________________   INITIAL IMPRESSION / ASSESSMENT AND PLAN / ED COURSE  Pertinent labs & imaging results that were available during my care of the patient were reviewed by me and considered in my medical decision making (see chart for details).  Differential diagnosis includes, but is not limited to, biliary disease (biliary colic, acute cholecystitis, cholangitis, choledocholithiasis, etc), intrathoracic causes for epigastric abdominal pain including ACS, gastritis, duodenitis, pancreatitis, small bowel or large bowel obstruction, abdominal aortic aneurysm, hernia, and ulcer(s). Differential diagnosis includes, but is not limited to, ACS, aortic dissection, pulmonary embolism, cardiac tamponade, pneumothorax, pneumonia, pericarditis, myocarditis, GI-related causes including esophagitis/gastritis, and musculoskeletal chest wall pain.   As part of my medical decision making, I reviewed the following data within the electronic MEDICAL RECORD NUMBER Notes from prior ED visits  ----------------------------------------- 3:20 PM on 03/03/2018 -----------------------------------------  Patient at this time with continued  pain as well as nausea and vomiting.  Given an additional dose of morphine and Zofran.  Normal lipase as well as bilirubin.  Will be admitted to the hospital under the surgical service.  Discussed case with Dr. Satira Sark.  Patient understanding the diagnosis well treatment plan and willing to comply. ____________________________________________   FINAL CLINICAL IMPRESSION(S) / ED DIAGNOSES  Acute cholecystitis.  NEW MEDICATIONS STARTED DURING THIS VISIT:  New Prescriptions   No medications on file     Note:  This document was prepared using Dragon voice recognition software and may include unintentional dictation  errors.     Orbie Pyo, MD 03/03/18 1520

## 2018-03-03 NOTE — H&P (Signed)
Patient ID: Vanessa Casey, female   DOB: June 26, 1950, 67 y.o.   MRN: [id number]  HPI Vanessa Casey is a 67 y.o. female with 8 hrs hx RUQ pain, sharp constant , radiated to epigastric area  And chest no specific alleviating or aggravating factors.  Nausea and vomiting. No jaundice, no fevers or chills no bil obstruction. U/S pers. reviewed + murphy nml CBD and GS. WBC 12 lft nml. EKG no ischemic changes. pers reviewed  She is able to perform more than 4 METS w/o SOB or C/P. Daughter w GB issues did well cholecystectomy.   HPI  Past Medical History:  Diagnosis Date  . Edema    FEET/ LEGS  . Medical history non-contributory   . Post-operative nausea and vomiting 04/30/2017  . Shingles     Past Surgical History:  Procedure Laterality Date  . ABDOMINAL HYSTERECTOMY    . BASAL CELL CARCINOMA EXCISION    . CATARACT EXTRACTION W/PHACO Right 05/05/2017   Procedure: CATARACT EXTRACTION PHACO AND INTRAOCULAR LENS PLACEMENT (IOC);  Surgeon: Birder Robson, MD;  Location: [facility] ORS;  Service: Ophthalmology;  Laterality: Right;  [place] 00:33.2 AP% 15.8 CDE 5.25 Fluid Pack Lot # [id number]  . CATARACT EXTRACTION W/PHACO Left 06/09/2017   Procedure: CATARACT EXTRACTION PHACO AND INTRAOCULAR LENS PLACEMENT ([place]);  Surgeon: Birder Robson, MD;  Location: [facility] ORS;  Service: Ophthalmology;  Laterality: Left;  [place] 01:02.2 AP% 15.2 CDE 9.48 Fluid Pack Lot # [id number]    Family History  Problem Relation Age of Onset  . Dementia Mother   . Heart failure Father   . Heart attack Sister   . Heart attack Brother   . Breast cancer Maternal Aunt     Social History Social History   Tobacco Use  . Smoking status: Never Smoker  . Smokeless tobacco: Never Used  Substance Use Topics  . Alcohol use: No  . Drug use: No    No Known Allergies  Current Facility-Administered Medications  Medication Dose Route Frequency Provider Last Rate Last Dose  . acetaminophen (TYLENOL) tablet 1,000 mg  1,000 mg Oral  Q6H Berlyn Saylor F, MD      . cefTRIAXone (ROCEPHIN) 2 g in sodium chloride 0.9 % 100 mL IVPB  2 g Intravenous q1800 Sudais Banghart F, MD      . dextrose 5 % and 0.9 % NaCl with KCl 20 mEq/L infusion   Intravenous Continuous Earnie Bechard F, MD      . diphenhydrAMINE (BENADRYL) 12.5 MG/5ML elixir 12.5 mg  12.5 mg Oral Q6H PRN Twylla Arceneaux F, MD       Or  . diphenhydrAMINE (BENADRYL) injection 12.5 mg  12.5 mg Intravenous Q6H PRN Seydina Holliman F, MD      . heparin injection 5,000 Units  5,000 Units Subcutaneous Q8H Laqueshia Cihlar F, MD      . hydrALAZINE (APRESOLINE) injection 10 mg  10 mg Intravenous Q2H PRN Mareta Chesnut F, MD      . ketorolac (TORADOL) 15 MG/ML injection 15 mg  15 mg Intravenous Q6H PRN Geronimo Diliberto F, MD      . morphine 2 MG/ML injection 2 mg  2 mg Intravenous Q2H PRN Zuleyka Kloc F, MD      . ondansetron (ZOFRAN-ODT) disintegrating tablet 4 mg  4 mg Oral Q6H PRN Harla Mensch F, MD       Or  . ondansetron (ZOFRAN) injection 4 mg  4 mg Intravenous Q6H PRN Johny Pitstick, Marjory Lies, MD      .  pantoprazole (PROTONIX) injection 40 mg  40 mg Intravenous QHS Vern Prestia F, MD      . piperacillin-tazobactam (ZOSYN) IVPB 3.375 g  3.375 g Intravenous Q8H Cris Talavera F, MD      . prochlorperazine (COMPAZINE) tablet 10 mg  10 mg Oral Q6H PRN Mauricio Dahlen F, MD       Or  . prochlorperazine (COMPAZINE) injection 5-10 mg  5-10 mg Intravenous Q6H PRN Darryon Bastin F, MD      . promethazine (PHENERGAN) 12.5 mg in sodium chloride 0.9 % 1,000 mL infusion  12.5 mg Intravenous Once Denean Pavon F, MD      . sodium chloride 0.9 % bolus 1,000 mL  1,000 mL Intravenous Once Orbie Pyo, MD 983.6 mL/hr at 03/03/18 1447 1,000 mL at 03/03/18 1447   Current Outpatient Medications  Medication Sig Dispense Refill  . ibuprofen (ADVIL,MOTRIN) 200 MG tablet Take 400 mg by mouth every 6 (six) hours as needed for headache or moderate pain.    . predniSONE (DELTASONE) 50 MG tablet Take 1 tablet (50 mg total) by  mouth daily with breakfast. 5 tablet 0     Review of Systems Full ROS  was asked and was negative except for the information on the HPI  Physical Exam Blood pressure (!) 159/59, pulse (!) 101, temperature 98.4 F (36.9 C), temperature source Oral, resp. rate 16, weight 76.2 kg, SpO2 100 %. CONSTITUTIONAL: NAD. EYES: Pupils are equal, round, and reactive to light, Sclera are non-icteric. EARS, NOSE, MOUTH AND THROAT: The oropharynx is clear. The oral mucosa is pink and moist. Hearing is intact to voice. LYMPH NODES:  Lymph nodes in the neck are normal. RESPIRATORY:  Lungs are clear. There is normal respiratory effort, with equal breath sounds bilaterally, and without pathologic use of accessory muscles. CARDIOVASCULAR: Heart is regular without murmurs, gallops, or rubs. GI: The abdomen is  soft, TTP RUQ w + murphy sign. No peritonitis.GU: Rectal deferred.   MUSCULOSKELETAL: Normal muscle strength and tone. No cyanosis or edema.   SKIN: Turgor is good and there are no pathologic skin lesions or ulcers. NEUROLOGIC: Motor and sensation is grossly normal. Cranial nerves are grossly intact. PSYCH:  Oriented to person, place and time. Affect is normal.  Data Reviewed  I have personally reviewed the patient's imaging, laboratory findings and medical records.    Assessment/Plan Acute cholecystitis. We will admit, IVF, A/Bs and schedule her for lap chole tomorrow.The risks, benefits, complications, treatment options, and expected outcomes were discussed with the patient. The possibilities of bleeding, recurrent infection, finding a normal gallbladder, perforation of viscus organs, damage to surrounding structures, bile leak, abscess formation, needing a drain placed, the need for additional procedures, reaction to medication, pulmonary aspiration,  failure to diagnose a condition, the possible need to convert to an open procedure, and creating a complication requiring transfusion or operation were  discussed with the patient. The patient and/or family concurred with the proposed plan, giving informed consent.   Caroleen Hamman, MD FACS General Surgeon 03/03/2018, 3:42 PM

## 2018-03-04 ENCOUNTER — Inpatient Hospital Stay: Payer: PPO | Admitting: Certified Registered"

## 2018-03-04 ENCOUNTER — Encounter
Admission: EM | Disposition: A | Discharge status: Home / Self Care | Payer: Self-pay | Source: Home / Self Care | Attending: Surgery

## 2018-03-04 DIAGNOSIS — K819 Cholecystitis, unspecified: Secondary | ICD-10-CM | POA: Diagnosis present

## 2018-03-04 DIAGNOSIS — Z85828 Personal history of other malignant neoplasm of skin: Secondary | ICD-10-CM | POA: Diagnosis not present

## 2018-03-04 DIAGNOSIS — K810 Acute cholecystitis: Secondary | ICD-10-CM | POA: Diagnosis present

## 2018-03-04 DIAGNOSIS — Z7952 Long term (current) use of systemic steroids: Secondary | ICD-10-CM | POA: Diagnosis not present

## 2018-03-04 DIAGNOSIS — K82A1 Gangrene of gallbladder in cholecystitis: Secondary | ICD-10-CM | POA: Diagnosis present

## 2018-03-04 DIAGNOSIS — Z5331 Laparoscopic surgical procedure converted to open procedure: Secondary | ICD-10-CM | POA: Diagnosis not present

## 2018-03-04 HISTORY — DX: Acute cholecystitis: K81.0

## 2018-03-04 HISTORY — PX: CHOLECYSTECTOMY: SHX55

## 2018-03-04 LAB — CBC
HCT: 35 % — ABNORMAL LOW (ref 36.0–46.0)
Hemoglobin: 11.1 g/dL — ABNORMAL LOW (ref 12.0–15.0)
MCH: 27.1 pg (ref 26.0–34.0)
MCHC: 31.7 g/dL (ref 30.0–36.0)
MCV: 85.6 fL (ref 80.0–100.0)
Platelets: 202 10*3/uL (ref 150–400)
RBC: 4.09 MIL/uL (ref 3.87–5.11)
RDW: 13.9 % (ref 11.5–15.5)
WBC: 10.8 10*3/uL — ABNORMAL HIGH (ref 4.0–10.5)
nRBC: 0 % (ref 0.0–0.2)

## 2018-03-04 LAB — COMPREHENSIVE METABOLIC PANEL
ALT: 15 U/L (ref 0–44)
AST: 21 U/L (ref 15–41)
Albumin: 3.1 g/dL — ABNORMAL LOW (ref 3.5–5.0)
Alkaline Phosphatase: 52 U/L (ref 38–126)
Anion gap: 8 (ref 5–15)
BILIRUBIN TOTAL: 0.5 mg/dL (ref 0.3–1.2)
BUN: 13 mg/dL (ref 8–23)
CO2: 24 mmol/L (ref 22–32)
Calcium: 7.8 mg/dL — ABNORMAL LOW (ref 8.9–10.3)
Chloride: 108 mmol/L (ref 98–111)
Creatinine, Ser: 0.69 mg/dL (ref 0.44–1.00)
GFR calc Af Amer: >60 mL/min (ref >60)
Glucose, Bld: 135 mg/dL — ABNORMAL HIGH (ref 70–99)
Potassium: 3.6 mmol/L (ref 3.5–5.1)
Sodium: 140 mmol/L (ref 135–145)
Total Protein: 6.5 g/dL (ref 6.5–8.1)

## 2018-03-04 LAB — PROTIME-INR
INR: 1.1
Prothrombin Time: 14.1 seconds (ref 11.4–15.2)

## 2018-03-04 SURGERY — LAPAROSCOPIC CHOLECYSTECTOMY
Anesthesia: General | Site: Abdomen

## 2018-03-04 MED ORDER — ROCURONIUM BROMIDE 100 MG/10ML IV SOLN
INTRAVENOUS | Status: DC | PRN
  Administered 2018-03-04 (×2): 10 mg via INTRAVENOUS
  Administered 2018-03-04: 40 mg via INTRAVENOUS

## 2018-03-04 MED ORDER — DEXAMETHASONE SODIUM PHOSPHATE 10 MG/ML IJ SOLN
INTRAMUSCULAR | Status: DC | PRN
  Administered 2018-03-04: 5 mg via INTRAVENOUS

## 2018-03-04 MED ORDER — SODIUM CHLORIDE (PF) 0.9 % IJ SOLN
INTRAMUSCULAR | Status: AC
  Filled 2018-03-04: qty 50

## 2018-03-04 MED ORDER — MIDAZOLAM HCL 2 MG/2ML IJ SOLN
INTRAMUSCULAR | Status: DC | PRN
  Administered 2018-03-04: 2 mg via INTRAVENOUS

## 2018-03-04 MED ORDER — LACTATED RINGERS IV SOLN
INTRAVENOUS | Status: DC | PRN
  Administered 2018-03-04: 14:00:00 via INTRAVENOUS

## 2018-03-04 MED ORDER — BUPIVACAINE-EPINEPHRINE 0.25% -1:200000 IJ SOLN
INTRAMUSCULAR | Status: DC | PRN
  Administered 2018-03-04: 30 mL

## 2018-03-04 MED ORDER — SODIUM CHLORIDE 0.9 % IV SOLN
INTRAVENOUS | Status: DC | PRN
  Administered 2018-03-04: 250 mL via INTRAVENOUS

## 2018-03-04 MED ORDER — FENTANYL CITRATE (PF) 100 MCG/2ML IJ SOLN
INTRAMUSCULAR | Status: AC
  Filled 2018-03-04: qty 2

## 2018-03-04 MED ORDER — LIDOCAINE HCL (CARDIAC) PF 100 MG/5ML IV SOSY
PREFILLED_SYRINGE | INTRAVENOUS | Status: DC | PRN
  Administered 2018-03-04: 100 mg via INTRAVENOUS

## 2018-03-04 MED ORDER — SUGAMMADEX SODIUM 200 MG/2ML IV SOLN
INTRAVENOUS | Status: DC | PRN
  Administered 2018-03-04: 200 mg via INTRAVENOUS

## 2018-03-04 MED ORDER — ONDANSETRON HCL 4 MG/2ML IJ SOLN: 4.0000 mg | Freq: Once | INTRAMUSCULAR | Status: DC | PRN

## 2018-03-04 MED ORDER — FENTANYL CITRATE (PF) 100 MCG/2ML IJ SOLN
25.0000 ug | INTRAMUSCULAR | Status: DC | PRN
  Administered 2018-03-04 (×3): 25 ug via INTRAVENOUS

## 2018-03-04 MED ORDER — ONDANSETRON HCL 4 MG/2ML IJ SOLN
INTRAMUSCULAR | Status: DC | PRN
  Administered 2018-03-04: 4 mg via INTRAVENOUS

## 2018-03-04 MED ORDER — PROPOFOL 10 MG/ML IV BOLUS
INTRAVENOUS | Status: DC | PRN
  Administered 2018-03-04: 120 mg via INTRAVENOUS

## 2018-03-04 MED ORDER — FENTANYL CITRATE (PF) 100 MCG/2ML IJ SOLN
INTRAMUSCULAR | Status: AC
  Administered 2018-03-04: 25 ug via INTRAVENOUS
  Filled 2018-03-04: qty 2

## 2018-03-04 MED ORDER — BUPIVACAINE LIPOSOME 1.3 % IJ SUSP
INTRAMUSCULAR | Status: AC
  Filled 2018-03-04: qty 20

## 2018-03-04 MED ORDER — PROPOFOL 500 MG/50ML IV EMUL
INTRAVENOUS | Status: AC
  Filled 2018-03-04: qty 50

## 2018-03-04 MED ORDER — PHENYLEPHRINE HCL 10 MG/ML IJ SOLN
INTRAMUSCULAR | Status: DC | PRN
  Administered 2018-03-04 (×5): 80 ug via INTRAVENOUS

## 2018-03-04 MED ORDER — FENTANYL CITRATE (PF) 100 MCG/2ML IJ SOLN
INTRAMUSCULAR | Status: DC | PRN
  Administered 2018-03-04: 100 ug via INTRAVENOUS

## 2018-03-04 MED ORDER — PROPOFOL 10 MG/ML IV BOLUS
INTRAVENOUS | Status: AC
  Filled 2018-03-04: qty 40

## 2018-03-04 MED ORDER — MIDAZOLAM HCL 2 MG/2ML IJ SOLN
INTRAMUSCULAR | Status: AC
  Filled 2018-03-04: qty 2

## 2018-03-04 MED ORDER — BUPIVACAINE-EPINEPHRINE (PF) 0.25% -1:200000 IJ SOLN
INTRAMUSCULAR | Status: AC
  Filled 2018-03-04: qty 30

## 2018-03-04 MED ORDER — OXYCODONE HCL 5 MG PO TABS
5.0000 mg | ORAL_TABLET | ORAL | Status: DC | PRN
  Administered 2018-03-04 – 2018-03-05 (×4): 5 mg via ORAL
  Filled 2018-03-04 (×4): qty 1

## 2018-03-04 MED ORDER — PROPOFOL 500 MG/50ML IV EMUL
INTRAVENOUS | Status: DC | PRN
  Administered 2018-03-04: 20 ug/kg/min via INTRAVENOUS

## 2018-03-04 MED ORDER — BUPIVACAINE LIPOSOME 1.3 % IJ SUSP
INTRAMUSCULAR | Status: DC | PRN
  Administered 2018-03-04: 20 mL

## 2018-03-04 SURGICAL SUPPLY — 63 items
APPLICATOR COTTON TIP 6 STRL (MISCELLANEOUS) ×1 IMPLANT
APPLICATOR COTTON TIP 6IN STRL (MISCELLANEOUS) ×2
APPLIER CLIP 5 13 M/L LIGAMAX5 (MISCELLANEOUS) ×2
BLADE SURG 15 STRL LF DISP TIS (BLADE) ×1 IMPLANT
BLADE SURG 15 STRL SS (BLADE) ×1
BULB RESERV EVAC DRAIN JP 100C (MISCELLANEOUS) ×2 IMPLANT
CANISTER SUCT 1200ML W/VALVE (MISCELLANEOUS) ×2 IMPLANT
CHLORAPREP W/TINT 26ML (MISCELLANEOUS) ×2 IMPLANT
CHOLANGIOGRAM CATH TAUT (CATHETERS) IMPLANT
CLEANER CAUTERY TIP 5X5 PAD (MISCELLANEOUS) ×1 IMPLANT
CLIP APPLIE 5 13 M/L LIGAMAX5 (MISCELLANEOUS) ×1 IMPLANT
COVER WAND RF STERILE (DRAPES) ×2 IMPLANT
DECANTER SPIKE VIAL GLASS SM (MISCELLANEOUS) ×4 IMPLANT
DERMABOND ADVANCED (GAUZE/BANDAGES/DRESSINGS) ×1
DERMABOND ADVANCED .7 DNX12 (GAUZE/BANDAGES/DRESSINGS) ×1 IMPLANT
DRAIN CHANNEL JP 19F (MISCELLANEOUS) ×2 IMPLANT
DRAPE C-ARM XRAY 36X54 (DRAPES) ×2 IMPLANT
DRAPE INCISE IOBAN 66X45 STRL (DRAPES) ×2 IMPLANT
DRSG OPSITE POSTOP 4X6 (GAUZE/BANDAGES/DRESSINGS) ×2 IMPLANT
DRSG TEGADERM 2-3/8X2-3/4 SM (GAUZE/BANDAGES/DRESSINGS) ×4 IMPLANT
DRSG TELFA 4X3 1S NADH ST (GAUZE/BANDAGES/DRESSINGS) ×2 IMPLANT
ELECT CAUTERY BLADE 6.4 (BLADE) ×2 IMPLANT
ELECT REM PT RETURN 9FT ADLT (ELECTROSURGICAL) ×2
ELECTRODE REM PT RTRN 9FT ADLT (ELECTROSURGICAL) ×1 IMPLANT
ENDOLOOP SUT PDS II  0 18 (SUTURE) ×1
ENDOLOOP SUT PDS II 0 18 (SUTURE) ×1 IMPLANT
GLOVE BIO SURGEON STRL SZ7 (GLOVE) ×2 IMPLANT
GOWN STRL REUS W/ TWL LRG LVL3 (GOWN DISPOSABLE) ×3 IMPLANT
GOWN STRL REUS W/TWL LRG LVL3 (GOWN DISPOSABLE) ×3
IRRIGATION STRYKERFLOW (MISCELLANEOUS) ×1 IMPLANT
IRRIGATOR STRYKERFLOW (MISCELLANEOUS) ×2
IV CATH ANGIO 12GX3 LT BLUE (NEEDLE) ×2 IMPLANT
IV NS 1000ML (IV SOLUTION) ×1
IV NS 1000ML BAXH (IV SOLUTION) ×1 IMPLANT
L-HOOK LAP DISP 36CM (ELECTROSURGICAL) ×2
LHOOK LAP DISP 36CM (ELECTROSURGICAL) ×1 IMPLANT
NEEDLE HYPO 22GX1.5 SAFETY (NEEDLE) ×4 IMPLANT
NS IRRIG 1000ML POUR BTL (IV SOLUTION) ×2 IMPLANT
PACK LAP CHOLECYSTECTOMY (MISCELLANEOUS) ×2 IMPLANT
PAD CLEANER CAUTERY TIP 5X5 (MISCELLANEOUS) ×1
PENCIL ELECTRO HAND CTR (MISCELLANEOUS) ×2 IMPLANT
POUCH SPECIMEN RETRIEVAL 10MM (ENDOMECHANICALS) ×2 IMPLANT
SCISSORS METZENBAUM CVD 33 (INSTRUMENTS) ×2 IMPLANT
SLEEVE ENDOPATH XCEL 5M (ENDOMECHANICALS) ×4 IMPLANT
SOL ANTI-FOG 6CC FOG-OUT (MISCELLANEOUS) ×1 IMPLANT
SOL FOG-OUT ANTI-FOG 6CC (MISCELLANEOUS) ×1
SPONGE DRAIN TRACH 4X4 STRL 2S (GAUZE/BANDAGES/DRESSINGS) ×2 IMPLANT
SPONGE LAP 18X18 RF (DISPOSABLE) ×2 IMPLANT
STAPLER SKIN PROX 35W (STAPLE) ×2 IMPLANT
STOPCOCK 4 WAY LG BORE MALE ST (IV SETS) IMPLANT
SUT ETHIBOND 0 MO6 C/R (SUTURE) IMPLANT
SUT ETHILON 3-0 FS-10 30 BLK (SUTURE) ×2
SUT MNCRL AB 4-0 PS2 18 (SUTURE) ×2 IMPLANT
SUT PDS AB 0 CT1 27 (SUTURE) ×2 IMPLANT
SUT PDS AB 1 TP1 96 (SUTURE) ×2 IMPLANT
SUT VICRYL 0 AB UR-6 (SUTURE) ×4 IMPLANT
SUTURE EHLN 3-0 FS-10 30 BLK (SUTURE) ×1 IMPLANT
SYR 20CC LL (SYRINGE) ×2 IMPLANT
SYS LAPSCP GELPORT 120MM (MISCELLANEOUS) ×2
SYSTEM LAPSCP GELPORT 120MM (MISCELLANEOUS) ×1 IMPLANT
TROCAR XCEL BLUNT TIP 100MML (ENDOMECHANICALS) ×2 IMPLANT
TROCAR XCEL NON-BLD 5MMX100MML (ENDOMECHANICALS) ×2 IMPLANT
TUBING INSUFFLATION (TUBING) ×2 IMPLANT

## 2018-03-04 NOTE — Anesthesia Post-op Follow-up Note (Signed)
Anesthesia QCDR form completed.        

## 2018-03-04 NOTE — Op Note (Signed)
Hand assisted Laparoscopic Cholecystectomy  Pre-operative Diagnosis: Cholecystitis  Post-operative Diagnosis: same  Procedure:  1. Attempted laparoscopic cholecystectomy 2.Hand Assisted laparoscopic cholecystectomy   Surgeon: Caroleen Hamman, MD FACS  Anesthesia: Gen. with endotracheal tube   Findings: Gangrenous  Cholecystitis with severe inflammatory response   Estimated Blood Loss: 150cc         Drains: 19 FR Blake RUQ         Specimens: Gallbladder           Complications: none   Procedure Details  The patient was seen again in the Holding Room. The benefits, complications, treatment options, and expected outcomes were discussed with the patient. The risks of bleeding, infection, recurrence of symptoms, failure to resolve symptoms, bile duct damage, bile duct leak, retained common bile duct stone, bowel injury, any of which could require further surgery and/or ERCP, stent, or papillotomy were reviewed with the patient. The likelihood of improving the patient's symptoms with return to their baseline status is good.  The patient and/or family concurred with the proposed plan, giving informed consent.  The patient was taken to Operating Room, identified as Samyrah Bruster and the procedure verified as Laparoscopic Cholecystectomy.  A Time Out was held and the above information confirmed.  Prior to the induction of general anesthesia, antibiotic prophylaxis was administered. VTE prophylaxis was in place. General endotracheal anesthesia was then administered and tolerated well. After the induction, the abdomen was prepped with Chloraprep and draped in the sterile fashion. The patient was positioned in the supine position.  Cut down technique was used to enter the abdominal cavity and a Hasson trochar was placed after two vicryl stitches were anchored to the fascia. Pneumoperitoneum was then created with CO2 and tolerated well without any adverse changes in the patient's vital signs.  Three  5-mm ports were placed in the right upper quadrant all under direct vision. All skin incisions  were infiltrated with a local anesthetic agent before making the incision and placing the trocars.   The patient was positioned  in reverse Trendelenburg, tilted slightly to the patient's left.  The gallbladder was identified,, severe inflammatory response and omentum attach and plastered to the GB. It was impossible to perform the case purely laparoscopic, the GB  Was hard and plastered. I converted to hand assisted and place a gelport.  I was able to finger fracture adhesions. I had to do a dome down approach due to inflammation. The Gb was intrahepatic but with careful dissection we were able to dissect it free from the liver.    Adhesions were lysed bluntly. The infundibulum was grasped and retracted laterally, exposing the peritoneum overlying the triangle of Calot. This was then divided and exposed in a blunt fashion. An extended critical view of the cystic duct and cystic artery was obtained.  The cystic duct was clearly identified and bluntly dissected.   Artery and duct were double clipped and divided. I reinforced the Cystic duct with a PDS endoloop due to the inflammatory response The gallbladder was removed and placed in an Endocatch bag. The liver bed was irrigated and inspected. Hemostasis was achieved with the electrocautery. Copious irrigation was utilized and was repeatedly aspirated until clear.  The gallbladder and Endocatch sac were then removed through a port site.  # 19 Fr blake drain was placed Inspection of the right upper quadrant was performed. No bleeding, bile duct injury or leak, or bowel injury was noted. Pneumoperitoneum was released.  The fascia was closed w running 0  PDS sutures.Liposomal marcain was used and staples were utilized to close the skin incisions. The patient was then extubated and brought to the recovery room in stable condition. Sponge, lap, and needle counts were  correct at closure and at the conclusion of the case.               Caroleen Hamman, MD, FACS

## 2018-03-04 NOTE — Transfer of Care (Signed)
Immediate Anesthesia Transfer of Care Note  Patient: Vanessa Casey  Procedure(s) Performed: LAPAROSCOPIC CHOLECYSTECTOMY (N/A Abdomen)  Patient Location: PACU  Anesthesia Type:General  Level of Consciousness: sedated  Airway & Oxygen Therapy: Patient Spontanous Breathing and Patient connected to face mask oxygen  Post-op Assessment: Report given to RN and Post -op Vital signs reviewed and stable  Post vital signs: Reviewed and stable  Last Vitals:  Vitals Value Taken Time  BP 149/55 03/04/2018  3:38 PM  Temp 36.4 C 03/04/2018  3:38 PM  Pulse 101 03/04/2018  3:38 PM  Resp 15 03/04/2018  3:38 PM  SpO2 99 % 03/04/2018  3:38 PM  Vitals shown include unvalidated device data.  Last Pain:  Vitals:   03/04/18 1538  TempSrc: Temporal  PainSc: 0-No pain         Complications: No apparent anesthesia complications

## 2018-03-04 NOTE — Anesthesia Preprocedure Evaluation (Signed)
Anesthesia Evaluation  Patient identified by MRN, date of birth, ID band Patient awake    Reviewed: Allergy & Precautions, NPO status , Patient's Chart, lab work & pertinent test results  History of Anesthesia Complications (+) PONV and history of anesthetic complications  Airway Mallampati: III       Dental  (+) Lower Dentures   Pulmonary neg sleep apnea, neg COPD,           Cardiovascular (-) hypertension(-) Past MI and (-) CHF (-) dysrhythmias (-) Valvular Problems/Murmurs     Neuro/Psych neg Seizures    GI/Hepatic Neg liver ROS, neg GERD  ,  Endo/Other  neg diabetes  Renal/GU negative Renal ROS     Musculoskeletal   Abdominal   Peds  Hematology   Anesthesia Other Findings   Reproductive/Obstetrics                             Anesthesia Physical Anesthesia Plan  ASA: I  Anesthesia Plan: General   Post-op Pain Management:    Induction:   PONV Risk Score and Plan: 4 or greater and Dexamethasone, Ondansetron, Midazolam and Treatment may vary due to age or medical condition  Airway Management Planned: Oral ETT  Additional Equipment:   Intra-op Plan:   Post-operative Plan:   Informed Consent: I have reviewed the patients History and Physical, chart, labs and discussed the procedure including the risks, benefits and alternatives for the proposed anesthesia with the patient or authorized representative who has indicated his/her understanding and acceptance.     Plan Discussed with:   Anesthesia Plan Comments:         Anesthesia Quick Evaluation

## 2018-03-04 NOTE — Progress Notes (Signed)
The risks, benefits, complications, treatment options, and expected outcomes were discussed with the patient. The possibilities of bleeding, recurrent infection, finding a normal gallbladder, perforation of viscus organs, damage to surrounding structures, bile leak, abscess formation, needing a drain placed, the need for additional procedures, reaction to medication, pulmonary aspiration,  failure to diagnose a condition, the possible need to convert to an open procedure, and creating a complication requiring transfusion or operation were discussed with the patient. The patient and/or family concurred with the proposed plan, giving informed consent.

## 2018-03-04 NOTE — Anesthesia Procedure Notes (Signed)
Procedure Name: Intubation Date/Time: 03/04/2018 1:42 PM Performed by: Hedda Slade, CRNA Pre-anesthesia Checklist: Patient identified, Patient being monitored, Timeout performed, Emergency Drugs available and Suction available Patient Re-evaluated:Patient Re-evaluated prior to induction Oxygen Delivery Method: Circle system utilized Preoxygenation: Pre-oxygenation with 100% oxygen Induction Type: IV induction Ventilation: Mask ventilation without difficulty Laryngoscope Size: 3 and McGraph Grade View: Grade I Tube type: Oral Tube size: 7.0 mm Number of attempts: 1 Airway Equipment and Method: Stylet Placement Confirmation: ETT inserted through vocal cords under direct vision,  positive ETCO2 and breath sounds checked- equal and bilateral Secured at: 21 cm Tube secured with: Tape Dental Injury: Teeth and Oropharynx as per pre-operative assessment

## 2018-03-05 ENCOUNTER — Encounter: Payer: Self-pay | Admitting: Surgery

## 2018-03-05 ENCOUNTER — Other Ambulatory Visit: Payer: Self-pay

## 2018-03-05 LAB — HIV ANTIBODY (ROUTINE TESTING W REFLEX): HIV Screen 4th Generation wRfx: NONREACTIVE

## 2018-03-05 MED ORDER — PANTOPRAZOLE SODIUM 40 MG PO TBEC
40.0000 mg | DELAYED_RELEASE_TABLET | Freq: Every day | ORAL | Status: DC
  Administered 2018-03-05: 40 mg via ORAL
  Filled 2018-03-05: qty 1

## 2018-03-05 NOTE — Progress Notes (Signed)
POD # 1 Doing well Significant pain Taking po AVSS Labs ok  PE NAD Abd: soft, dressing intact. JP serous fluid   A/ p Doing well advance diet Mobilize Keep drain  DC in am

## 2018-03-05 NOTE — Anesthesia Postprocedure Evaluation (Signed)
Anesthesia Post Note  Patient: Vanessa Casey  Procedure(s) Performed: LAPAROSCOPIC CHOLECYSTECTOMY (N/A Abdomen)  Patient location during evaluation: PACU Anesthesia Type: General Level of consciousness: awake and alert and oriented Pain management: pain level controlled Vital Signs Assessment: post-procedure vital signs reviewed and stable Respiratory status: spontaneous breathing Cardiovascular status: blood pressure returned to baseline Anesthetic complications: no     Last Vitals:  Vitals:   03/04/18 1934 03/05/18 0639  BP: (!) 123/48 (!) 117/54  Pulse: 77 69  Resp: 17 16  Temp: 37.1 C 37.4 C  SpO2: 98% 96%    Last Pain:  Vitals:   03/05/18 0639  TempSrc: Oral  PainSc:                  Otilio Groleau

## 2018-03-05 NOTE — Progress Notes (Signed)
Dr pabon was informed that the patient still has not had a BM so I have not been able to rule out c diff.  Order received to increase diet to regular and discontinue isolation

## 2018-03-06 MED ORDER — OXYCODONE HCL 5 MG PO TABS: 5.0000 mg | ORAL_TABLET | ORAL | 0 refills | Status: DC | PRN

## 2018-03-06 MED ORDER — IBUPROFEN 600 MG PO TABS: 600.0000 mg | ORAL_TABLET | Freq: Three times a day (TID) | ORAL | 0 refills | Status: DC | PRN

## 2018-03-06 NOTE — Discharge Instructions (Signed)
Cholecystostomy, Care After This sheet gives you information about how to care for yourself after your procedure. Your health care provider may also give you more specific instructions. If you have problems or questions, contact your health care provider. What can I expect after the procedure? After your procedure, it is common to have soreness near the incision site of your drainage tube (catheter). Follow these instructions at home: Incision care   Follow instructions from your health care provider about how to take care of your incision site where the catheter was inserted. Make sure you: ? Wash your hands with soap and water before and after you change your bandage (dressing). If soap and water are not available, use hand sanitizer. ? Change your dressing as told by your health care provider.  Check the incision site every day for signs of infection. Check for: ? Redness, swelling, or pain. ? Fluid or blood. ? Warmth. ? Pus or a bad smell.  Do not take baths, swim, or use a hot tub until your health care provider approves. Ask your health care provider if you may take showers. You may only be allowed to take sponge baths. General instructions  Follow instructions from your health care provider about how to care for your catheter and collection bag at home.  Your health care provider will show you: ? How to record the amount of drainage from the catheter. ? How to flush the catheter. ? How to care for the catheter incision site.  Follow instructions from your health care provider about eating or drinking restrictions.  Take over-the-counter and prescription medicines only as told by your health care provider.  Keep all follow-up visits as told by your health care provider. This is important. Contact a health care provider if:  You have redness, swelling, or pain around the catheter incision site.  You have nausea or vomiting. Get help right away if:  Your abdominal pain  gets worse.  You feel dizzy or you faint while standing.  You have fluid or blood coming from the catheter incision site.  The area around the catheter incision site feels warm to the touch.  You have pus or a bad smell coming from the catheter incision site.  You have a fever.  You have shortness of breath.  You have a rapid heartbeat.  Your nausea or vomiting does not go away.  Your catheter becomes blocked.  Your catheter comes out of your abdomen. Summary  After your procedure, it is common to have soreness near the incision site of your drainage tube (catheter).  Wash your hands with soap and water before and after you change your bandage (dressing). Change your dressing as told by your health care provider.  Check the catheter incision site every day for signs of infection. Check for redness, swelling, pain, fluid, blood, warmth, pus, or a bad smell.  Contact your health care provider if you have nausea or vomiting, or if you have redness, swelling, or pain around your catheter incision site.  Get help right away if your abdominal pain gets worse, you feel dizzy, you have blood or fluid coming from the catheter incision site, you have a fever, or you have shortness of breath. This information is not intended to replace advice given to you by your health care provider. Make sure you discuss any questions you have with your health care provider. Document Released: 11/15/2014 Document Revised: 09/21/2017 Document Reviewed: 09/21/2017 Elsevier Interactive Patient Education  [employer].  Cholecystitis  Cholecystitis is inflammation of the gallbladder. It is often called a gallbladder attack. The gallbladder is a pear-shaped organ that lies beneath the liver on the right side of the body. The gallbladder stores bile, which is a fluid that helps the body digest fats. If bile builds up in your gallbladder, your gallbladder becomes inflamed. This condition may occur  suddenly. Cholecystitis is a serious condition and requires treatment. What are the causes? The most common cause of this condition is gallstones. Gallstones can block the tube (duct) that carries bile out of your gallbladder. This causes bile to build up. Other causes include:  Damage to the gallbladder due to a decrease in blood flow.  Infections in the bile ducts.  Scars or kinks in the bile ducts.  Tumors in the liver, pancreas, or gallbladder. What increases the risk? You are more likely to develop this condition if:  You have sickle cell disease.  You take birth control pills or use estrogen.  You have alcoholic liver disease.  You have liver cirrhosis.  You have your nutrition delivered through a vein (parenteral nutrition).  You are critically ill.  You do not eat or drink for a long time. This is also called "fasting."  You are obese.  You lose weight too fast.  You are pregnant.  You have high levels of fat (triglycerides) in the blood.  You have pancreatitis. What are the signs or symptoms? Symptoms of this condition include:  Pain in the abdomen, especially in the upper right area of the abdomen.  Tenderness or bloating in the abdomen.  Nausea.  Vomiting.  Fever.  Chills. How is this diagnosed? This condition is diagnosed with a medical history and physical exam. You may also have other tests, including:  Imaging tests, such as: ? An ultrasound of the gallbladder. ? A CT scan of the abdomen. ? A gallbladder nuclear scan (HIDA scan). This scan allows your health care provider to see the bile moving from your liver to your gallbladder and on to your small intestine. ? MRI.  Blood tests, such as: ? A complete blood count. The white blood cell count may be higher than normal. ? Liver function tests. Certain types of gallstones cause some results to be higher than normal. How is this treated? Treatment may include:  Surgery to remove your  gallbladder (cholecystectomy).  Antibiotic medicine, usually through an IV.  Fasting for a certain amount of time.  Giving IV fluids.  Medicine to treat pain or vomiting. Follow these instructions at home:  If you had surgery, follow instructions from your health care provider about home care after the procedure. Medicines   Take over-the-counter and prescription medicines only as told by your health care provider.  If you were prescribed an antibiotic medicine, take it as told by your health care provider. Do not stop taking the antibiotic even if you start to feel better. General instructions  Follow instructions from your health care provider about what to eat or drink. When you are allowed to eat, avoid eating or drinking anything that triggers your symptoms.  Do not lift anything that is heavier than 10 lb (4.5 kg), or the limit that you are told, until your health care provider says that it is safe.  Do not use any products that contain nicotine or tobacco, such as cigarettes and e-cigarettes. If you need help quitting, ask your health care provider.  Keep all follow-up visits as told by your health care provider. This  is important. Contact a health care provider if:  Your pain is not controlled with medicine.  You have a fever. Get help right away if:  Your pain moves to another part of your abdomen or to your back.  You continue to have symptoms or you develop new symptoms even with treatment. Summary  Cholecystitis is inflammation of the gallbladder.  The most common cause of this condition is gallstones. Gallstones can block the tube (duct) that carries bile out of your gallbladder.  Common symptoms are pain in the abdomen, nausea, vomiting, fever, and chills.  This condition is treated with surgery to remove the gallbladder, medicines, fasting, and IV fluids.  Follow your health care provider's instructions for eating and drinking. Avoid eating anything that  triggers your symptoms. This information is not intended to replace advice given to you by your health care provider. Make sure you discuss any questions you have with your health care provider. Document Released: 02/24/2005 Document Revised: 07/03/2017 Document Reviewed: 07/03/2017 Elsevier Interactive Patient Education  2019 [employer].

## 2018-03-06 NOTE — Progress Notes (Signed)
Discharge teaching given to patient, patient verbalized understanding and had no questions. Patient IV removed. Patient will be transported home by family. All patient belongings gathered prior to leaving.  

## 2018-03-06 NOTE — Discharge Summary (Signed)
Patient ID: Vanessa Casey MRN: [id number] DOB/AGE: 1951/02/21 67 y.o.  Admit date: 03/03/2018 Discharge date: 03/06/2018   Discharge Diagnoses:  Active Problems:   Cholecystitis   Acute cholecystitis   Procedures:  Hand assisted laparoscopic cholecystectomy  Hospital Course:  Patient was admitted on 12/25 with acute cholecystitis and taken to the OR on 12/26.  She needed hand assisted laparoscopic cholecystectomy.  Drain was left in place.  Her diet was slowly advanced and she was tolerating, passing flatus.  Her pain is well controlled.  She's ambulating.  She's ready for discharge.  On exam, she was in no acute distress, with stable vital signs.  Her abdomen was soft, non-distended, appropriately tender to palpation.  Incisions were clean, dry, intact with staples in place.  Her drain was serosanguinous.  Consults:  None  Disposition: Discharge disposition: 01-Home or Self Care       Discharge Instructions    Call MD for:  difficulty breathing, headache or visual disturbances   Complete by:  As directed    Call MD for:  persistant nausea and vomiting   Complete by:  As directed    Call MD for:  redness, tenderness, or signs of infection (pain, swelling, redness, odor or green/yellow discharge around incision site)   Complete by:  As directed    Call MD for:  severe uncontrolled pain   Complete by:  As directed    Call MD for:  temperature >100.4   Complete by:  As directed    Change dressing (specify)   Complete by:  As directed    Dry gauze dressing around drain site, change once daily.   Diet - low sodium heart healthy   Complete by:  As directed    Discharge instructions   Complete by:  As directed    1.  Patient may shower, but do not scrub wounds heavily and dab dry only. 2.  Do not submerge wounds in pool/tub for 1 week. 3.  Do not remove staples 4.  Empty and record drain output daily.  Bring record with you to office appointment.   Driving Restrictions    Complete by:  As directed    Do not drive while taking narcotics for pain control.   Increase activity slowly   Complete by:  As directed    Lifting restrictions   Complete by:  As directed    No heavy lifting or pushing of more than 10-15 lbs for 4 weeks.     Allergies as of 03/06/2018   No Known Allergies     Medication List    STOP taking these medications   predniSONE 50 MG tablet Commonly known as:  DELTASONE     TAKE these medications   ibuprofen 600 MG tablet Commonly known as:  ADVIL,MOTRIN Take 1 tablet (600 mg total) by mouth every 8 (eight) hours as needed for headache or moderate pain. What changed:    medication strength  how much to take  when to take this   oxyCODONE 5 MG immediate release tablet Commonly known as:  Oxy IR/ROXICODONE Take 1 tablet (5 mg total) by mouth every 4 (four) hours as needed for severe pain.            Discharge Care Instructions  (From admission, onward)         Start     Ordered   03/06/18 0000  Change dressing (specify)    Comments:  Dry gauze dressing around drain site, change once daily.  03/06/18 1042         Follow-up Information    Fredirick Maudlin, MD Follow up on 03/11/2018.   Specialty:  General Surgery Why:  Please set up appointment for follow up on 03/11/18, for drain and staple removal. Contact information: [address] [place] [address] [phone]

## 2018-03-08 LAB — SURGICAL PATHOLOGY

## 2018-03-11 ENCOUNTER — Ambulatory Visit (INDEPENDENT_AMBULATORY_CARE_PROVIDER_SITE_OTHER): Payer: PPO | Admitting: General Surgery

## 2018-03-11 ENCOUNTER — Encounter: Payer: Self-pay | Admitting: General Surgery

## 2018-03-11 ENCOUNTER — Other Ambulatory Visit: Payer: Self-pay

## 2018-03-11 VITALS — BP 135/63 | HR 67 | Temp 98.0°F | Resp 18 | Ht 61.0 in | Wt 166.0 lb

## 2018-03-11 DIAGNOSIS — Z9049 Acquired absence of other specified parts of digestive tract: Secondary | ICD-10-CM

## 2018-03-11 NOTE — Patient Instructions (Signed)
We removed your drain today. Please keep a dressing over the area until it heals completely.  We have placed steri strips over the incision area. These will fall off 7-10 days. Please pat them dry after showering.   Please see your appointment listed below.

## 2018-03-11 NOTE — Progress Notes (Signed)
Pt s/p hand-assisted lap chole w/Dr. Dahlia Byes.  Here for drain and staple removal.  Drain has put out about 10 cc serosanguinous fluid over each of the past 3 days.    Drain and staples removed without complications.  Follow up with Dr. Dahlia Byes as scheduled.

## 2018-03-15 ENCOUNTER — Telehealth: Payer: Self-pay | Admitting: *Deleted

## 2018-03-15 ENCOUNTER — Encounter: Payer: Self-pay | Admitting: Surgery

## 2018-03-15 ENCOUNTER — Other Ambulatory Visit: Payer: Self-pay

## 2018-03-15 ENCOUNTER — Ambulatory Visit (INDEPENDENT_AMBULATORY_CARE_PROVIDER_SITE_OTHER): Payer: PPO | Admitting: Surgery

## 2018-03-15 VITALS — BP 125/69 | HR 71 | Temp 97.1°F | Resp 13 | Ht 60.0 in | Wt 161.0 lb

## 2018-03-15 DIAGNOSIS — Z09 Encounter for follow-up examination after completed treatment for conditions other than malignant neoplasm: Secondary | ICD-10-CM

## 2018-03-15 NOTE — Telephone Encounter (Signed)
Message left for patient informing him that he may use over the counter Ibuprofen. Will just need to take 3 of the regular 200mg  tablets as needed for pain every 8 hours.

## 2018-03-15 NOTE — Telephone Encounter (Signed)
Patients wants another refill on ibuprofen

## 2018-03-15 NOTE — Progress Notes (Signed)
S/p Hand assisted lap chole for severe and gangrenous cholecystitis 12/26 Path reviewed w the pt Drain is out No other issues Taking PO, no fevers, no pain  PE NAD Abd: soft, nt, incisions c/d/i, no infection  A/p Doing well No heavy lifting RTC prn

## 2018-03-31 DIAGNOSIS — H26493 Other secondary cataract, bilateral: Secondary | ICD-10-CM | POA: Diagnosis not present

## 2018-03-31 DIAGNOSIS — H02889 Meibomian gland dysfunction of unspecified eye, unspecified eyelid: Secondary | ICD-10-CM | POA: Diagnosis not present

## 2018-04-06 ENCOUNTER — Ambulatory Visit
Admission: RE | Admit: 2018-04-06 | Discharge: 2018-04-06 | Disposition: A | Discharge status: Home / Self Care | Payer: PPO | Source: Ambulatory Visit | Attending: Family Medicine | Admitting: Family Medicine

## 2018-04-06 ENCOUNTER — Encounter: Payer: Self-pay | Admitting: Family Medicine

## 2018-04-06 DIAGNOSIS — Z13820 Encounter for screening for osteoporosis: Secondary | ICD-10-CM | POA: Diagnosis not present

## 2018-04-06 DIAGNOSIS — M85851 Other specified disorders of bone density and structure, right thigh: Secondary | ICD-10-CM | POA: Insufficient documentation

## 2018-04-06 DIAGNOSIS — Z1239 Encounter for other screening for malignant neoplasm of breast: Secondary | ICD-10-CM

## 2018-04-06 DIAGNOSIS — Z780 Asymptomatic menopausal state: Secondary | ICD-10-CM | POA: Diagnosis not present

## 2018-04-06 DIAGNOSIS — Z1231 Encounter for screening mammogram for malignant neoplasm of breast: Secondary | ICD-10-CM | POA: Insufficient documentation

## 2018-05-28 ENCOUNTER — Telehealth: Payer: Self-pay

## 2018-06-04 ENCOUNTER — Telehealth: Payer: Self-pay

## 2018-06-18 ENCOUNTER — Telehealth: Payer: Self-pay

## 2018-06-25 ENCOUNTER — Telehealth: Payer: Self-pay

## 2018-07-02 ENCOUNTER — Telehealth: Payer: Self-pay

## 2018-07-23 ENCOUNTER — Telehealth: Payer: Self-pay

## 2018-07-27 ENCOUNTER — Ambulatory Visit: Payer: Self-pay | Admitting: *Deleted

## 2018-07-27 NOTE — Chronic Care Management (AMB) (Signed)
Chronic Care Management   Note  07/27/2018 Name: [name] MRN: [id number] DOB: 1950/10/23  Vanessa Casey is a 68 y.o. year old female who is a primary care patient of Valerie Roys, DO. I reached out to [name] by phone today in response to a referral sent by Ms. Quita Skye Bacus's health plan.    Ms. Dingwall was given information about Chronic Care Management services today including:  1. CCM service includes personalized support from designated clinical staff supervised by her physician, including individualized plan of care and coordination with other care providers 2. 24/7 contact phone numbers for assistance for urgent and routine care needs. 3. Service will only be billed when office clinical staff spend 20 minutes or more in a month to coordinate care. 4. Only one practitioner may furnish and bill the service in a calendar month. 5. The patient may stop CCM services at any time (effective at the end of the month) by phone call to the office staff. 6. The patient will be responsible for cost sharing (co-pay) of up to 20% of the service fee (after annual deductible is met).  Patient agreed to services and verbal consent obtained.   Follow up plan: Telephone appointment with CCM team member scheduled for: 08/03/2018   [place]  ??[email or web address]   ??[phone]

## 2018-07-29 DIAGNOSIS — D1800 Hemangioma unspecified site: Secondary | ICD-10-CM | POA: Diagnosis not present

## 2018-07-29 DIAGNOSIS — C4401 Basal cell carcinoma of skin of lip: Secondary | ICD-10-CM | POA: Diagnosis not present

## 2018-07-29 DIAGNOSIS — D2372 Other benign neoplasm of skin of left lower limb, including hip: Secondary | ICD-10-CM | POA: Diagnosis not present

## 2018-07-29 DIAGNOSIS — L821 Other seborrheic keratosis: Secondary | ICD-10-CM | POA: Diagnosis not present

## 2018-07-29 DIAGNOSIS — D485 Neoplasm of uncertain behavior of skin: Secondary | ICD-10-CM | POA: Diagnosis not present

## 2018-07-29 DIAGNOSIS — Z85828 Personal history of other malignant neoplasm of skin: Secondary | ICD-10-CM | POA: Diagnosis not present

## 2018-07-29 DIAGNOSIS — D1723 Benign lipomatous neoplasm of skin and subcutaneous tissue of right leg: Secondary | ICD-10-CM | POA: Diagnosis not present

## 2018-07-29 DIAGNOSIS — L820 Inflamed seborrheic keratosis: Secondary | ICD-10-CM | POA: Diagnosis not present

## 2018-08-03 ENCOUNTER — Telehealth: Payer: PPO

## 2018-08-17 ENCOUNTER — Telehealth: Payer: Self-pay

## 2018-08-20 ENCOUNTER — Telehealth: Payer: Self-pay

## 2018-08-26 ENCOUNTER — Telehealth: Payer: Self-pay

## 2018-08-26 ENCOUNTER — Ambulatory Visit: Payer: Self-pay | Admitting: *Deleted

## 2018-08-26 DIAGNOSIS — K819 Cholecystitis, unspecified: Secondary | ICD-10-CM

## 2018-08-26 NOTE — Chronic Care Management (AMB) (Signed)
  Chronic Care Management   Outreach Note  08/26/2018 Name: Vanessa Casey MRN: [id number] DOB: [birth year]  Referred by: Valerie Roys, DO Reason for referral : Chronic Care Management (Intro To CCM unsuccessful call)   An unsuccessful telephone outreach was attempted today. The patient was referred to the case management team by for assistance with chronic care management and care coordination.   Follow Up Plan: A HIPPA compliant phone message was left for the patient providing contact information and requesting a return call.  The care management team will reach out to the patient again over the next 30 days.   Merlene Morse Aries Kasa RN, BSN Nurse Case Editor, [facility]  [phone]) Business Mobile

## 2018-09-22 ENCOUNTER — Telehealth: Payer: Self-pay

## 2018-10-19 ENCOUNTER — Telehealth: Payer: Self-pay

## 2018-11-09 ENCOUNTER — Telehealth: Payer: Self-pay

## 2018-11-22 DIAGNOSIS — C4401 Basal cell carcinoma of skin of lip: Secondary | ICD-10-CM | POA: Diagnosis not present

## 2018-12-03 ENCOUNTER — Other Ambulatory Visit: Payer: Self-pay

## 2018-12-03 ENCOUNTER — Ambulatory Visit (INDEPENDENT_AMBULATORY_CARE_PROVIDER_SITE_OTHER): Payer: PPO

## 2018-12-03 DIAGNOSIS — Z23 Encounter for immunization: Secondary | ICD-10-CM | POA: Diagnosis not present

## 2018-12-15 ENCOUNTER — Telehealth: Payer: Self-pay

## 2019-01-17 ENCOUNTER — Other Ambulatory Visit: Payer: Self-pay

## 2019-01-17 ENCOUNTER — Ambulatory Visit: Payer: PPO

## 2019-01-17 ENCOUNTER — Encounter: Payer: PPO | Admitting: Family Medicine

## 2019-01-17 ENCOUNTER — Ambulatory Visit: Payer: PPO | Admitting: Family Medicine

## 2019-01-26 ENCOUNTER — Telehealth: Payer: Self-pay

## 2019-02-02 DIAGNOSIS — Z85828 Personal history of other malignant neoplasm of skin: Secondary | ICD-10-CM | POA: Diagnosis not present

## 2019-02-02 DIAGNOSIS — B079 Viral wart, unspecified: Secondary | ICD-10-CM | POA: Diagnosis not present

## 2019-02-02 DIAGNOSIS — L910 Hypertrophic scar: Secondary | ICD-10-CM | POA: Diagnosis not present

## 2019-03-16 ENCOUNTER — Ambulatory Visit: Payer: PPO

## 2019-03-16 NOTE — Progress Notes (Deleted)
Subjective:   Vanessa Casey is a 69 y.o. female who presents for Medicare Annual (Subsequent) preventive examination.  This visit is being conducted via phone call  - after an attmept to do on video chat - due to the COVID-19 pandemic. This patient has given me verbal consent via phone to conduct this visit, patient states they are participating from their home address. Some vital signs may be absent or patient reported.   Patient identification: identified by name, DOB, and current address.    Review of Systems:         Objective:     Vitals: There were no vitals taken for this visit.  There is no height or weight on file to calculate BMI.  Advanced Directives 03/03/2018 01/14/2018 06/09/2017 05/05/2017 11/08/2014  Does Patient Have a Medical Advance Directive? No No No No No  Would patient like information on creating a medical advance directive? No - Patient declined Yes (MAU/Ambulatory/Procedural Areas - Information given) No - Patient declined - Yes - Scientist, clinical (histocompatibility and immunogenetics) given    Tobacco Social History   Tobacco Use  Smoking Status Never Smoker  Smokeless Tobacco Never Used     Counseling given: Not Answered   Clinical Intake:                       Past Medical History:  Diagnosis Date  . Edema    FEET/ LEGS  . Medical history non-contributory   . Post-operative nausea and vomiting 04/30/2017  . Shingles    Past Surgical History:  Procedure Laterality Date  . ABDOMINAL HYSTERECTOMY    . BASAL CELL CARCINOMA EXCISION    . CATARACT EXTRACTION W/PHACO Right 05/05/2017   Procedure: CATARACT EXTRACTION PHACO AND INTRAOCULAR LENS PLACEMENT (IOC);  Surgeon: Birder Robson, MD;  Location: [facility] ORS;  Service: Ophthalmology;  Laterality: Right;  [place] 00:33.2 AP% 15.8 CDE 5.25 Fluid Pack Lot # [id number]  . CATARACT EXTRACTION W/PHACO Left 06/09/2017   Procedure: CATARACT EXTRACTION PHACO AND INTRAOCULAR LENS PLACEMENT ([place]);  Surgeon: Birder Robson, MD;   Location: [facility] ORS;  Service: Ophthalmology;  Laterality: Left;  [place] 01:02.2 AP% 15.2 CDE 9.48 Fluid Pack Lot # [id number]  . CHOLECYSTECTOMY N/A 03/04/2018   Procedure: LAPAROSCOPIC CHOLECYSTECTOMY;  Surgeon: Jules Husbands, MD;  Location: [facility] ORS;  Service: General;  Laterality: N/A;   Family History  Problem Relation Age of Onset  . Dementia Mother   . Heart failure Father   . Heart attack Sister   . Heart attack Brother   . Breast cancer Maternal Aunt    Social History   Socioeconomic History  . Marital status: Single    Spouse name: Not on file  . Number of children: Not on file  . Years of education: Not on file  . Highest education level: Not on file  Occupational History  . Not on file  Tobacco Use  . Smoking status: Never Smoker  . Smokeless tobacco: Never Used  Substance and Sexual Activity  . Alcohol use: No  . Drug use: No  . Sexual activity: Yes    Birth control/protection: Post-menopausal  Other Topics Concern  . Not on file  Social History Narrative  . Not on file   Social Determinants of Health   Financial Resource Strain:   . Difficulty of Paying Living Expenses: Not on file  Food Insecurity:   . Worried About Charity fundraiser in the Last Year: Not on file  . Ran Out  of Food in the Last Year: Not on file  Transportation Needs:   . Lack of Transportation (Medical): Not on file  . Lack of Transportation (Non-Medical): Not on file  Physical Activity:   . Days of Exercise per Week: Not on file  . Minutes of Exercise per Session: Not on file  Stress:   . Feeling of Stress : Not on file  Social Connections:   . Frequency of Communication with Friends and Family: Not on file  . Frequency of Social Gatherings with Friends and Family: Not on file  . Attends Religious Services: Not on file  . Active Member of Clubs or Organizations: Not on file  . Attends Archivist Meetings: Not on file  . Marital Status: Not on file    Outpatient  Encounter Medications as of 03/16/2019  Medication Sig  . ibuprofen (ADVIL,MOTRIN) 600 MG tablet Take 1 tablet (600 mg total) by mouth every 8 (eight) hours as needed for headache or moderate pain.   No facility-administered encounter medications on file as of 03/16/2019.    Activities of Daily Living No flowsheet data found.  Patient Care Team: Valerie Roys, DO as PCP - General (Family Medicine) Anell Barr, OD (Optometry)    Assessment:   This is a routine wellness examination for Vanessa Casey.  Exercise Activities and Dietary recommendations    Goals   None     Fall Risk: Fall Risk  03/11/2018 01/14/2018 01/12/2017  Falls in the past year? 0 0 No  Number falls in past yr: - 1 -  Injury with Fall? - 0 -    FALL RISK PREVENTION PERTAINING TO THE HOME:  Any stairs in or around the home? {YES/NO:21197} If so, are there any without handrails? {YES/NO:21197}  Home free of loose throw rugs in walkways, pet beds, electrical cords, etc? {YES/NO:21197} Adequate lighting in your home to reduce risk of falls? {YES/NO:21197}  ASSISTIVE DEVICES UTILIZED TO PREVENT FALLS:  Life alert? {YES/NO:21197} Use of a cane, walker or w/c? {YES/NO:21197} Grab bars in the bathroom? {YES/NO:21197} Shower chair or bench in shower? {YES/NO:21197} Elevated toilet seat or a handicapped toilet? {YES/NO:21197}  DME ORDERS:  DME order needed?  {YES/NO:21197}  TIMED UP AND GO:  Unable to perform    Depression Screen PHQ 2/9 Scores 01/14/2018 01/14/2018 01/12/2017  PHQ - 2 Score 0 0 0  PHQ- 9 Score - 0 -     Cognitive Function     6CIT Screen 01/14/2018 01/12/2017  What Year? 0 points 0 points  What month? 0 points 0 points  What time? 0 points 0 points  Count back from 20 0 points 0 points  Months in reverse 0 points 0 points  Repeat phrase 0 points 2 points  Total Score 0 2    Immunization History  Administered Date(s) Administered  . Fluad Quad(high Dose 65+) 12/03/2018  .  Influenza, High Dose Seasonal PF 12/16/2017  . Influenza,inj,Quad PF,6+ Mos 12/11/2016  . Pneumococcal Conjugate-13 01/12/2017  . Pneumococcal Polysaccharide-23 01/14/2018  . Td 11/08/2014    Qualifies for Shingles Vaccine? Yes  Zostavax completed n/a. Due for Shingrix. Education has been provided regarding the importance of this vaccine. Pt has been advised to call insurance company to determine out of pocket expense. Advised may also receive vaccine at local pharmacy or Health Dept. Verbalized acceptance and understanding.  Tdap: up to date   Flu Vaccine: up to date   Pneumococcal Vaccine: up to date  Screening Tests Health Maintenance  Topic Date Due  . Fecal DNA (Cologuard)  01/14/2020  . MAMMOGRAM  04/06/2020  . TETANUS/TDAP  11/07/2024  . INFLUENZA VACCINE  Completed  . DEXA SCAN  Completed  . Hepatitis C Screening  Completed  . PNA vac Low Risk Adult  Completed    Cancer Screenings:  Colorectal Screening: Completed cologuard 01/13/2017, due 01/14/2020  Mammogram: Completed 04/06/2018, ordered   Bone Density: Completed 04/06/2018  Lung Cancer Screening: (Low Dose CT Chest recommended if Age 63-80 years, 30 pack-year currently smoking OR have quit w/in 15years.) {DOES NOT does:27190::"does not"} qualify.   Lung Cancer Screening Referral: An Epic message has been sent to Burgess Estelle, RN (Oncology Nurse Navigator) regarding the possible need for this exam. Raquel Sarna will review the patient's chart to determine if the patient truly qualifies for the exam. If the patient qualifies, Raquel Sarna will order the Low Dose CT of the chest to facilitate the scheduling of this exam.  Additional Screening:  Hepatitis C Screening: {DOES NOT does:27190::"does not"} qualify; Completed ***  Vision Screening: Recommended annual ophthalmology exams for early detection of glaucoma and other disorders of the eye. Is the patient up to date with their annual eye exam?  {YES/NO:21197} Who is the  provider or what is the name of the office in which the pt attends annual eye exams? *** If pt is not established with a provider, would they like to be referred to a provider to establish care? {YES/NO:21197}. Ophthalmology referral has been placed. Pt aware the office will call re: appt.  Dental Screening: Recommended annual dental exams for proper oral hygiene  Community Resource Referral:  CRR required this visit?  {YES/NO:21197}      Plan:  I have personally reviewed and addressed the Medicare Annual Wellness questionnaire and have noted the following in the patient's chart:  A. Medical and social history B. Use of alcohol, tobacco or illicit drugs  C. Current medications and supplements D. Functional ability and status E.  Nutritional status F.  Physical activity G. Advance directives H. List of other physicians I.  Hospitalizations, surgeries, and ER visits in previous 12 months J.  [place] such as hearing and vision if needed, cognitive and depression L. Referrals and appointments   In addition, I have reviewed and discussed with patient certain preventive protocols, quality metrics, and best practice recommendations. A written personalized care plan for preventive services as well as general preventive health recommendations were provided to patient.  Signed,    Bevelyn Ngo, LPN  [id number] Nurse Health Advisor   Nurse Notes:   Ms. Resurreccion , Thank you for taking time to come for your Medicare Wellness Visit. I appreciate your ongoing commitment to your health goals. Please review the following plan we discussed and let me know if I can assist you in the future.   Screening recommendations/referrals: Colonoscopy: cologuard completed 01/13/2017 Mammogram: Please call [phone] to schedule your mammogram.  Bone Density: up to date Recommended yearly ophthalmology/optometry visit for glaucoma screening and checkup Recommended yearly dental visit for  hygiene and checkup  Vaccinations: Influenza vaccine: up to date Pneumococcal vaccine: up to date Tdap vaccine: up to date Shingles vaccine:  Shingrix eligible    Advanced directives: ***  Conditions/risks identified: ***  Next appointment: follow up in one year for your annual wellness visit    Preventive Care 75 Years and Older, Female Preventive care refers to lifestyle choices and visits with your health care provider that can promote health and wellness. What  does preventive care include?  A yearly physical exam. This is also called an annual well check.  Dental exams once or twice a year.  Routine eye exams. Ask your health care provider how often you should have your eyes checked.  Personal lifestyle choices, including:  Daily care of your teeth and gums.  Regular physical activity.  Eating a healthy diet.  Avoiding tobacco and drug use.  Limiting alcohol use.  Practicing safe sex.  Taking low-dose aspirin every day.  Taking vitamin and mineral supplements as recommended by your health care provider. What happens during an annual well check? The services and screenings done by your health care provider during your annual well check will depend on your age, overall health, lifestyle risk factors, and family history of disease. Counseling  Your health care provider may ask you questions about your:  Alcohol use.  Tobacco use.  Drug use.  Emotional well-being.  Home and relationship well-being.  Sexual activity.  Eating habits.  History of falls.  Memory and ability to understand (cognition).  Work and work Statistician.  Reproductive health. Screening  You may have the following tests or measurements:  Height, weight, and BMI.  Blood pressure.  Lipid and cholesterol levels. These may be checked every 5 years, or more frequently if you are over 27 years old.  Skin check.  Lung cancer screening. You may have this screening every year  starting at age 67 if you have a 30-pack-year history of smoking and currently smoke or have quit within the past 15 years.  Fecal occult blood test (FOBT) of the stool. You may have this test every year starting at age 110.  Flexible sigmoidoscopy or colonoscopy. You may have a sigmoidoscopy every 5 years or a colonoscopy every 10 years starting at age 25.  Hepatitis C blood test.  Hepatitis B blood test.  Sexually transmitted disease (STD) testing.  Diabetes screening. This is done by checking your blood sugar (glucose) after you have not eaten for a while (fasting). You may have this done every 1-3 years.  Bone density scan. This is done to screen for osteoporosis. You may have this done starting at age 66.  Mammogram. This may be done every 1-2 years. Talk to your health care provider about how often you should have regular mammograms. Talk with your health care provider about your test results, treatment options, and if necessary, the need for more tests. Vaccines  Your health care provider may recommend certain vaccines, such as:  Influenza vaccine. This is recommended every year.  Tetanus, diphtheria, and acellular pertussis (Tdap, Td) vaccine. You may need a Td booster every 10 years.  Zoster vaccine. You may need this after age 28.  Pneumococcal 13-valent conjugate (PCV13) vaccine. One dose is recommended after age 15.  Pneumococcal polysaccharide (PPSV23) vaccine. One dose is recommended after age 60. Talk to your health care provider about which screenings and vaccines you need and how often you need them. This information is not intended to replace advice given to you by your health care provider. Make sure you discuss any questions you have with your health care provider. Document Released: 03/23/2015 Document Revised: 11/14/2015 Document Reviewed: 12/26/2014 Elsevier Interactive Patient Education  2017 [place] Prevention in the Home Falls can cause  injuries. They can happen to people of all ages. There are many things you can do to make your home safe and to help prevent falls. What can I do on the outside of my  home?  Regularly fix the edges of walkways and driveways and fix any cracks.  Remove anything that might make you trip as you walk through a door, such as a raised step or threshold.  Trim any bushes or trees on the path to your home.  Use bright outdoor lighting.  Clear any walking paths of anything that might make someone trip, such as rocks or tools.  Regularly check to see if handrails are loose or broken. Make sure that both sides of any steps have handrails.  Any raised decks and porches should have guardrails on the edges.  Have any leaves, snow, or ice cleared regularly.  Use sand or salt on walking paths during winter.  Clean up any spills in your garage right away. This includes oil or grease spills. What can I do in the bathroom?  Use night lights.  Install grab bars by the toilet and in the tub and shower. Do not use towel bars as grab bars.  Use non-skid mats or decals in the tub or shower.  If you need to sit down in the shower, use a plastic, non-slip stool.  Keep the floor dry. Clean up any water that spills on the floor as soon as it happens.  Remove soap buildup in the tub or shower regularly.  Attach bath mats securely with double-sided non-slip rug tape.  Do not have throw rugs and other things on the floor that can make you trip. What can I do in the bedroom?  Use night lights.  Make sure that you have a light by your bed that is easy to reach.  Do not use any sheets or blankets that are too big for your bed. They should not hang down onto the floor.  Have a firm chair that has side arms. You can use this for support while you get dressed.  Do not have throw rugs and other things on the floor that can make you trip. What can I do in the kitchen?  Clean up any spills right away.   Avoid walking on wet floors.  Keep items that you use a lot in easy-to-reach places.  If you need to reach something above you, use a strong step stool that has a grab bar.  Keep electrical cords out of the way.  Do not use floor polish or wax that makes floors slippery. If you must use wax, use non-skid floor wax.  Do not have throw rugs and other things on the floor that can make you trip. What can I do with my stairs?  Do not leave any items on the stairs.  Make sure that there are handrails on both sides of the stairs and use them. Fix handrails that are broken or loose. Make sure that handrails are as long as the stairways.  Check any carpeting to make sure that it is firmly attached to the stairs. Fix any carpet that is loose or worn.  Avoid having throw rugs at the top or bottom of the stairs. If you do have throw rugs, attach them to the floor with carpet tape.  Make sure that you have a light switch at the top of the stairs and the bottom of the stairs. If you do not have them, ask someone to add them for you. What else can I do to help prevent falls?  Wear shoes that:  Do not have high heels.  Have rubber bottoms.  Are comfortable and fit you well.  Are closed  at the toe. Do not wear sandals.  If you use a stepladder:  Make sure that it is fully opened. Do not climb a closed stepladder.  Make sure that both sides of the stepladder are locked into place.  Ask someone to hold it for you, if possible.  Clearly mark and make sure that you can see:  Any grab bars or handrails.  First and last steps.  Where the edge of each step is.  Use tools that help you move around (mobility aids) if they are needed. These include:  Canes.  Walkers.  Scooters.  Crutches.  Turn on the lights when you go into a dark area. Replace any light bulbs as soon as they burn out.  Set up your furniture so you have a clear path. Avoid moving your furniture around.  If any  of your floors are uneven, fix them.  If there are any pets around you, be aware of where they are.  Review your medicines with your doctor. Some medicines can make you feel dizzy. This can increase your chance of falling. Ask your doctor what other things that you can do to help prevent falls. This information is not intended to replace advice given to you by your health care provider. Make sure you discuss any questions you have with your health care provider. Document Released: 12/21/2008 Document Revised: 08/02/2015 Document Reviewed: 03/31/2014 Elsevier Interactive Patient Education  2017 [employer].

## 2019-03-17 ENCOUNTER — Ambulatory Visit: Payer: PPO

## 2019-03-17 ENCOUNTER — Encounter: Payer: Self-pay | Admitting: Family Medicine

## 2019-03-17 ENCOUNTER — Ambulatory Visit (INDEPENDENT_AMBULATORY_CARE_PROVIDER_SITE_OTHER): Payer: PPO | Admitting: Family Medicine

## 2019-03-17 ENCOUNTER — Other Ambulatory Visit: Payer: Self-pay

## 2019-03-17 ENCOUNTER — Ambulatory Visit: Payer: PPO | Admitting: Family Medicine

## 2019-03-17 VITALS — BP 165/72 | HR 95 | Temp 98.5°F | Ht 59.49 in | Wt 152.4 lb

## 2019-03-17 DIAGNOSIS — I499 Cardiac arrhythmia, unspecified: Secondary | ICD-10-CM | POA: Diagnosis not present

## 2019-03-17 DIAGNOSIS — R030 Elevated blood-pressure reading, without diagnosis of hypertension: Secondary | ICD-10-CM

## 2019-03-17 DIAGNOSIS — Z0000 Encounter for general adult medical examination without abnormal findings: Secondary | ICD-10-CM

## 2019-03-17 DIAGNOSIS — Z13220 Encounter for screening for lipoid disorders: Secondary | ICD-10-CM | POA: Diagnosis not present

## 2019-03-17 DIAGNOSIS — Z1231 Encounter for screening mammogram for malignant neoplasm of breast: Secondary | ICD-10-CM

## 2019-03-17 LAB — MICROSCOPIC EXAMINATION
Bacteria, UA: NONE SEEN
WBC, UA: NONE SEEN /hpf (ref 0–5)

## 2019-03-17 LAB — UA/M W/RFLX CULTURE, ROUTINE
Bilirubin, UA: NEGATIVE
Glucose, UA: NEGATIVE
Ketones, UA: NEGATIVE
Leukocytes,UA: NEGATIVE
Nitrite, UA: NEGATIVE
Protein,UA: NEGATIVE
Specific Gravity, UA: >1.03 — ABNORMAL HIGH (ref 1.005–1.030)
Urobilinogen, Ur: 0.2 mg/dL (ref 0.2–1.0)
pH, UA: 5 (ref 5.0–7.5)

## 2019-03-17 MED ORDER — IBUPROFEN 600 MG PO TABS: 600.0000 mg | ORAL_TABLET | Freq: Three times a day (TID) | ORAL | 1 refills | Status: DC | PRN

## 2019-03-17 NOTE — Progress Notes (Signed)
BP (!) 165/72   Pulse 95   Temp 98.5 F (36.9 C)   Ht 4' 11.49" (1.511 m)   Wt 152 lb 6 oz (69.1 kg)   SpO2 99%   BMI 30.27 kg/m    Subjective:    Patient ID: Vanessa Casey, female    DOB: 10/29/1950, 69 y.o.   MRN: [id number]  HPI: Vanessa Casey is a 69 y.o. female presenting on 03/17/2019 for comprehensive medical examination. Current medical complaints include:  ELEVATED BLOOD PRESSURE Duration of elevated BP: unknown BP monitoring frequency: not checking Previous BP meds: no Recent stressors: yes Family history of hypertension: yes Recurrent headaches: no Visual changes: yes Palpitations: no  Dyspnea: no Chest pain: no Lower extremity edema: no Dizzy/lightheaded: no Transient ischemic attacks: no  Menopausal Symptoms: no  Functional Status Survey: Is the patient deaf or have difficulty hearing?: No Does the patient have difficulty seeing, even when wearing glasses/contacts?: No Does the patient have difficulty concentrating, remembering, or making decisions?: No Does the patient have difficulty walking or climbing stairs?: No Does the patient have difficulty dressing or bathing?: No Does the patient have difficulty doing errands alone such as visiting a doctor's office or shopping?: No  Fall Risk  03/17/2019 03/11/2018 01/14/2018 01/12/2017  Falls in the past year? 0 0 0 No  Number falls in past yr: 0 - 1 -  Injury with Fall? 0 - 0 -    Depression Screen Depression screen [facility] 2/9 03/17/2019 01/14/2018 01/14/2018 01/12/2017  Decreased Interest 0 0 0 0  Down, Depressed, Hopeless 0 0 0 0  PHQ - 2 Score 0 0 0 0  Altered sleeping - - 0 -  Tired, decreased energy - - 0 -  Change in appetite - - 0 -  Feeling bad or failure about yourself  - - 0 -  Trouble concentrating - - 0 -  Moving slowly or fidgety/restless - - 0 -  Suicidal thoughts - - 0 -  PHQ-9 Score - - 0 -  Difficult doing work/chores - - Not difficult at all -   Advanced Directives Does patient have a  HCPOA?    no Does patient have a living will or MOST form?  no  Past Medical History:  Past Medical History:  Diagnosis Date  . Acute cholecystitis 03/04/2018  . Edema    FEET/ LEGS  . Medical history non-contributory   . Post-operative nausea and vomiting 04/30/2017  . Shingles     Surgical History:  Past Surgical History:  Procedure Laterality Date  . ABDOMINAL HYSTERECTOMY    . BASAL CELL CARCINOMA EXCISION    . CATARACT EXTRACTION W/PHACO Right 05/05/2017   Procedure: CATARACT EXTRACTION PHACO AND INTRAOCULAR LENS PLACEMENT ([facility]);  Surgeon: Birder Robson, MD;  Location: [facility] ORS;  Service: Ophthalmology;  Laterality: Right;  [place] 00:33.2 AP% 15.8 CDE 5.25 Fluid Pack Lot # [id number]  . CATARACT EXTRACTION W/PHACO Left 06/09/2017   Procedure: CATARACT EXTRACTION PHACO AND INTRAOCULAR LENS PLACEMENT ([place]);  Surgeon: Birder Robson, MD;  Location: [facility] ORS;  Service: Ophthalmology;  Laterality: Left;  [place] 01:02.2 AP% 15.2 CDE 9.48 Fluid Pack Lot # [id number]  . CHOLECYSTECTOMY N/A 03/04/2018   Procedure: LAPAROSCOPIC CHOLECYSTECTOMY;  Surgeon: Jules Husbands, MD;  Location: [facility] ORS;  Service: General;  Laterality: N/A;    Medications:  No current outpatient medications on file prior to visit.   No current facility-administered medications on file prior to visit.    Allergies:  No Known Allergies  Social History:  Social History   Socioeconomic History  . Marital status: Single    Spouse name: Not on file  . Number of children: Not on file  . Years of education: Not on file  . Highest education level: Not on file  Occupational History  . Not on file  Tobacco Use  . Smoking status: Never Smoker  . Smokeless tobacco: Never Used  Substance and Sexual Activity  . Alcohol use: No  . Drug use: No  . Sexual activity: Yes    Birth control/protection: Post-menopausal  Other Topics Concern  . Not on file  Social History Narrative  . Not on file   Social  Determinants of Health   Financial Resource Strain:   . Difficulty of Paying Living Expenses: Not on file  Food Insecurity:   . Worried About Charity fundraiser in the Last Year: Not on file  . Ran Out of Food in the Last Year: Not on file  Transportation Needs:   . Lack of Transportation (Medical): Not on file  . Lack of Transportation (Non-Medical): Not on file  Physical Activity:   . Days of Exercise per Week: Not on file  . Minutes of Exercise per Session: Not on file  Stress:   . Feeling of Stress : Not on file  Social Connections:   . Frequency of Communication with Friends and Family: Not on file  . Frequency of Social Gatherings with Friends and Family: Not on file  . Attends Religious Services: Not on file  . Active Member of Clubs or Organizations: Not on file  . Attends Archivist Meetings: Not on file  . Marital Status: Not on file  Intimate Partner Violence:   . Fear of Current or Ex-Partner: Not on file  . Emotionally Abused: Not on file  . Physically Abused: Not on file  . Sexually Abused: Not on file   Social History   Tobacco Use  Smoking Status Never Smoker  Smokeless Tobacco Never Used   Social History   Substance and Sexual Activity  Alcohol Use No    Family History:  Family History  Problem Relation Age of Onset  . Dementia Mother   . Heart failure Father   . Heart attack Sister   . Heart attack Brother   . Breast cancer Maternal Aunt     Past medical history, surgical history, medications, allergies, family history and social history reviewed with patient today and changes made to appropriate areas of the chart.   Review of Systems  Constitutional: Negative.   HENT: Negative.   Eyes: Negative.   Respiratory: Negative.   Cardiovascular: Negative.   Gastrointestinal: Negative.   Genitourinary: Negative.   Musculoskeletal: Negative.   Skin: Negative.   Endo/Heme/Allergies: Negative.     All other ROS negative except what  is listed above and in the HPI.      Objective:    BP (!) 165/72   Pulse 95   Temp 98.5 F (36.9 C)   Ht 4' 11.49" (1.511 m)   Wt 152 lb 6 oz (69.1 kg)   SpO2 99%   BMI 30.27 kg/m   Wt Readings from Last 3 Encounters:  03/17/19 152 lb 6 oz (69.1 kg)  03/15/18 161 lb (73 kg)  03/11/18 166 lb (75.3 kg)    Physical Exam Vitals and nursing note reviewed.  Constitutional:      General: She is not in acute distress.    Appearance: Normal appearance. She  is not ill-appearing, toxic-appearing or diaphoretic.  HENT:     Head: Normocephalic and atraumatic.     Right Ear: Tympanic membrane, ear canal and external ear normal. There is no impacted cerumen.     Left Ear: Tympanic membrane, ear canal and external ear normal. There is no impacted cerumen.     Nose: Nose normal. No congestion or rhinorrhea.     Mouth/Throat:     Mouth: Mucous membranes are moist.     Pharynx: Oropharynx is clear. No oropharyngeal exudate or posterior oropharyngeal erythema.  Eyes:     General: No scleral icterus.       Right eye: No discharge.        Left eye: No discharge.     Extraocular Movements: Extraocular movements intact.     Conjunctiva/sclera: Conjunctivae normal.     Pupils: Pupils are equal, round, and reactive to light.  Neck:     Vascular: No carotid bruit.  Cardiovascular:     Rate and Rhythm: Normal rate and regular rhythm.     Pulses: Normal pulses.     Heart sounds: No murmur. No friction rub. No gallop.   Pulmonary:     Effort: Pulmonary effort is normal. No respiratory distress.     Breath sounds: Normal breath sounds. No stridor. No wheezing, rhonchi or rales.  Chest:     Chest wall: No tenderness.  Abdominal:     General: Abdomen is flat. Bowel sounds are normal. There is no distension.     Palpations: Abdomen is soft. There is no mass.     Tenderness: There is no abdominal tenderness. There is no right CVA tenderness, left CVA tenderness, guarding or rebound.     Hernia: No  hernia is present.  Genitourinary:    Comments: Breast and pelvic exams deferred with shared decision making Musculoskeletal:        General: No swelling, tenderness, deformity or signs of injury.     Cervical back: Normal range of motion and neck supple. No rigidity. No muscular tenderness.     Right lower leg: No edema.     Left lower leg: No edema.  Lymphadenopathy:     Cervical: No cervical adenopathy.  Skin:    General: Skin is warm and dry.     Capillary Refill: Capillary refill takes less than 2 seconds.     Coloration: Skin is not jaundiced or pale.     Findings: No bruising, erythema, lesion or rash.  Neurological:     General: No focal deficit present.     Mental Status: She is alert and oriented to person, place, and time. Mental status is at baseline.     Cranial Nerves: No cranial nerve deficit.     Sensory: No sensory deficit.     Motor: No weakness.     Coordination: Coordination normal.     Gait: Gait normal.     Deep Tendon Reflexes: Reflexes normal.  Psychiatric:        Mood and Affect: Mood normal.        Behavior: Behavior normal.        Thought Content: Thought content normal.        Judgment: Judgment normal.     6CIT Screen 03/17/2019 01/14/2018 01/12/2017  What Year? 0 points 0 points 0 points  What month? 0 points 0 points 0 points  What time? 0 points 0 points 0 points  Count back from 20 2 points 0 points 0 points  Months  in reverse 0 points 0 points 0 points  Repeat phrase 2 points 0 points 2 points  Total Score 4 0 2    Results for orders placed or performed in visit on 03/17/19  Microscopic Examination   URINE  Result Value Ref Range   WBC, UA None seen 0 - 5 /hpf   RBC 3-10 (A) 0 - 2 /hpf   Epithelial Cells (non renal) 0-10 0 - 10 /hpf   Casts Present None seen /lpf   Cast Type Hyaline casts N/A   Mucus, UA Present Not Estab.   Bacteria, UA None seen None seen/Few  CBC with Differential OUT  Result Value Ref Range   WBC 5.3 3.4 - 10.8  x10E3/uL   RBC 4.36 3.77 - 5.28 x10E6/uL   Hemoglobin 11.8 11.1 - 15.9 g/dL   Hematocrit 37.0 34.0 - 46.6 %   MCV 85 79 - 97 fL   MCH 27.1 26.6 - 33.0 pg   MCHC 31.9 31.5 - 35.7 g/dL   RDW 13.9 11.7 - 15.4 %   Platelets 238 150 - 450 x10E3/uL   Neutrophils 61 Not Estab. %   Lymphs 29 Not Estab. %   Monocytes 8 Not Estab. %   Eos 1 Not Estab. %   Basos 1 Not Estab. %   Neutrophils Absolute 3.2 1.4 - 7.0 x10E3/uL   Lymphocytes Absolute 1.5 0.7 - 3.1 x10E3/uL   Monocytes Absolute 0.4 0.1 - 0.9 x10E3/uL   EOS (ABSOLUTE) 0.0 0.0 - 0.4 x10E3/uL   Basophils Absolute 0.0 0.0 - 0.2 x10E3/uL   Immature Granulocytes 0 Not Estab. %   Immature Grans (Abs) 0.0 0.0 - 0.1 x10E3/uL  Comp Met (CMET)  Result Value Ref Range   Glucose 74 65 - 99 mg/dL   BUN 17 8 - 27 mg/dL   Creatinine, Ser 0.73 0.57 - 1.00 mg/dL   GFR calc non Af Amer 85 >59 mL/min/1.73   GFR calc Af Amer 99 >59 mL/min/1.73   BUN/Creatinine Ratio 23 12 - 28   Sodium 141 134 - 144 mmol/L   Potassium 4.1 3.5 - 5.2 mmol/L   Chloride 103 96 - 106 mmol/L   CO2 23 20 - 29 mmol/L   Calcium 9.2 8.7 - 10.3 mg/dL   Total Protein 7.0 6.0 - 8.5 g/dL   Albumin 4.1 3.8 - 4.8 g/dL   Globulin, Total 2.9 1.5 - 4.5 g/dL   Albumin/Globulin Ratio 1.4 1.2 - 2.2   Bilirubin Total <0.2 0.0 - 1.2 mg/dL   Alkaline Phosphatase 95 39 - 117 IU/L   AST 16 0 - 40 IU/L   ALT 10 0 - 32 IU/L  Lipid Panel w/o Chol/HDL Ratio OUT  Result Value Ref Range   Cholesterol, Total 240 (H) 100 - 199 mg/dL   Triglycerides 226 (H) 0 - 149 mg/dL   HDL 52 >39 mg/dL   VLDL Cholesterol Cal 41 (H) 5 - 40 mg/dL   LDL Chol Calc (NIH) 147 (H) 0 - 99 mg/dL  TSH  Result Value Ref Range   TSH 2.330 0.450 - 4.500 uIU/mL  UA/M w/rflx Culture, Routine   Specimen: Urine   URINE  Result Value Ref Range   Specific Gravity, UA >1.030 (H) 1.005 - 1.030   pH, UA 5.0 5.0 - 7.5   Color, UA Yellow Yellow   Appearance Ur Clear Clear   Leukocytes,UA Negative Negative    Protein,UA Negative Negative/Trace   Glucose, UA Negative Negative   Ketones, UA Negative  Negative   RBC, UA 2+ (A) Negative   Bilirubin, UA Negative Negative   Urobilinogen, Ur 0.2 0.2 - 1.0 mg/dL   Nitrite, UA Negative Negative   Microscopic Examination See below:       Assessment & Plan:   Problem List Items Addressed This Visit    None    Visit Diagnoses    Encounter for Medicare annual wellness exam    -  Primary   Preventative care discussed today as below. Continue to monitor. Call with any concerns.    Routine general medical examination at a health care facility       Vaccines up to date. Screening labs checked today. Mammogram and Colonoscopy up to date. Continue diet and exercise. Call with any concerns.    Encounter for screening mammogram for malignant neoplasm of breast       Mammogram ordered today.   Irregular heart beat       Not heard today. Checking labs.   Relevant Orders   CBC with Differential OUT (Completed)   Comp Met (CMET) (Completed)   TSH (Completed)   UA/M w/rflx Culture, Routine (Completed)   Screening for cholesterol level       Labs drawn today. Await results.    Relevant Orders   Lipid Panel w/o Chol/HDL Ratio OUT (Completed)   Elevated BP without diagnosis of hypertension       Will work on [employer] and recheck 1 month. Call with any concerns.        Preventative Services:  Health Risk Assessment and Personalized Prevention Plan: Done Today Bone Mass Measurements: Up to date Breast Cancer Screening: Due in January CVD Screening: Done today Cervical Cancer Screening: N/A Colon Cancer Screening: Up to date- Due in November Depression Screening: Done today Diabetes Screening: Done today Glaucoma Screening: See your eye doctor Hepatitis B vaccine: N/A Hepatitis C screening: Up to date HIV Screening: Up to date Flu Vaccine: Up to date Lung cancer Screening: N/A Obesity Screening: Done today Pneumonia Vaccines (2): Up to date STI  Screening: N/A  Follow up plan: Return in about 4 weeks (around 04/14/2019) for Follow up Bp .   LABORATORY TESTING:  - Pap smear: not applicable  IMMUNIZATIONS:   - Tdap: Tetanus vaccination status reviewed: last tetanus booster within 10 years. - Influenza: Up to date - Pneumovax: Up to date - Prevnar: Up to date  SCREENING: -Mammogram: Ordered today  - Colonoscopy: Cologuard due in November  - Bone Density: Up to date   PATIENT COUNSELING:   Advised to take 1 mg of folate supplement per day if capable of pregnancy.   Sexuality: Discussed sexually transmitted diseases, partner selection, use of condoms, avoidance of unintended pregnancy  and contraceptive alternatives.   Advised to avoid cigarette smoking.  I discussed with the patient that most people either abstain from alcohol or drink within safe limits (<=14/week and <=4 drinks/occasion for males, <=7/weeks and <= 3 drinks/occasion for females) and that the risk for alcohol disorders and other health effects rises proportionally with the number of drinks per week and how often a drinker exceeds daily limits.  Discussed cessation/primary prevention of drug use and availability of treatment for abuse.   Diet: Encouraged to adjust caloric intake to maintain  or achieve ideal body weight, to reduce intake of dietary saturated fat and total fat, to limit sodium intake by avoiding high sodium foods and not adding table salt, and to maintain adequate dietary potassium and calcium preferably from fresh  fruits, vegetables, and low-fat dairy products.    stressed the importance of regular exercise  Injury prevention: Discussed safety belts, safety helmets, smoke detector, smoking near bedding or upholstery.   Dental health: Discussed importance of regular tooth brushing, flossing, and dental visits.    NEXT PREVENTATIVE PHYSICAL DUE IN 1 YEAR. Return in about 4 weeks (around 04/14/2019) for Follow up Bp .

## 2019-03-17 NOTE — Patient Instructions (Addendum)
Call to schedule your mammogram: [facility]  Address: [address], [place], [place] [address]  Phone: [phone]  Preventative Services:  Health Risk Assessment and Personalized Prevention Plan: Done Today Bone Mass Measurements: Up to date Breast Cancer Screening: Due in January CVD Screening: Done today Cervical Cancer Screening: N/A Colon Cancer Screening: Up to date- Due in November Depression Screening: Done today Diabetes Screening: Done today Glaucoma Screening: See your eye doctor Hepatitis B vaccine: N/A Hepatitis C screening: Up to date HIV Screening: Up to date Flu Vaccine: Up to date Lung cancer Screening: N/A Obesity Screening: Done today Pneumonia Vaccines (2): Up to date STI Screening: N/A  Health Maintenance After Age 39 After age 73, you are at a higher risk for certain long-term diseases and infections as well as injuries from falls. Falls are a major cause of broken bones and head injuries in people who are older than age 77. Getting regular preventive care can help to keep you healthy and well. Preventive care includes getting regular testing and making lifestyle changes as recommended by your health care provider. Talk with your health care provider about:  Which screenings and tests you should have. A screening is a test that checks for a disease when you have no symptoms.  A diet and exercise plan that is right for you. What should I know about screenings and tests to prevent falls? Screening and testing are the best ways to find a health problem early. Early diagnosis and treatment give you the best chance of managing medical conditions that are common after age 72. Certain conditions and lifestyle choices may make you more likely to have a fall. Your health care provider may recommend:  Regular vision checks. Poor vision and conditions such as cataracts can make you more likely to have a fall. If you wear glasses,  make sure to get your prescription updated if your vision changes.  Medicine review. Work with your health care provider to regularly review all of the medicines you are taking, including over-the-counter medicines. Ask your health care provider about any side effects that may make you more likely to have a fall. Tell your health care provider if any medicines that you take make you feel dizzy or sleepy.  Osteoporosis screening. Osteoporosis is a condition that causes the bones to get weaker. This can make the bones weak and cause them to break more easily.  Blood pressure screening. Blood pressure changes and medicines to control blood pressure can make you feel dizzy.  Strength and balance checks. Your health care provider may recommend certain tests to check your strength and balance while standing, walking, or changing positions.  Foot health exam. Foot pain and numbness, as well as not wearing proper footwear, can make you more likely to have a fall.  Depression screening. You may be more likely to have a fall if you have a fear of falling, feel emotionally low, or feel unable to do activities that you used to do.  Alcohol use screening. Using too much alcohol can affect your balance and may make you more likely to have a fall. What actions can I take to lower my risk of falls? General instructions  Talk with your health care provider about your risks for falling. Tell your health care provider if: ? You fall. Be sure to tell your health care provider about all falls, even ones that seem minor. ? You feel dizzy, sleepy, or off-balance.  Take over-the-counter  and prescription medicines only as told by your health care provider. These include any supplements.  Eat a healthy diet and maintain a healthy weight. A healthy diet includes low-fat dairy products, low-fat (lean) meats, and fiber from whole grains, beans, and lots of fruits and vegetables. Home safety  Remove any tripping  hazards, such as rugs, cords, and clutter.  Install safety equipment such as grab bars in bathrooms and safety rails on stairs.  Keep rooms and walkways well-lit. Activity   Follow a regular exercise program to stay fit. This will help you maintain your balance. Ask your health care provider what types of exercise are appropriate for you.  If you need a cane or walker, use it as recommended by your health care provider.  Wear supportive shoes that have nonskid soles. Lifestyle  Do not drink alcohol if your health care provider tells you not to drink.  If you drink alcohol, limit how much you have: ? 0-1 drink a day for women. ? 0-2 drinks a day for men.  Be aware of how much alcohol is in your drink. In the [place] one drink equals one typical bottle of beer (12 oz), one-half glass of wine (5 oz), or one shot of hard liquor (1 oz).  Do not use any products that contain nicotine or tobacco, such as cigarettes and e-cigarettes. If you need help quitting, ask your health care provider. Summary  Having a healthy lifestyle and getting preventive care can help to protect your health and wellness after age 63.  Screening and testing are the best way to find a health problem early and help you avoid having a fall. Early diagnosis and treatment give you the best chance for managing medical conditions that are more common for people who are older than age 69.  Falls are a major cause of broken bones and head injuries in people who are older than age 22. Take precautions to prevent a fall at home.  Work with your health care provider to learn what changes you can make to improve your health and wellness and to prevent falls. This information is not intended to replace advice given to you by your health care provider. Make sure you discuss any questions you have with your health care provider. Document Revised: 06/17/2018 Document Reviewed: 01/07/2017 Elsevier Patient Education  [place] DASH stands for "Dietary Approaches to Stop Hypertension." The DASH eating plan is a healthy eating plan that has been shown to reduce high blood pressure (hypertension). It may also reduce your risk for type 2 diabetes, heart disease, and stroke. The DASH eating plan may also help with weight loss. What are tips for following this plan?  General guidelines  Avoid eating more than 2,300 mg (milligrams) of salt (sodium) a day. If you have hypertension, you may need to reduce your sodium intake to 1,500 mg a day.  Limit alcohol intake to no more than 1 drink a day for nonpregnant women and 2 drinks a day for men. One drink equals 12 oz of beer, 5 oz of wine, or 1 oz of hard liquor.  Work with your health care provider to maintain a healthy body weight or to lose weight. Ask what an ideal weight is for you.  Get at least 30 minutes of exercise that causes your heart to beat faster (aerobic exercise) most days of the week. Activities may include walking, swimming, or biking.  Work with your health care provider or  diet and nutrition specialist (dietitian) to adjust your eating plan to your individual calorie needs. Reading food labels   Check food labels for the amount of sodium per serving. Choose foods with less than 5 percent of the Daily Value of sodium. Generally, foods with less than 300 mg of sodium per serving fit into this eating plan.  To find whole grains, look for the word "whole" as the first word in the ingredient list. Shopping  Buy products labeled as "low-sodium" or "no salt added."  Buy fresh foods. Avoid canned foods and premade or frozen meals. Cooking  Avoid adding salt when cooking. Use salt-free seasonings or herbs instead of table salt or sea salt. Check with your health care provider or pharmacist before using salt substitutes.  Do not fry foods. Cook foods using healthy methods such as baking, boiling, grilling, and broiling  instead.  Cook with heart-healthy oils, such as olive, canola, soybean, or sunflower oil. Meal planning  Eat a balanced diet that includes: ? 5 or more servings of fruits and vegetables each day. At each meal, try to fill half of your plate with fruits and vegetables. ? Up to 6-8 servings of whole grains each day. ? Less than 6 oz of lean meat, poultry, or fish each day. A 3-oz serving of meat is about the same size as a deck of cards. One egg equals 1 oz. ? 2 servings of low-fat dairy each day. ? A serving of nuts, seeds, or beans 5 times each week. ? Heart-healthy fats. Healthy fats called Omega-3 fatty acids are found in foods such as flaxseeds and coldwater fish, like sardines, salmon, and mackerel.  Limit how much you eat of the following: ? Canned or prepackaged foods. ? Food that is high in trans fat, such as fried foods. ? Food that is high in saturated fat, such as fatty meat. ? Sweets, desserts, sugary drinks, and other foods with added sugar. ? Full-fat dairy products.  Do not salt foods before eating.  Try to eat at least 2 vegetarian meals each week.  Eat more home-cooked food and less restaurant, buffet, and fast food.  When eating at a restaurant, ask that your food be prepared with less salt or no salt, if possible. What foods are recommended? The items listed may not be a complete list. Talk with your dietitian about what dietary choices are best for you. Grains Whole-grain or whole-wheat bread. Whole-grain or whole-wheat pasta. Brown rice. [place]. Bulgur. Whole-grain and low-sodium cereals. Pita bread. Low-fat, low-sodium crackers. Whole-wheat flour tortillas. Vegetables Fresh or frozen vegetables (raw, steamed, roasted, or grilled). Low-sodium or reduced-sodium tomato and vegetable juice. Low-sodium or reduced-sodium tomato sauce and tomato paste. Low-sodium or reduced-sodium canned vegetables. Fruits All fresh, dried, or frozen fruit. Canned fruit in  natural juice (without added sugar). Meat and other protein foods Skinless chicken or [place]. Ground chicken or [place]. Pork with fat trimmed off. Fish and seafood. Egg whites. Dried beans, peas, or lentils. Unsalted nuts, nut butters, and seeds. Unsalted canned beans. Lean cuts of beef with fat trimmed off. Low-sodium, lean deli meat. Dairy Low-fat (1%) or fat-free (skim) milk. Fat-free, low-fat, or reduced-fat cheeses. Nonfat, low-sodium ricotta or cottage cheese. Low-fat or nonfat yogurt. Low-fat, low-sodium cheese. Fats and oils Soft margarine without trans fats. Vegetable oil. Low-fat, reduced-fat, or light mayonnaise and salad dressings (reduced-sodium). Canola, safflower, olive, soybean, and sunflower oils. Avocado. Seasoning and other foods Herbs. Spices. Seasoning mixes without salt. Unsalted popcorn and pretzels. Fat-free  sweets. What foods are not recommended? The items listed may not be a complete list. Talk with your dietitian about what dietary choices are best for you. Grains Baked goods made with fat, such as croissants, muffins, or some breads. Dry pasta or rice meal packs. Vegetables Creamed or fried vegetables. Vegetables in a cheese sauce. Regular canned vegetables (not low-sodium or reduced-sodium). Regular canned tomato sauce and paste (not low-sodium or reduced-sodium). Regular tomato and vegetable juice (not low-sodium or reduced-sodium). Angie Fava. Olives. Fruits Canned fruit in a light or heavy syrup. Fried fruit. Fruit in cream or butter sauce. Meat and other protein foods Fatty cuts of meat. Ribs. Fried meat. Berniece Salines. Sausage. Bologna and other processed lunch meats. Salami. Fatback. Hotdogs. Bratwurst. Salted nuts and seeds. Canned beans with added salt. Canned or smoked fish. Whole eggs or egg yolks. Chicken or [place] with skin. Dairy Whole or 2% milk, cream, and half-and-half. Whole or full-fat cream cheese. Whole-fat or sweetened yogurt. Full-fat cheese. Nondairy  creamers. Whipped toppings. Processed cheese and cheese spreads. Fats and oils Butter. Stick margarine. Lard. Shortening. Ghee. Bacon fat. Tropical oils, such as coconut, palm kernel, or palm oil. Seasoning and other foods Salted popcorn and pretzels. Onion salt, garlic salt, seasoned salt, table salt, and sea salt. Worcestershire sauce. Tartar sauce. Barbecue sauce. Teriyaki sauce. Soy sauce, including reduced-sodium. Steak sauce. Canned and packaged gravies. Fish sauce. Oyster sauce. Cocktail sauce. Horseradish that you find on the shelf. Ketchup. Mustard. Meat flavorings and tenderizers. Bouillon cubes. Hot sauce and Tabasco sauce. Premade or packaged marinades. Premade or packaged taco seasonings. Relishes. Regular salad dressings. Where to find more information:  National Heart, Lung, and [place]: https://wilson-eaton.com/  American Heart Association: www.heart.org Summary  The DASH eating plan is a healthy eating plan that has been shown to reduce high blood pressure (hypertension). It may also reduce your risk for type 2 diabetes, heart disease, and stroke.  With the DASH eating plan, you should limit salt (sodium) intake to 2,300 mg a day. If you have hypertension, you may need to reduce your sodium intake to 1,500 mg a day.  When on the DASH eating plan, aim to eat more fresh fruits and vegetables, whole grains, lean proteins, low-fat dairy, and heart-healthy fats.  Work with your health care provider or diet and nutrition specialist (dietitian) to adjust your eating plan to your individual calorie needs. This information is not intended to replace advice given to you by your health care provider. Make sure you discuss any questions you have with your health care provider. Document Revised: 02/06/2017 Document Reviewed: 02/18/2016 Elsevier Patient Education  2020 [employer].

## 2019-03-18 LAB — CBC WITH DIFFERENTIAL/PLATELET
Basophils Absolute: 0 10*3/uL (ref 0.0–0.2)
Basos: 1 %
EOS (ABSOLUTE): 0 10*3/uL (ref 0.0–0.4)
Eos: 1 %
Hematocrit: 37 % (ref 34.0–46.6)
Hemoglobin: 11.8 g/dL (ref 11.1–15.9)
Immature Grans (Abs): 0 10*3/uL (ref 0.0–0.1)
Immature Granulocytes: 0 %
Lymphocytes Absolute: 1.5 10*3/uL (ref 0.7–3.1)
Lymphs: 29 %
MCH: 27.1 pg (ref 26.6–33.0)
MCHC: 31.9 g/dL (ref 31.5–35.7)
MCV: 85 fL (ref 79–97)
Monocytes Absolute: 0.4 10*3/uL (ref 0.1–0.9)
Monocytes: 8 %
Neutrophils Absolute: 3.2 10*3/uL (ref 1.4–7.0)
Neutrophils: 61 %
Platelets: 238 10*3/uL (ref 150–450)
RBC: 4.36 x10E6/uL (ref 3.77–5.28)
RDW: 13.9 % (ref 11.7–15.4)
WBC: 5.3 10*3/uL (ref 3.4–10.8)

## 2019-03-18 LAB — COMPREHENSIVE METABOLIC PANEL
ALT: 10 IU/L (ref 0–32)
AST: 16 IU/L (ref 0–40)
Albumin/Globulin Ratio: 1.4 (ref 1.2–2.2)
Albumin: 4.1 g/dL (ref 3.8–4.8)
Alkaline Phosphatase: 95 IU/L (ref 39–117)
BUN/Creatinine Ratio: 23 (ref 12–28)
BUN: 17 mg/dL (ref 8–27)
Bilirubin Total: <0.2 mg/dL (ref 0.0–1.2)
CO2: 23 mmol/L (ref 20–29)
Calcium: 9.2 mg/dL (ref 8.7–10.3)
Chloride: 103 mmol/L (ref 96–106)
Creatinine, Ser: 0.73 mg/dL (ref 0.57–1.00)
GFR calc Af Amer: 99 mL/min/{1.73_m2} (ref >59)
GFR calc non Af Amer: 85 mL/min/{1.73_m2} (ref >59)
Globulin, Total: 2.9 g/dL (ref 1.5–4.5)
Glucose: 74 mg/dL (ref 65–99)
Potassium: 4.1 mmol/L (ref 3.5–5.2)
Sodium: 141 mmol/L (ref 134–144)
Total Protein: 7 g/dL (ref 6.0–8.5)

## 2019-03-18 LAB — TSH: TSH: 2.33 u[IU]/mL (ref 0.450–4.500)

## 2019-03-18 LAB — LIPID PANEL W/O CHOL/HDL RATIO
Cholesterol, Total: 240 mg/dL — ABNORMAL HIGH (ref 100–199)
HDL: 52 mg/dL (ref >39)
LDL Chol Calc (NIH): 147 mg/dL — ABNORMAL HIGH (ref 0–99)
Triglycerides: 226 mg/dL — ABNORMAL HIGH (ref 0–149)
VLDL Cholesterol Cal: 41 mg/dL — ABNORMAL HIGH (ref 5–40)

## 2019-03-21 ENCOUNTER — Encounter: Payer: Self-pay | Admitting: Family Medicine

## 2019-04-14 ENCOUNTER — Ambulatory Visit: Payer: PPO | Admitting: Family Medicine

## 2019-04-21 ENCOUNTER — Ambulatory Visit (INDEPENDENT_AMBULATORY_CARE_PROVIDER_SITE_OTHER): Payer: PPO

## 2019-04-21 DIAGNOSIS — Z0000 Encounter for general adult medical examination without abnormal findings: Secondary | ICD-10-CM

## 2019-04-21 DIAGNOSIS — Z1231 Encounter for screening mammogram for malignant neoplasm of breast: Secondary | ICD-10-CM

## 2019-04-21 NOTE — Patient Instructions (Signed)
Vanessa Casey , Thank you for taking time to come for your Medicare Wellness Visit. I appreciate your ongoing commitment to your health goals. Please review the following plan we discussed and let me know if I can assist you in the future.   Screening recommendations/referrals: Colonoscopy: cologuard completed 01/2017 Mammogram: Please call [phone] to schedule your mammogram.  Bone Density: up to date Recommended yearly ophthalmology/optometry visit for glaucoma screening and checkup Recommended yearly dental visit for hygiene and checkup  Vaccinations: Influenza vaccine: up to date Pneumococcal vaccine: up to date Tdap vaccine: up to date Shingles vaccine: shingrix eligible     Advanced directives: please pick up a copy of this information next time you are in the office   Conditions/risks identified: none  Next appointment: Follow up in one year for your annual wellness visit    Preventive Care 12 Years and Older, Female Preventive care refers to lifestyle choices and visits with your health care provider that can promote health and wellness. What does preventive care include?  A yearly physical exam. This is also called an annual well check.  Dental exams once or twice a year.  Routine eye exams. Ask your health care provider how often you should have your eyes checked.  Personal lifestyle choices, including:  Daily care of your teeth and gums.  Regular physical activity.  Eating a healthy diet.  Avoiding tobacco and drug use.  Limiting alcohol use.  Practicing safe sex.  Taking low-dose aspirin every day.  Taking vitamin and mineral supplements as recommended by your health care provider. What happens during an annual well check? The services and screenings done by your health care provider during your annual well check will depend on your age, overall health, lifestyle risk factors, and family history of disease. Counseling  Your health care provider may  ask you questions about your:  Alcohol use.  Tobacco use.  Drug use.  Emotional well-being.  Home and relationship well-being.  Sexual activity.  Eating habits.  History of falls.  Memory and ability to understand (cognition).  Work and work Statistician.  Reproductive health. Screening  You may have the following tests or measurements:  Height, weight, and BMI.  Blood pressure.  Lipid and cholesterol levels. These may be checked every 5 years, or more frequently if you are over 73 years old.  Skin check.  Lung cancer screening. You may have this screening every year starting at age 88 if you have a 30-pack-year history of smoking and currently smoke or have quit within the past 15 years.  Fecal occult blood test (FOBT) of the stool. You may have this test every year starting at age 38.  Flexible sigmoidoscopy or colonoscopy. You may have a sigmoidoscopy every 5 years or a colonoscopy every 10 years starting at age 57.  Hepatitis C blood test.  Hepatitis B blood test.  Sexually transmitted disease (STD) testing.  Diabetes screening. This is done by checking your blood sugar (glucose) after you have not eaten for a while (fasting). You may have this done every 1-3 years.  Bone density scan. This is done to screen for osteoporosis. You may have this done starting at age 15.  Mammogram. This may be done every 1-2 years. Talk to your health care provider about how often you should have regular mammograms. Talk with your health care provider about your test results, treatment options, and if necessary, the need for more tests. Vaccines  Your health care provider may recommend certain vaccines, such  as:  Influenza vaccine. This is recommended every year.  Tetanus, diphtheria, and acellular pertussis (Tdap, Td) vaccine. You may need a Td booster every 10 years.  Zoster vaccine. You may need this after age 14.  Pneumococcal 13-valent conjugate (PCV13) vaccine. One  dose is recommended after age 49.  Pneumococcal polysaccharide (PPSV23) vaccine. One dose is recommended after age 49. Talk to your health care provider about which screenings and vaccines you need and how often you need them. This information is not intended to replace advice given to you by your health care provider. Make sure you discuss any questions you have with your health care provider. Document Released: 03/23/2015 Document Revised: 11/14/2015 Document Reviewed: 12/26/2014 Elsevier Interactive Patient Education  2017 [place] Prevention in the Home Falls can cause injuries. They can happen to people of all ages. There are many things you can do to make your home safe and to help prevent falls. What can I do on the outside of my home?  Regularly fix the edges of walkways and driveways and fix any cracks.  Remove anything that might make you trip as you walk through a door, such as a raised step or threshold.  Trim any bushes or trees on the path to your home.  Use bright outdoor lighting.  Clear any walking paths of anything that might make someone trip, such as rocks or tools.  Regularly check to see if handrails are loose or broken. Make sure that both sides of any steps have handrails.  Any raised decks and porches should have guardrails on the edges.  Have any leaves, snow, or ice cleared regularly.  Use sand or salt on walking paths during winter.  Clean up any spills in your garage right away. This includes oil or grease spills. What can I do in the bathroom?  Use night lights.  Install grab bars by the toilet and in the tub and shower. Do not use towel bars as grab bars.  Use non-skid mats or decals in the tub or shower.  If you need to sit down in the shower, use a plastic, non-slip stool.  Keep the floor dry. Clean up any water that spills on the floor as soon as it happens.  Remove soap buildup in the tub or shower regularly.  Attach bath  mats securely with double-sided non-slip rug tape.  Do not have throw rugs and other things on the floor that can make you trip. What can I do in the bedroom?  Use night lights.  Make sure that you have a light by your bed that is easy to reach.  Do not use any sheets or blankets that are too big for your bed. They should not hang down onto the floor.  Have a firm chair that has side arms. You can use this for support while you get dressed.  Do not have throw rugs and other things on the floor that can make you trip. What can I do in the kitchen?  Clean up any spills right away.  Avoid walking on wet floors.  Keep items that you use a lot in easy-to-reach places.  If you need to reach something above you, use a strong step stool that has a grab bar.  Keep electrical cords out of the way.  Do not use floor polish or wax that makes floors slippery. If you must use wax, use non-skid floor wax.  Do not have throw rugs and other things on the  floor that can make you trip. What can I do with my stairs?  Do not leave any items on the stairs.  Make sure that there are handrails on both sides of the stairs and use them. Fix handrails that are broken or loose. Make sure that handrails are as long as the stairways.  Check any carpeting to make sure that it is firmly attached to the stairs. Fix any carpet that is loose or worn.  Avoid having throw rugs at the top or bottom of the stairs. If you do have throw rugs, attach them to the floor with carpet tape.  Make sure that you have a light switch at the top of the stairs and the bottom of the stairs. If you do not have them, ask someone to add them for you. What else can I do to help prevent falls?  Wear shoes that:  Do not have high heels.  Have rubber bottoms.  Are comfortable and fit you well.  Are closed at the toe. Do not wear sandals.  If you use a stepladder:  Make sure that it is fully opened. Do not climb a closed  stepladder.  Make sure that both sides of the stepladder are locked into place.  Ask someone to hold it for you, if possible.  Clearly mark and make sure that you can see:  Any grab bars or handrails.  First and last steps.  Where the edge of each step is.  Use tools that help you move around (mobility aids) if they are needed. These include:  Canes.  Walkers.  Scooters.  Crutches.  Turn on the lights when you go into a dark area. Replace any light bulbs as soon as they burn out.  Set up your furniture so you have a clear path. Avoid moving your furniture around.  If any of your floors are uneven, fix them.  If there are any pets around you, be aware of where they are.  Review your medicines with your doctor. Some medicines can make you feel dizzy. This can increase your chance of falling. Ask your doctor what other things that you can do to help prevent falls. This information is not intended to replace advice given to you by your health care provider. Make sure you discuss any questions you have with your health care provider. Document Released: 12/21/2008 Document Revised: 08/02/2015 Document Reviewed: 03/31/2014 Elsevier Interactive Patient Education  2017 [employer].

## 2019-04-21 NOTE — Progress Notes (Signed)
Subjective:   Vanessa Casey is a 69 y.o. female who presents for Medicare Annual (Subsequent) preventive examination.  This visit is being conducted via phone call  - after an attmept to do on video chat - due to the COVID-19 pandemic. This patient has given me verbal consent via phone to conduct this visit, patient states they are participating from their home address. Some vital signs may be absent or patient reported.   Patient identification: identified by name, DOB, and current address.    Review of Systems:   Cardiac Risk Factors include: advanced age (>38men, >45 women);dyslipidemia;hypertension     Objective:     Vitals: There were no vitals taken for this visit.  There is no height or weight on file to calculate BMI.  Advanced Directives 04/21/2019 03/03/2018 01/14/2018 06/09/2017 05/05/2017 11/08/2014  Does Patient Have a Medical Advance Directive? No No No No No No  Would patient like information on creating a medical advance directive? - No - Patient declined Yes (MAU/Ambulatory/Procedural Areas - Information given) No - Patient declined - Yes - Educational materials given    Tobacco Social History   Tobacco Use  Smoking Status Never Smoker  Smokeless Tobacco Never Used     Counseling given: Not Answered   Clinical Intake:  Pre-visit preparation completed: Yes  Pain : No/denies pain     Nutritional Risks: None Diabetes: No  How often do you need to have someone help you when you read instructions, pamphlets, or other written materials from your doctor or pharmacy?: 1 - Never  Interpreter Needed?: No  Information entered by :: Wm Sahagun,LPN  Past Medical History:  Diagnosis Date   Acute cholecystitis 03/04/2018   Edema    FEET/ LEGS   Medical history non-contributory    Post-operative nausea and vomiting 04/30/2017   Shingles    Past Surgical History:  Procedure Laterality Date   ABDOMINAL HYSTERECTOMY     BASAL CELL CARCINOMA EXCISION       CATARACT EXTRACTION W/PHACO Right 05/05/2017   Procedure: CATARACT EXTRACTION PHACO AND INTRAOCULAR LENS PLACEMENT ([place]);  Surgeon: Birder Robson, MD;  Location: [facility] ORS;  Service: Ophthalmology;  Laterality: Right;  [place] 00:33.2 AP% 15.8 CDE 5.25 Fluid Pack Lot # [id number]   CATARACT EXTRACTION W/PHACO Left 06/09/2017   Procedure: CATARACT EXTRACTION PHACO AND INTRAOCULAR LENS PLACEMENT ([facility]);  Surgeon: Birder Robson, MD;  Location: [facility] OR[facility];  Service: Ophthalmology;  Laterality: Left;  [place] 01:02.2 AP% 15.2 CDE 9.48 Fluid Pack Lot # [id number]   CHOLECYSTECTOMY N/A 03/04/2018   Procedure: LAPAROSCOPIC CHOLECYSTECTOMY;  Surgeon: Jules Husbands, MD;  Location: [facility] ORS;  Service: General;  Laterality: N/A;   Family History  Problem Relation Age of Onset   Dementia Mother    Heart failure Father    Heart attack Sister    Heart attack Brother    Breast cancer Maternal Aunt    Social History   Socioeconomic History   Marital status: Single    Spouse name: Not on file   Number of children: Not on file   Years of education: Not on file   Highest education level: Not on file  Occupational History   Not on file  Tobacco Use   Smoking status: Never Smoker   Smokeless tobacco: Never Used  Substance and Sexual Activity   Alcohol use: No   Drug use: No   Sexual activity: Yes    Birth control/protection: Post-menopausal  Other Topics Concern   Not on file  Social History Narrative   Not on file   Social Determinants of Health   Financial Resource Strain:    Difficulty of Paying Living Expenses: Not on file  Food Insecurity:    Worried About Charity fundraiser in the Last Year: Not on file   [employer] of Food in the Last Year: Not on file  Transportation Needs:    Lack of Transportation (Medical): Not on file   Lack of Transportation (Non-Medical): Not on file  Physical Activity:    Days of Exercise per Week: Not on file   Minutes of Exercise per  Session: Not on file  Stress:    Feeling of Stress : Not on file  Social Connections:    Frequency of Communication with Friends and Family: Not on file   Frequency of Social Gatherings with Friends and Family: Not on file   Attends Religious Services: Not on file   Active Member of Clubs or Organizations: Not on file   Attends Club or Organization Meetings: Not on file   Marital Status: Not on file    Outpatient Encounter Medications as of 04/21/2019  Medication Sig   ibuprofen (ADVIL) 600 MG tablet Take 1 tablet (600 mg total) by mouth every 8 (eight) hours as needed for headache or moderate pain.   No facility-administered encounter medications on file as of 04/21/2019.    Activities of Daily Living In your present state of health, do you have any difficulty performing the following activities: 04/21/2019 03/17/2019  Hearing? N N  Comment no hearing aids -  Vision? N N  Comment reading glasses, eye dr annually -  Difficulty concentrating or making decisions? N N  Walking or climbing stairs? N N  Dressing or bathing? N N  Doing errands, shopping? N N  Preparing Food and eating ? N -  Using the Toilet? N -  In the past six months, have you accidently leaked urine? N -  Do you have problems with loss of bowel control? N -  Managing your Medications? N -  Managing your Finances? N -  Housekeeping or managing your Housekeeping? N -  Some recent data might be hidden    Patient Care Team: Valerie Roys, DO as PCP - General (Family Medicine) Anell Barr, OD (Optometry)    Assessment:   This is a routine wellness examination for Vanessa Casey.  Exercise Activities and Dietary recommendations Intensity: Mild, Exercise limited by: None identified  Goals   None     Fall Risk: Fall Risk  03/17/2019 03/11/2018 01/14/2018 01/12/2017  Falls in the past year? 0 0 0 No  Number falls in past yr: 0 - 1 -  Injury with Fall? 0 - 0 -    FALL RISK PREVENTION PERTAINING TO THE  HOME:  Any stairs in or around the home? No  If so, are there any without handrails? No   Home free of loose throw rugs in walkways, pet beds, electrical cords, etc? Yes  Adequate lighting in your home to reduce risk of falls? Yes   ASSISTIVE DEVICES UTILIZED TO PREVENT FALLS:  Life alert? No  Use of a cane, walker or w/c? No  Grab bars in the bathroom? No  Shower chair or bench in shower? No  Elevated toilet seat or a handicapped toilet? No   DME ORDERS:  DME order needed?  No   TIMED UP AND GO:  Unable to perform    Depression Screen [facility] 2/9 Scores 04/21/2019 03/17/2019  01/14/2018 01/14/2018  PHQ - 2 Score 0 0 0 0  PHQ- 9 Score - - - 0     Cognitive Function     6CIT Screen 03/17/2019 01/14/2018 01/12/2017  What Year? 0 points 0 points 0 points  What month? 0 points 0 points 0 points  What time? 0 points 0 points 0 points  Count back from 20 2 points 0 points 0 points  Months in reverse 0 points 0 points 0 points  Repeat phrase 2 points 0 points 2 points  Total Score 4 0 2    Immunization History  Administered Date(s) Administered   Fluad Quad(high Dose 65+) 12/03/2018   Influenza, High Dose Seasonal PF 12/16/2017   Influenza,inj,Quad PF,6+ Mos 12/11/2016   Pneumococcal Conjugate-13 01/12/2017   Pneumococcal Polysaccharide-23 01/14/2018   Td 11/08/2014    Qualifies for Shingles Vaccine? Yes  Zostavax completed n/a. Due for Shingrix. Education has been provided regarding the importance of this vaccine. Pt has been advised to call insurance company to determine out of pocket expense. Advised may also receive vaccine at local pharmacy or Health Dept. Verbalized acceptance and understanding.  Tdap: up to date   Flu Vaccine: up to date   Pneumococcal Vaccine: up to date   Covid-19:  Information provided   Screening Tests Health Maintenance  Topic Date Due   Fecal DNA (Cologuard)  01/14/2020   MAMMOGRAM  04/06/2020   TETANUS/TDAP  11/07/2024    INFLUENZA VACCINE  Completed   DEXA SCAN  Completed   Hepatitis C Screening  Completed   PNA vac Low Risk Adult  Completed    Cancer Screenings:  Colorectal Screening: Completed cologuard 01/13/2017. Repeat every 3 years  Mammogram: Completed 04/06/2018. Repeat every year; ordered   Bone Density: Completed 04/06/2018.  Lung Cancer Screening: (Low Dose CT Chest recommended if Age 30-80 years, 30 pack-year currently smoking OR have quit w/in 15years.) does not qualify.     Additional Screening:  Hepatitis C Screening: does qualify; Completed 2018  Vision Screening: Recommended annual ophthalmology exams for early detection of glaucoma and other disorders of the eye. Is the patient up to date with their annual eye exam?  Yes    Dental Screening: Recommended annual dental exams for proper oral hygiene  Community Resource Referral:  CRR required this visit?  No       Plan:  I have personally reviewed and addressed the Medicare Annual Wellness questionnaire and have noted the following in the patients chart:  A. Medical and social history B. Use of alcohol, tobacco or illicit drugs  C. Current medications and supplements D. Functional ability and status E.  Nutritional status F.  Physical activity G. Advance directives H. List of other physicians I.  Hospitalizations, surgeries, and ER visits in previous 12 months J.  Canon such as hearing and vision if needed, cognitive and depression L. Referrals and appointments   In addition, I have reviewed and discussed with patient certain preventive protocols, quality metrics, and best practice recommendations. A written personalized care plan for preventive services as well as general preventive health recommendations were provided to patient.  Signed,    Bevelyn Ngo, LPN  [id number] Nurse Health Advisor   Nurse Notes:none

## 2019-04-22 ENCOUNTER — Ambulatory Visit (INDEPENDENT_AMBULATORY_CARE_PROVIDER_SITE_OTHER): Payer: PPO | Admitting: Family Medicine

## 2019-04-22 ENCOUNTER — Other Ambulatory Visit: Payer: Self-pay

## 2019-04-22 ENCOUNTER — Encounter: Payer: Self-pay | Admitting: Family Medicine

## 2019-04-22 DIAGNOSIS — I10 Essential (primary) hypertension: Secondary | ICD-10-CM

## 2019-04-22 MED ORDER — LISINOPRIL 10 MG PO TABS: 10.0000 mg | ORAL_TABLET | Freq: Every day | ORAL | 3 refills | Status: DC

## 2019-04-22 NOTE — Progress Notes (Signed)
BP (!) 147/78 (BP Location: Left Arm, Cuff Size: Normal)   Pulse 63   Temp 97.7 F (36.5 C) (Oral)   SpO2 100%    Subjective:    Patient ID: Vanessa Casey, female    DOB: 11-20-1950, 69 y.o.   MRN: [id number]  HPI: Vanessa Casey is a 69 y.o. female  Chief Complaint  Patient presents with  . Hypertension   HYPERTENSION Hypertension status: uncontrolled  Satisfied with current treatment? no Duration of hypertension: months BP monitoring frequency:  not checking Aspirin: no Recurrent headaches: no Visual changes: no Palpitations: no Dyspnea: no Chest pain: no Lower extremity edema: no Dizzy/lightheaded: no   Relevant past medical, surgical, family and social history reviewed and updated as indicated. Interim medical history since our last visit reviewed. Allergies and medications reviewed and updated.  Review of Systems  Constitutional: Negative.   Respiratory: Negative.   Cardiovascular: Negative.   Gastrointestinal: Negative.   Psychiatric/Behavioral: Negative.     Per HPI unless specifically indicated above     Objective:    BP (!) 147/78 (BP Location: Left Arm, Cuff Size: Normal)   Pulse 63   Temp 97.7 F (36.5 C) (Oral)   SpO2 100%   Wt Readings from Last 3 Encounters:  03/17/19 152 lb 6 oz (69.1 kg)  03/15/18 161 lb (73 kg)  03/11/18 166 lb (75.3 kg)    Physical Exam Vitals and nursing note reviewed.  Constitutional:      General: She is not in acute distress.    Appearance: Normal appearance. She is not ill-appearing, toxic-appearing or diaphoretic.  HENT:     Head: Normocephalic and atraumatic.     Right Ear: External ear normal.     Left Ear: External ear normal.     Nose: Nose normal.     Mouth/Throat:     Mouth: Mucous membranes are moist.     Pharynx: Oropharynx is clear.  Eyes:     General: No scleral icterus.       Right eye: No discharge.        Left eye: No discharge.     Extraocular Movements: Extraocular movements intact.   Conjunctiva/sclera: Conjunctivae normal.     Pupils: Pupils are equal, round, and reactive to light.  Cardiovascular:     Rate and Rhythm: Normal rate and regular rhythm.     Pulses: Normal pulses.     Heart sounds: Normal heart sounds. No murmur. No friction rub. No gallop.   Pulmonary:     Effort: Pulmonary effort is normal. No respiratory distress.     Breath sounds: Normal breath sounds. No stridor. No wheezing, rhonchi or rales.  Chest:     Chest wall: No tenderness.  Musculoskeletal:        General: Normal range of motion.     Cervical back: Normal range of motion and neck supple.  Skin:    General: Skin is warm and dry.     Capillary Refill: Capillary refill takes less than 2 seconds.     Coloration: Skin is not jaundiced or pale.     Findings: No bruising, erythema, lesion or rash.  Neurological:     General: No focal deficit present.     Mental Status: She is alert and oriented to person, place, and time. Mental status is at baseline.  Psychiatric:        Mood and Affect: Mood normal.        Behavior: Behavior normal.  Thought Content: Thought content normal.        Judgment: Judgment normal.     Results for orders placed or performed in visit on 03/17/19  Microscopic Examination   URINE  Result Value Ref Range   WBC, UA None seen 0 - 5 /hpf   RBC 3-10 (A) 0 - 2 /hpf   Epithelial Cells (non renal) 0-10 0 - 10 /hpf   Casts Present None seen /lpf   Cast Type Hyaline casts N/A   Mucus, UA Present Not Estab.   Bacteria, UA None seen None seen/Few  CBC with Differential OUT  Result Value Ref Range   WBC 5.3 3.4 - 10.8 x10E3/uL   RBC 4.36 3.77 - 5.28 x10E6/uL   Hemoglobin 11.8 11.1 - 15.9 g/dL   Hematocrit 37.0 34.0 - 46.6 %   MCV 85 79 - 97 fL   MCH 27.1 26.6 - 33.0 pg   MCHC 31.9 31.5 - 35.7 g/dL   RDW 13.9 11.7 - 15.4 %   Platelets 238 150 - 450 x10E3/uL   Neutrophils 61 Not Estab. %   Lymphs 29 Not Estab. %   Monocytes 8 Not Estab. %   Eos 1 Not  Estab. %   Basos 1 Not Estab. %   Neutrophils Absolute 3.2 1.4 - 7.0 x10E3/uL   Lymphocytes Absolute 1.5 0.7 - 3.1 x10E3/uL   Monocytes Absolute 0.4 0.1 - 0.9 x10E3/uL   EOS (ABSOLUTE) 0.0 0.0 - 0.4 x10E3/uL   Basophils Absolute 0.0 0.0 - 0.2 x10E3/uL   Immature Granulocytes 0 Not Estab. %   Immature Grans (Abs) 0.0 0.0 - 0.1 x10E3/uL  Comp Met (CMET)  Result Value Ref Range   Glucose 74 65 - 99 mg/dL   BUN 17 8 - 27 mg/dL   Creatinine, Ser 0.73 0.57 - 1.00 mg/dL   GFR calc non Af Amer 85 >59 mL/min/1.73   GFR calc Af Amer 99 >59 mL/min/1.73   BUN/Creatinine Ratio 23 12 - 28   Sodium 141 134 - 144 mmol/L   Potassium 4.1 3.5 - 5.2 mmol/L   Chloride 103 96 - 106 mmol/L   CO2 23 20 - 29 mmol/L   Calcium 9.2 8.7 - 10.3 mg/dL   Total Protein 7.0 6.0 - 8.5 g/dL   Albumin 4.1 3.8 - 4.8 g/dL   Globulin, Total 2.9 1.5 - 4.5 g/dL   Albumin/Globulin Ratio 1.4 1.2 - 2.2   Bilirubin Total <0.2 0.0 - 1.2 mg/dL   Alkaline Phosphatase 95 39 - 117 IU/L   AST 16 0 - 40 IU/L   ALT 10 0 - 32 IU/L  Lipid Panel w/o Chol/HDL Ratio OUT  Result Value Ref Range   Cholesterol, Total 240 (H) 100 - 199 mg/dL   Triglycerides 226 (H) 0 - 149 mg/dL   HDL 52 >39 mg/dL   VLDL Cholesterol Cal 41 (H) 5 - 40 mg/dL   LDL Chol Calc (NIH) 147 (H) 0 - 99 mg/dL  TSH  Result Value Ref Range   TSH 2.330 0.450 - 4.500 uIU/mL  UA/M w/rflx Culture, Routine   Specimen: Urine   URINE  Result Value Ref Range   Specific Gravity, UA >1.030 (H) 1.005 - 1.030   pH, UA 5.0 5.0 - 7.5   Color, UA Yellow Yellow   Appearance Ur Clear Clear   Leukocytes,UA Negative Negative   Protein,UA Negative Negative/Trace   Glucose, UA Negative Negative   Ketones, UA Negative Negative   RBC, UA 2+ (A) Negative  Bilirubin, UA Negative Negative   Urobilinogen, Ur 0.2 0.2 - 1.0 mg/dL   Nitrite, UA Negative Negative   Microscopic Examination See below:       Assessment & Plan:   Problem List Items Addressed This Visit       Cardiovascular and Mediastinum   HTN (hypertension)    Still running a little high. Will start lisinopril and recheck 1 month. Call with any concerns. Continue to monitor.       Relevant Medications   lisinopril (ZESTRIL) 10 MG tablet       Follow up plan: Return in about 4 weeks (around 05/20/2019).

## 2019-04-24 ENCOUNTER — Encounter: Payer: Self-pay | Admitting: Family Medicine

## 2019-04-24 DIAGNOSIS — I10 Essential (primary) hypertension: Secondary | ICD-10-CM | POA: Insufficient documentation

## 2019-04-24 NOTE — Assessment & Plan Note (Signed)
Still running a little high. Will start lisinopril and recheck 1 month. Call with any concerns. Continue to monitor.

## 2019-05-12 ENCOUNTER — Ambulatory Visit: Payer: PPO | Attending: Internal Medicine

## 2019-05-12 DIAGNOSIS — Z23 Encounter for immunization: Secondary | ICD-10-CM

## 2019-05-12 NOTE — Progress Notes (Signed)
   Covid-19 Vaccination Clinic  Name:  Vanessa Casey    MRN: [id number] DOB: [birth year]  05/12/2019  Vanessa Casey was observed post Covid-19 immunization for 15 minutes without incident. She was provided with Vaccine Information Sheet and instruction to access the V-Safe system.   Vanessa Casey was instructed to call 911 with any severe reactions post vaccine: Marland Kitchen Difficulty breathing  . Swelling of face and throat  . A fast heartbeat  . A bad rash all over body  . Dizziness and weakness   Immunizations Administered    Name Date Dose VIS Date Route   Pfizer COVID-19 Vaccine 05/12/2019  9:39 AM 0.3 mL 02/18/2019 Intramuscular   Manufacturer: [place]   Lot: [id number]   [place]: [id number]

## 2019-05-26 ENCOUNTER — Ambulatory Visit: Payer: PPO | Admitting: Dermatology

## 2019-05-26 ENCOUNTER — Other Ambulatory Visit: Payer: Self-pay

## 2019-05-26 DIAGNOSIS — C44712 Basal cell carcinoma of skin of right lower limb, including hip: Secondary | ICD-10-CM | POA: Diagnosis not present

## 2019-05-26 DIAGNOSIS — D2230 Melanocytic nevi of unspecified part of face: Secondary | ICD-10-CM

## 2019-05-26 DIAGNOSIS — L821 Other seborrheic keratosis: Secondary | ICD-10-CM | POA: Diagnosis not present

## 2019-05-26 DIAGNOSIS — Z1283 Encounter for screening for malignant neoplasm of skin: Secondary | ICD-10-CM

## 2019-05-26 DIAGNOSIS — L853 Xerosis cutis: Secondary | ICD-10-CM | POA: Diagnosis not present

## 2019-05-26 DIAGNOSIS — Z86018 Personal history of other benign neoplasm: Secondary | ICD-10-CM | POA: Diagnosis not present

## 2019-05-26 DIAGNOSIS — L814 Other melanin hyperpigmentation: Secondary | ICD-10-CM | POA: Diagnosis not present

## 2019-05-26 DIAGNOSIS — L820 Inflamed seborrheic keratosis: Secondary | ICD-10-CM

## 2019-05-26 DIAGNOSIS — C44719 Basal cell carcinoma of skin of left lower limb, including hip: Secondary | ICD-10-CM

## 2019-05-26 DIAGNOSIS — D229 Melanocytic nevi, unspecified: Secondary | ICD-10-CM

## 2019-05-26 DIAGNOSIS — D225 Melanocytic nevi of trunk: Secondary | ICD-10-CM

## 2019-05-26 DIAGNOSIS — Z85828 Personal history of other malignant neoplasm of skin: Secondary | ICD-10-CM

## 2019-05-26 DIAGNOSIS — L578 Other skin changes due to chronic exposure to nonionizing radiation: Secondary | ICD-10-CM

## 2019-05-26 DIAGNOSIS — D1800 Hemangioma unspecified site: Secondary | ICD-10-CM | POA: Diagnosis not present

## 2019-05-26 DIAGNOSIS — B079 Viral wart, unspecified: Secondary | ICD-10-CM

## 2019-05-26 DIAGNOSIS — D2272 Melanocytic nevi of left lower limb, including hip: Secondary | ICD-10-CM

## 2019-05-26 DIAGNOSIS — D485 Neoplasm of uncertain behavior of skin: Secondary | ICD-10-CM

## 2019-05-26 NOTE — Progress Notes (Signed)
Follow-Up Visit   Subjective  Vanessa Casey is a 69 y.o. female who presents for the following: warts (previous warts on the L lower eyelid and L cheek have resolved per pt), new skin lesions (R inner thigh and R neck - lesions that are irritating and patient picks at), and annual skin exam (pt has a history of BCC of the R jaw, L upper arm, and R upper lip, as well as a history of dysplastic nevus on the L breast which was severe).  The following portions of the chart were reviewed this encounter and updated as appropriate:     Review of Systems: No other skin or systemic complaints.  Objective  Well appearing patient in no apparent distress; mood and affect are within normal limits.  A full examination was performed including scalp, head, eyes, ears, nose, lips, neck, chest, axillae, abdomen, back, buttocks, bilateral upper extremities, bilateral lower extremities, hands, feet, fingers, toes, fingernails, and toenails. All findings within normal limits unless otherwise noted below.  Objective  face, trunk, extremities: Diffuse scaly erythematous macules with underlying dyspigmentation.   Objective  trunk, extremities: Red papules.   Objective  Left Upper Arm - Anterior, R jaw, R upper lip: Well healed scar with no evidence of recurrence.   Objective  Left Breast: Scar with no evidence of recurrence.   Objective  R neck x 1, L lower back x 1 (2): Erythematous keratotic or waxy stuck-on papule or plaque.   Objective  face, trunk, extremities: Scattered tan macules.   Objective  Right Thigh - Anterior: 0.6 cm scaly pink papule  Objective  L shin: 0.3 cm pink papule with pigment globules  Objective  face, trunk, extremities: Tan-brown and/or pink-flesh-colored symmetric macules and papules.   Left medial ankle: 0.5 cm regular brown papule  Objective  face, trunk, extremities: Stuck-on, waxy, tan-brown papule or plaque --Discussed benign etiology and prognosis.    Objective  Right Thigh - Anterior: Verrucous papule   Assessment & Plan  Actinic skin damage face, trunk, extremities  Recommend daily broad spectrum sunscreen SPF 30+ to sun-exposed areas, reapply every 2 hours as needed. Call for new or changing lesions. Recommend Nicotinamide 500mg  po BID given history of NMSC  Hemangioma, unspecified site trunk, extremities  Benign, observe.    History of basal cell carcinoma (BCC) (3) R jaw; R upper lip; Left Upper Arm - Anterior  No evidence of recurrence, call clinic for new or changing lesions.   History of dysplastic nevus Left Breast  No evidence of recurrence, call clinic for new or changing lesions.   Inflamed seborrheic keratosis (2) R neck x 1, L lower back x 1  Destruction of lesion - R neck x 1, L lower back x 1  Lentigines face, trunk, extremities  Benign, observe.    Neoplasm of uncertain behavior of skin (2) Right Thigh - Anterior  Skin / nail biopsy  Specimen 2 - Surgical pathology Differential Diagnosis: D48.5 r/o BCC Check Margins: No 0.6 cm scaly pink papule  L shin  Skin / nail biopsy Type of biopsy: tangential   Informed consent: discussed and consent obtained   Timeout: patient name, date of birth, surgical site, and procedure verified   Procedure prep:  Patient was prepped and draped in usual sterile fashion Prep type:  Isopropyl alcohol Anesthesia: the lesion was anesthetized in a standard fashion   Anesthetic:  1% lidocaine w/ epinephrine 1-100,000 buffered w/ 8.4% NaHCO3 Instrument used: flexible razor blade   Hemostasis achieved  with: aluminum chloride   Outcome: patient tolerated procedure well   Post-procedure details: sterile dressing applied and wound care instructions given   Dressing type: petrolatum    Specimen 1 - Surgical pathology Differential Diagnosis: D48.5 r/o BCC Check Margins: No 0.3 cm pink papule with pigment globules  Nevus (2) Left medial ankle; face, trunk,  extremities  Benign, observe.    Seborrheic keratosis face, trunk, extremities  Benign, observe.      Viral warts, unspecified type Right Thigh - Anterior  Destruction of lesion - Right Thigh - Anterior  Destruction method: cryotherapy   Lesion destroyed using liquid nitrogen: Yes   Outcome: patient tolerated procedure well with no complications    Xerosis cutis Right Abdomen (side) - Upper  Recommend Dove Sensitive Skin soaps, moisturizers daily with ammonium lactate (e.g. Amlactin) after showering to help break down scale.  Return for F/U appt. in 4-6 wks to recheck ISK's, bx sites, wart.   Luther Redo CMA, am acting as scribe for Forest Gleason, MD .  Documentation: I have reviewed the above documentation for accuracy and completeness, and I agree with the above.  Forest Gleason, MD

## 2019-05-26 NOTE — Patient Instructions (Signed)
Biopsy Wound Care Instructions  1. Leave the original bandage on for 24 hours if possible.  If the bandage becomes soaked or soiled before that time, it is OK to remove it and examine the wound.  A small amount of post-operative bleeding is normal.  If excessive bleeding occurs, remove the bandage, place gauze over the site and apply continuous pressure (no peeking) over the area for 30 minutes. If this does not work, please call our clinic as soon as possible or page your doctor if it is after hours.   2. Once a day, cleanse the wound with soap and water. It is fine to shower. If a thick crust develops you may use a Q-tip dipped into dilute hydrogen peroxide (mix 1:1 with water) to dissolve it.  Hydrogen peroxide can slow the healing process, so use it only as needed.    3. After washing, apply petroleum jelly (Vaseline) or an antibiotic ointment if your doctor prescribed one for you, followed by a bandage.    4. For best healing, the wound should be covered with a layer of ointment at all times. If you are not able to keep the area covered with a bandage to hold the ointment in place, this may mean re-applying the ointment several times a day.  Continue this wound care until the wound has healed and is no longer open.   Itching and mild discomfort is normal during the healing process. However, if you develop pain or severe itching, please call our office.   If you have any discomfort, you can take Tylenol (acetaminophen) or ibuprofen as directed on the bottle. (Please do not take these if you have an allergy to them or cannot take them for another reason).  Some redness, tenderness and white or yellow material in the wound is normal healing.  If the area becomes very sore and red, or develops a thick yellow-green material (pus), it may be infected; please notify us.    If you have stitches, return to clinic as directed to have the stitches removed. You will continue wound care for 2-3 days after  the stitches are removed.   Wound healing continues for up to one year following surgery. It is not unusual to experience pain in the scar from time to time during the interval.  If the pain becomes severe or the scar thickens, you should notify the office.    A slight amount of redness in a scar is expected for the first six months.  After six months, the redness will fade and the scar will soften and fade.  The color difference becomes less noticeable with time.  If there are any problems, return for a post-op surgery check at your earliest convenience.  To improve the appearance of the scar, you can use silicone scar gel, cream, or sheets (such as Mederma or Serica) every night for up to one year. These are available over the counter (without a prescription).  Please call our office for any questions or concerns.

## 2019-05-31 NOTE — Progress Notes (Signed)
1. Skin , left shin BASAL CELL CARCINOMA, NODULAR PATTERN --> recommend ED&C in clinic, can do at 4/22 appointment  2. Skin , right thigh anterior BASAL CELL CARCINOMA, NODULAR PATTERN --> recommend ED&C in clinic, can do at 4/22 appointment  MAs please call, let Dr. Jerilynn Mages know if pt has any questions. If she is agreeable, please lengthen 4/22 appointment to new pt/FBSE encounter length. Thank you!

## 2019-06-01 ENCOUNTER — Telehealth: Payer: Self-pay

## 2019-06-01 NOTE — Telephone Encounter (Signed)
Patient advised bx's BCC, will treat with EDC at follow up appt.

## 2019-06-01 NOTE — Telephone Encounter (Signed)
-----   Message from Alfonso Patten, MD sent at 05/31/2019  1:13 PM EDT ----- 1. Skin , left shin BASAL CELL CARCINOMA, NODULAR PATTERN --> recommend ED&C in clinic, can do at 4/22 appointment  2. Skin , right thigh anterior BASAL CELL CARCINOMA, NODULAR PATTERN --> recommend ED&C in clinic, can do at 4/22 appointment  MAs please call, let Dr. Jerilynn Mages know if pt has any questions. If she is agreeable, please lengthen 4/22 appointment to new pt/FBSE encounter length. Thank you!

## 2019-06-02 ENCOUNTER — Ambulatory Visit: Payer: PPO | Attending: Internal Medicine

## 2019-06-02 DIAGNOSIS — Z23 Encounter for immunization: Secondary | ICD-10-CM

## 2019-06-02 NOTE — Progress Notes (Signed)
   Covid-19 Vaccination Clinic  Name:  Shuwanda Ribordy    MRN: [id number] DOB: 1951/01/05  06/02/2019  Ms. Landeck was observed post Covid-19 immunization for 15 minutes without incident. She was provided with Vaccine Information Sheet and instruction to access the V-Safe system.   Ms. Noury was instructed to call 911 with any severe reactions post vaccine: Marland Kitchen Difficulty breathing  . Swelling of face and throat  . A fast heartbeat  . A bad rash all over body  . Dizziness and weakness   Immunizations Administered    Name Date Dose VIS Date Route   Pfizer COVID-19 Vaccine 06/02/2019  9:07 AM 0.3 mL 02/18/2019 Intramuscular   Manufacturer: [employer], [employer]   Lot: [id number]   [place]: [id number]

## 2019-06-03 ENCOUNTER — Ambulatory Visit: Payer: PPO | Admitting: Family Medicine

## 2019-06-13 ENCOUNTER — Other Ambulatory Visit: Payer: Self-pay

## 2019-06-13 ENCOUNTER — Ambulatory Visit (INDEPENDENT_AMBULATORY_CARE_PROVIDER_SITE_OTHER): Payer: PPO | Admitting: Family Medicine

## 2019-06-13 ENCOUNTER — Encounter: Payer: Self-pay | Admitting: Family Medicine

## 2019-06-13 VITALS — BP 148/74 | HR 56 | Temp 98.7°F | Wt 157.2 lb

## 2019-06-13 DIAGNOSIS — I10 Essential (primary) hypertension: Secondary | ICD-10-CM | POA: Diagnosis not present

## 2019-06-13 MED ORDER — LISINOPRIL 20 MG PO TABS: 20.0000 mg | ORAL_TABLET | Freq: Every day | ORAL | 3 refills | Status: DC

## 2019-06-13 NOTE — Assessment & Plan Note (Signed)
Still running a little high. Will increase her lisinopril to 20mg  and recheck 1 month with BMP. Call with any concerns. Continue to monitor.

## 2019-06-13 NOTE — Progress Notes (Signed)
BP (!) 148/74   Pulse (!) 56   Temp 98.7 F (37.1 C)   Wt 157 lb 4 oz (71.3 kg)   SpO2 98%   BMI 31.24 kg/m    Subjective:    Patient ID: Vanessa Casey, female    DOB: 1950/12/01, 69 y.o.   MRN: [id number]  HPI: Vanessa Casey is a 69 y.o. female  Chief Complaint  Patient presents with  . Hypertension   HYPERTENSION Hypertension status: better  Satisfied with current treatment? yes Duration of hypertension: 1 month BP monitoring frequency:  not checking BP medication side effects:  no Medication compliance: excellent compliance Previous BP meds: lisinopril Aspirin: no Recurrent headaches: no Visual changes: no Palpitations: no Dyspnea: no Chest pain: no Lower extremity edema: no Dizzy/lightheaded: no   Relevant past medical, surgical, family and social history reviewed and updated as indicated. Interim medical history since our last visit reviewed. Allergies and medications reviewed and updated.  Review of Systems  Constitutional: Negative.   Respiratory: Negative.   Cardiovascular: Negative.   Musculoskeletal: Negative.   Neurological: Negative.   Psychiatric/Behavioral: Negative.     Per HPI unless specifically indicated above     Objective:    BP (!) 148/74   Pulse (!) 56   Temp 98.7 F (37.1 C)   Wt 157 lb 4 oz (71.3 kg)   SpO2 98%   BMI 31.24 kg/m   Wt Readings from Last 3 Encounters:  06/13/19 157 lb 4 oz (71.3 kg)  03/17/19 152 lb 6 oz (69.1 kg)  03/15/18 161 lb (73 kg)    Physical Exam Vitals and nursing note reviewed.  Constitutional:      General: She is not in acute distress.    Appearance: Normal appearance. She is not ill-appearing, toxic-appearing or diaphoretic.  HENT:     Head: Normocephalic and atraumatic.     Right Ear: External ear normal.     Left Ear: External ear normal.     Nose: Nose normal.     Mouth/Throat:     Mouth: Mucous membranes are moist.     Pharynx: Oropharynx is clear.  Eyes:     General: No scleral  icterus.       Right eye: No discharge.        Left eye: No discharge.     Extraocular Movements: Extraocular movements intact.     Conjunctiva/sclera: Conjunctivae normal.     Pupils: Pupils are equal, round, and reactive to light.  Cardiovascular:     Rate and Rhythm: Normal rate and regular rhythm.     Pulses: Normal pulses.     Heart sounds: Normal heart sounds. No murmur. No friction rub. No gallop.   Pulmonary:     Effort: Pulmonary effort is normal. No respiratory distress.     Breath sounds: Normal breath sounds. No stridor. No wheezing, rhonchi or rales.  Chest:     Chest wall: No tenderness.  Musculoskeletal:        General: Normal range of motion.     Cervical back: Normal range of motion and neck supple.  Skin:    General: Skin is warm and dry.     Capillary Refill: Capillary refill takes less than 2 seconds.     Coloration: Skin is not jaundiced or pale.     Findings: No bruising, erythema, lesion or rash.  Neurological:     General: No focal deficit present.     Mental Status: She is alert and oriented to  person, place, and time. Mental status is at baseline.  Psychiatric:        Mood and Affect: Mood normal.        Behavior: Behavior normal.        Thought Content: Thought content normal.        Judgment: Judgment normal.     Results for orders placed or performed in visit on 03/17/19  Microscopic Examination   URINE  Result Value Ref Range   WBC, UA None seen 0 - 5 /hpf   RBC 3-10 (A) 0 - 2 /hpf   Epithelial Cells (non renal) 0-10 0 - 10 /hpf   Casts Present None seen /lpf   Cast Type Hyaline casts N/A   Mucus, UA Present Not Estab.   Bacteria, UA None seen None seen/Few  CBC with Differential OUT  Result Value Ref Range   WBC 5.3 3.4 - 10.8 x10E3/uL   RBC 4.36 3.77 - 5.28 x10E6/uL   Hemoglobin 11.8 11.1 - 15.9 g/dL   Hematocrit 37.0 34.0 - 46.6 %   MCV 85 79 - 97 fL   MCH 27.1 26.6 - 33.0 pg   MCHC 31.9 31.5 - 35.7 g/dL   RDW 13.9 11.7 - 15.4 %     Platelets 238 150 - 450 x10E3/uL   Neutrophils 61 Not Estab. %   Lymphs 29 Not Estab. %   Monocytes 8 Not Estab. %   Eos 1 Not Estab. %   Basos 1 Not Estab. %   Neutrophils Absolute 3.2 1.4 - 7.0 x10E3/uL   Lymphocytes Absolute 1.5 0.7 - 3.1 x10E3/uL   Monocytes Absolute 0.4 0.1 - 0.9 x10E3/uL   EOS (ABSOLUTE) 0.0 0.0 - 0.4 x10E3/uL   Basophils Absolute 0.0 0.0 - 0.2 x10E3/uL   Immature Granulocytes 0 Not Estab. %   Immature Grans (Abs) 0.0 0.0 - 0.1 x10E3/uL  Comp Met (CMET)  Result Value Ref Range   Glucose 74 65 - 99 mg/dL   BUN 17 8 - 27 mg/dL   Creatinine, Ser 0.73 0.57 - 1.00 mg/dL   GFR calc non Af Amer 85 >59 mL/min/1.73   GFR calc Af Amer 99 >59 mL/min/1.73   BUN/Creatinine Ratio 23 12 - 28   Sodium 141 134 - 144 mmol/L   Potassium 4.1 3.5 - 5.2 mmol/L   Chloride 103 96 - 106 mmol/L   CO2 23 20 - 29 mmol/L   Calcium 9.2 8.7 - 10.3 mg/dL   Total Protein 7.0 6.0 - 8.5 g/dL   Albumin 4.1 3.8 - 4.8 g/dL   Globulin, Total 2.9 1.5 - 4.5 g/dL   Albumin/Globulin Ratio 1.4 1.2 - 2.2   Bilirubin Total <0.2 0.0 - 1.2 mg/dL   Alkaline Phosphatase 95 39 - 117 IU/L   AST 16 0 - 40 IU/L   ALT 10 0 - 32 IU/L  Lipid Panel w/o Chol/HDL Ratio OUT  Result Value Ref Range   Cholesterol, Total 240 (H) 100 - 199 mg/dL   Triglycerides 226 (H) 0 - 149 mg/dL   HDL 52 >39 mg/dL   VLDL Cholesterol Cal 41 (H) 5 - 40 mg/dL   LDL Chol Calc (NIH) 147 (H) 0 - 99 mg/dL  TSH  Result Value Ref Range   TSH 2.330 0.450 - 4.500 uIU/mL  UA/M w/rflx Culture, Routine   Specimen: Urine   URINE  Result Value Ref Range   Specific Gravity, UA >1.030 (H) 1.005 - 1.030   pH, UA 5.0 5.0 - 7.5  Color, UA Yellow Yellow   Appearance Ur Clear Clear   Leukocytes,UA Negative Negative   Protein,UA Negative Negative/Trace   Glucose, UA Negative Negative   Ketones, UA Negative Negative   RBC, UA 2+ (A) Negative   Bilirubin, UA Negative Negative   Urobilinogen, Ur 0.2 0.2 - 1.0 mg/dL   Nitrite, UA  Negative Negative   Microscopic Examination See below:       Assessment & Plan:   Problem List Items Addressed This Visit      Cardiovascular and Mediastinum   HTN (hypertension) - Primary    Still running a little high. Will increase her lisinopril to 54m and recheck 1 month with BMP. Call with any concerns. Continue to monitor.       Relevant Medications   lisinopril (ZESTRIL) 20 MG tablet       Follow up plan: Return in about 4 weeks (around 07/11/2019).

## 2019-06-30 ENCOUNTER — Ambulatory Visit: Payer: Self-pay | Admitting: Dermatology

## 2019-07-14 ENCOUNTER — Encounter: Payer: Self-pay | Admitting: Family Medicine

## 2019-07-14 ENCOUNTER — Other Ambulatory Visit: Payer: Self-pay

## 2019-07-14 ENCOUNTER — Ambulatory Visit (INDEPENDENT_AMBULATORY_CARE_PROVIDER_SITE_OTHER): Payer: PPO | Admitting: Family Medicine

## 2019-07-14 VITALS — BP 137/78 | HR 57 | Temp 98.1°F | Ht 60.24 in | Wt 158.2 lb

## 2019-07-14 DIAGNOSIS — I10 Essential (primary) hypertension: Secondary | ICD-10-CM

## 2019-07-14 MED ORDER — LISINOPRIL 20 MG PO TABS: 20.0000 mg | ORAL_TABLET | Freq: Every day | ORAL | 1 refills | Status: DC

## 2019-07-14 NOTE — Progress Notes (Signed)
BP 137/78   Pulse (!) 57   Temp 98.1 F (36.7 C) (Oral)   Ht 5' 0.24" (1.53 m)   Wt 158 lb 3.2 oz (71.8 kg)   SpO2 98%   BMI 30.65 kg/m    Subjective:    Patient ID: Vanessa Casey, female    DOB: 29-Jul-1950, 69 y.o.   MRN: [id number]  HPI: Vanessa Casey is a 69 y.o. female  Chief Complaint  Patient presents with  . Hypertension   HYPERTENSION Hypertension status: controlled  Satisfied with current treatment? yes Duration of hypertension: chronic BP monitoring frequency:  not checking BP medication side effects:  no Medication compliance: excellent compliance Previous BP meds: lisinopril Aspirin: no Recurrent headaches: no Visual changes: no Palpitations: no Dyspnea: no Chest pain: no Lower extremity edema: no Dizzy/lightheaded: no  Relevant past medical, surgical, family and social history reviewed and updated as indicated. Interim medical history since our last visit reviewed. Allergies and medications reviewed and updated.  Review of Systems  Constitutional: Negative.   Respiratory: Negative.   Cardiovascular: Negative.   Gastrointestinal: Negative.   Psychiatric/Behavioral: Negative.     Per HPI unless specifically indicated above     Objective:    BP 137/78   Pulse (!) 57   Temp 98.1 F (36.7 C) (Oral)   Ht 5' 0.24" (1.53 m)   Wt 158 lb 3.2 oz (71.8 kg)   SpO2 98%   BMI 30.65 kg/m   Wt Readings from Last 3 Encounters:  07/14/19 158 lb 3.2 oz (71.8 kg)  06/13/19 157 lb 4 oz (71.3 kg)  03/17/19 152 lb 6 oz (69.1 kg)    Physical Exam Vitals and nursing note reviewed.  Constitutional:      General: She is not in acute distress.    Appearance: Normal appearance. She is not ill-appearing, toxic-appearing or diaphoretic.  HENT:     Head: Normocephalic and atraumatic.     Right Ear: External ear normal.     Left Ear: External ear normal.     Nose: Nose normal.     Mouth/Throat:     Mouth: Mucous membranes are moist.     Pharynx: Oropharynx  is clear.  Eyes:     General: No scleral icterus.       Right eye: No discharge.        Left eye: No discharge.     Extraocular Movements: Extraocular movements intact.     Conjunctiva/sclera: Conjunctivae normal.     Pupils: Pupils are equal, round, and reactive to light.  Cardiovascular:     Rate and Rhythm: Normal rate and regular rhythm.     Pulses: Normal pulses.     Heart sounds: Normal heart sounds. No murmur. No friction rub. No gallop.   Pulmonary:     Effort: Pulmonary effort is normal. No respiratory distress.     Breath sounds: Normal breath sounds. No stridor. No wheezing, rhonchi or rales.  Chest:     Chest wall: No tenderness.  Musculoskeletal:        General: Normal range of motion.     Cervical back: Normal range of motion and neck supple.  Skin:    General: Skin is warm and dry.     Capillary Refill: Capillary refill takes less than 2 seconds.     Coloration: Skin is not jaundiced or pale.     Findings: No bruising, erythema, lesion or rash.  Neurological:     General: No focal deficit present.  Mental Status: She is alert and oriented to person, place, and time. Mental status is at baseline.  Psychiatric:        Mood and Affect: Mood normal.        Behavior: Behavior normal.        Thought Content: Thought content normal.        Judgment: Judgment normal.     Results for orders placed or performed in visit on 03/17/19  Microscopic Examination   URINE  Result Value Ref Range   WBC, UA None seen 0 - 5 /hpf   RBC 3-10 (A) 0 - 2 /hpf   Epithelial Cells (non renal) 0-10 0 - 10 /hpf   Casts Present None seen /lpf   Cast Type Hyaline casts N/A   Mucus, UA Present Not Estab.   Bacteria, UA None seen None seen/Few  CBC with Differential OUT  Result Value Ref Range   WBC 5.3 3.4 - 10.8 x10E3/uL   RBC 4.36 3.77 - 5.28 x10E6/uL   Hemoglobin 11.8 11.1 - 15.9 g/dL   Hematocrit 37.0 34.0 - 46.6 %   MCV 85 79 - 97 fL   MCH 27.1 26.6 - 33.0 pg   MCHC 31.9  31.5 - 35.7 g/dL   RDW 13.9 11.7 - 15.4 %   Platelets 238 150 - 450 x10E3/uL   Neutrophils 61 Not Estab. %   Lymphs 29 Not Estab. %   Monocytes 8 Not Estab. %   Eos 1 Not Estab. %   Basos 1 Not Estab. %   Neutrophils Absolute 3.2 1.4 - 7.0 x10E3/uL   Lymphocytes Absolute 1.5 0.7 - 3.1 x10E3/uL   Monocytes Absolute 0.4 0.1 - 0.9 x10E3/uL   EOS (ABSOLUTE) 0.0 0.0 - 0.4 x10E3/uL   Basophils Absolute 0.0 0.0 - 0.2 x10E3/uL   Immature Granulocytes 0 Not Estab. %   Immature Grans (Abs) 0.0 0.0 - 0.1 x10E3/uL  Comp Met (CMET)  Result Value Ref Range   Glucose 74 65 - 99 mg/dL   BUN 17 8 - 27 mg/dL   Creatinine, Ser 0.73 0.57 - 1.00 mg/dL   GFR calc non Af Amer 85 >59 mL/min/1.73   GFR calc Af Amer 99 >59 mL/min/1.73   BUN/Creatinine Ratio 23 12 - 28   Sodium 141 134 - 144 mmol/L   Potassium 4.1 3.5 - 5.2 mmol/L   Chloride 103 96 - 106 mmol/L   CO2 23 20 - 29 mmol/L   Calcium 9.2 8.7 - 10.3 mg/dL   Total Protein 7.0 6.0 - 8.5 g/dL   Albumin 4.1 3.8 - 4.8 g/dL   Globulin, Total 2.9 1.5 - 4.5 g/dL   Albumin/Globulin Ratio 1.4 1.2 - 2.2   Bilirubin Total <0.2 0.0 - 1.2 mg/dL   Alkaline Phosphatase 95 39 - 117 IU/L   AST 16 0 - 40 IU/L   ALT 10 0 - 32 IU/L  Lipid Panel w/o Chol/HDL Ratio OUT  Result Value Ref Range   Cholesterol, Total 240 (H) 100 - 199 mg/dL   Triglycerides 226 (H) 0 - 149 mg/dL   HDL 52 >39 mg/dL   VLDL Cholesterol Cal 41 (H) 5 - 40 mg/dL   LDL Chol Calc (NIH) 147 (H) 0 - 99 mg/dL  TSH  Result Value Ref Range   TSH 2.330 0.450 - 4.500 uIU/mL  UA/M w/rflx Culture, Routine   Specimen: Urine   URINE  Result Value Ref Range   Specific Gravity, UA >1.030 (H) 1.005 - 1.030  pH, UA 5.0 5.0 - 7.5   Color, UA Yellow Yellow   Appearance Ur Clear Clear   Leukocytes,UA Negative Negative   Protein,UA Negative Negative/Trace   Glucose, UA Negative Negative   Ketones, UA Negative Negative   RBC, UA 2+ (A) Negative   Bilirubin, UA Negative Negative   Urobilinogen,  Ur 0.2 0.2 - 1.0 mg/dL   Nitrite, UA Negative Negative   Microscopic Examination See below:       Assessment & Plan:   Problem List Items Addressed This Visit      Cardiovascular and Mediastinum   HTN (hypertension) - Primary    Under good control on current regimen. Continue current regimen. Continue to monitor. Call with any concerns. Refills given. Labs drawn today.       Relevant Medications   lisinopril (ZESTRIL) 20 MG tablet   Other Relevant Orders   Basic metabolic panel       Follow up plan: Return in about 6 months (around 01/14/2020).

## 2019-07-14 NOTE — Assessment & Plan Note (Signed)
Under good control on current regimen. Continue current regimen. Continue to monitor. Call with any concerns. Refills given. Labs drawn today.   

## 2019-07-15 LAB — BASIC METABOLIC PANEL
BUN/Creatinine Ratio: 21 (ref 12–28)
BUN: 16 mg/dL (ref 8–27)
CO2: 22 mmol/L (ref 20–29)
Calcium: 9.2 mg/dL (ref 8.7–10.3)
Chloride: 104 mmol/L (ref 96–106)
Creatinine, Ser: 0.75 mg/dL (ref 0.57–1.00)
GFR calc Af Amer: 95 mL/min/{1.73_m2} (ref >59)
GFR calc non Af Amer: 83 mL/min/{1.73_m2} (ref >59)
Glucose: 76 mg/dL (ref 65–99)
Potassium: 4.1 mmol/L (ref 3.5–5.2)
Sodium: 142 mmol/L (ref 134–144)

## 2019-07-18 ENCOUNTER — Other Ambulatory Visit: Payer: Self-pay | Admitting: Family Medicine

## 2019-09-08 ENCOUNTER — Other Ambulatory Visit: Payer: Self-pay | Admitting: Family Medicine

## 2019-10-21 DIAGNOSIS — H43813 Vitreous degeneration, bilateral: Secondary | ICD-10-CM | POA: Diagnosis not present

## 2019-12-16 ENCOUNTER — Ambulatory Visit: Payer: Self-pay | Admitting: *Deleted

## 2019-12-16 NOTE — Telephone Encounter (Signed)
Noted  

## 2019-12-16 NOTE — Telephone Encounter (Signed)
Per initial encounter "Patient is calling to request a call back. Is it recommended to have flu shot or COVID booster first? Please advise"; contacted pt to discuss; pt given recommendation, per [facility], she can have both injections in 2 separate syringes at the same time; the pt verbalized understanding and states she will probably get her flu shot firs; the pt is seen byDr Park Liter, [facility]; will route to office for notification.   Reason for Disposition  General information question, no triage required and triager able to answer question  Answer Assessment - Initial Assessment Questions 1. REASON FOR CALL or QUESTION: "What is your reason for calling today?" or "How can I best help you?" or "What question do you have that I can help answer?"     "Is it recommended to have flu shot or COVID booster first?"  Protocols used: [place]

## 2020-01-17 ENCOUNTER — Other Ambulatory Visit: Payer: Self-pay | Admitting: Family Medicine

## 2020-01-17 ENCOUNTER — Ambulatory Visit: Payer: PPO | Admitting: Family Medicine

## 2020-01-17 ENCOUNTER — Encounter: Payer: Self-pay | Admitting: Family Medicine

## 2020-01-17 ENCOUNTER — Ambulatory Visit (INDEPENDENT_AMBULATORY_CARE_PROVIDER_SITE_OTHER): Payer: PPO | Admitting: Family Medicine

## 2020-01-17 VITALS — BP 121/78 | Wt 145.0 lb

## 2020-01-17 DIAGNOSIS — I10 Essential (primary) hypertension: Secondary | ICD-10-CM | POA: Diagnosis not present

## 2020-01-17 MED ORDER — LISINOPRIL 20 MG PO TABS: 20.0000 mg | ORAL_TABLET | Freq: Every day | ORAL | 1 refills | Status: DC

## 2020-01-17 NOTE — Telephone Encounter (Signed)
Virtual scheduled for tomorrow

## 2020-01-17 NOTE — Assessment & Plan Note (Signed)
Under good control on current regimen. Continue current regimen. Continue to monitor. Call with any concerns. Refills given. Labs to be done shortly. Await results.

## 2020-01-17 NOTE — Telephone Encounter (Signed)
Requested medications are due for refill today? Yes  Requested medications are on active medication list?  Yes  Last Refill:   07/14/2019  # 90 with one refill   Future visit scheduled?   No   Notes to Clinic:  Medication failed Rx refill protocol due to no valid encounter within the past 6 months and no labs within the past 180 days.

## 2020-01-17 NOTE — Telephone Encounter (Signed)
Patient requires an appt.

## 2020-01-17 NOTE — Progress Notes (Signed)
BP 121/78   Wt 145 lb (65.8 kg)   BMI 28.10 kg/m    Subjective:    Patient ID: Vanessa Casey, female    DOB: Feb 19, 1951, 69 y.o.   MRN: [id number]  HPI: Vanessa Casey is a 69 y.o. female  Chief Complaint  Patient presents with  . Hypertension   HYPERTENSION Hypertension status: controlled  Satisfied with current treatment? yes Duration of hypertension: chronic BP monitoring frequency:  a few times a month BP medication side effects:  no Medication compliance: excellent compliance Previous BP meds: lisinopril Aspirin: no Recurrent headaches: no Visual changes: no Palpitations: no Dyspnea: no Chest pain: no Lower extremity edema: no Dizzy/lightheaded: no  Relevant past medical, surgical, family and social history reviewed and updated as indicated. Interim medical history since our last visit reviewed. Allergies and medications reviewed and updated.  Review of Systems  Constitutional: Negative.   Respiratory: Negative.   Cardiovascular: Negative.   Musculoskeletal: Negative.   Psychiatric/Behavioral: Negative.     Per HPI unless specifically indicated above     Objective:    BP 121/78   Wt 145 lb (65.8 kg)   BMI 28.10 kg/m   Wt Readings from Last 3 Encounters:  01/17/20 145 lb (65.8 kg)  07/14/19 158 lb 3.2 oz (71.8 kg)  06/13/19 157 lb 4 oz (71.3 kg)    Physical Exam Vitals and nursing note reviewed.  Pulmonary:     Effort: Pulmonary effort is normal. No respiratory distress.     Comments: Speaking in full sentences Neurological:     Mental Status: She is alert.  Psychiatric:        Mood and Affect: Mood normal.        Behavior: Behavior normal.        Thought Content: Thought content normal.        Judgment: Judgment normal.     Results for orders placed or performed in visit on 56/81/27  Basic metabolic panel  Result Value Ref Range   Glucose 76 65 - 99 mg/dL   BUN 16 8 - 27 mg/dL   Creatinine, Ser 0.75 0.57 - 1.00 mg/dL   GFR calc non  Af Amer 83 >59 mL/min/1.73   GFR calc Af Amer 95 >59 mL/min/1.73   BUN/Creatinine Ratio 21 12 - 28   Sodium 142 134 - 144 mmol/L   Potassium 4.1 3.5 - 5.2 mmol/L   Chloride 104 96 - 106 mmol/L   CO2 22 20 - 29 mmol/L   Calcium 9.2 8.7 - 10.3 mg/dL      Assessment & Plan:   Problem List Items Addressed This Visit      Cardiovascular and Mediastinum   HTN (hypertension)    Under good control on current regimen. Continue current regimen. Continue to monitor. Call with any concerns. Refills given. Labs to be done shortly. Await results.         Other Visit Diagnoses    Essential hypertension    -  Primary   Relevant Orders   Basic metabolic panel       Follow up plan: Return in about 6 months (around 07/16/2020) for physical.   . This visit was completed via telephone due to the restrictions of the COVID-19 pandemic. All issues as above were discussed and addressed but no physical exam was performed. If it was felt that the patient should be evaluated in the office, they were directed there. The patient verbally consented to this visit. Patient was unable to complete  an audio/visual visit due to Lack of equipment. Due to the catastrophic nature of the COVID-19 pandemic, this visit was done through audio contact only. . Location of the patient: home . Location of the provider: work . Those involved with this call:  . Provider: Park Liter, DO . CMA: Louanna Raw . Front Desk/Registration: Jill Side  . Time spent on call: 21 minutes on the phone discussing health concerns. 23 minutes total spent in review of patient's record and preparation of their chart.

## 2020-04-18 ENCOUNTER — Other Ambulatory Visit: Payer: Self-pay | Admitting: Family Medicine

## 2020-04-26 ENCOUNTER — Ambulatory Visit: Payer: PPO

## 2020-04-27 ENCOUNTER — Ambulatory Visit (INDEPENDENT_AMBULATORY_CARE_PROVIDER_SITE_OTHER): Payer: PPO

## 2020-04-27 VITALS — Ht 61.0 in | Wt 140.0 lb

## 2020-04-27 DIAGNOSIS — Z0000 Encounter for general adult medical examination without abnormal findings: Secondary | ICD-10-CM

## 2020-04-27 NOTE — Progress Notes (Signed)
I connected with Vanessa Casey today by telephone and verified that I am speaking with the correct person using two identifiers. Location patient: home Location provider: work Persons participating in the virtual visit: Clinton Wahlberg, Glenna Durand LPN.   I discussed the limitations, risks, security and privacy concerns of performing an evaluation and management service by telephone and the availability of in person appointments. I also discussed with the patient that there may be a patient responsible charge related to this service. The patient expressed understanding and verbally consented to this telephonic visit.    Interactive audio and video telecommunications were attempted between this provider and patient, however failed, due to patient having technical difficulties OR patient did not have access to video capability.  We continued and completed visit with audio only.     Vital signs may be patient reported or missing.  Subjective:   Vanessa Casey is a 70 y.o. female who presents for Medicare Annual (Subsequent) preventive examination.  Review of Systems     Cardiac Risk Factors include: advanced age (>33men, >60 women);hypertension     Objective:    Today's Vitals   04/27/20 1341  Weight: 140 lb (63.5 kg)  Height: 5\' 1"  (1.549 m)   Body mass index is 26.45 kg/m.  Advanced Directives 04/27/2020 04/21/2019 03/03/2018 01/14/2018 06/09/2017 05/05/2017 11/08/2014  Does Patient Have a Medical Advance Directive? No No No No No No No  Would patient like information on creating a medical advance directive? - - No - Patient declined Yes (MAU/Ambulatory/Procedural Areas - Information given) No - Patient declined - Yes - Educational materials given    Current Medications (verified) Outpatient Encounter Medications as of 04/27/2020  Medication Sig  . ibuprofen (ADVIL) 600 MG tablet TAKE 1 TABLET BY MOUTH EVERY 8 HOURS AS NEEDED FOR HEADACHE AND MILD PAIN  . lisinopril (ZESTRIL) 20 MG  tablet Take 1 tablet (20 mg total) by mouth daily.   No facility-administered encounter medications on file as of 04/27/2020.    Allergies (verified) Patient has no known allergies.   History: Past Medical History:  Diagnosis Date  . Acute cholecystitis 03/04/2018  . Atypical mole 07/30/2017   left breast/excision  . Basal cell carcinoma 07/30/2017   right jaw  . Basal cell carcinoma 09/16/2017   left upper arm/excision  . Basal cell carcinoma 11/22/2018   right upper cutaneous lip  . Edema    FEET/ LEGS  . Medical history non-contributory   . Post-operative nausea and vomiting 04/30/2017  . Shingles    Past Surgical History:  Procedure Laterality Date  . ABDOMINAL HYSTERECTOMY    . BASAL CELL CARCINOMA EXCISION    . CATARACT EXTRACTION W/PHACO Right 05/05/2017   Procedure: CATARACT EXTRACTION PHACO AND INTRAOCULAR LENS PLACEMENT (IOC);  Surgeon: Birder Robson, MD;  Location: [facility] ORS;  Service: Ophthalmology;  Laterality: Right;  [place] 00:33.2 AP% 15.8 CDE 5.25 Fluid Pack Lot # [id number]  . CATARACT EXTRACTION W/PHACO Left 06/09/2017   Procedure: CATARACT EXTRACTION PHACO AND INTRAOCULAR LENS PLACEMENT ([place]);  Surgeon: Birder Robson, MD;  Location: [facility] ORS;  Service: Ophthalmology;  Laterality: Left;  [place] 01:02.2 AP% 15.2 CDE 9.48 Fluid Pack Lot # [id number]  . CHOLECYSTECTOMY N/A 03/04/2018   Procedure: LAPAROSCOPIC CHOLECYSTECTOMY;  Surgeon: Jules Husbands, MD;  Location: [facility] ORS;  Service: General;  Laterality: N/A;   Family History  Problem Relation Age of Onset  . Dementia Mother   . Heart failure Father   . Heart attack Sister   . Heart  attack Brother   . Breast cancer Maternal Aunt    Social History   Socioeconomic History  . Marital status: Single    Spouse name: Not on file  . Number of children: Not on file  . Years of education: Not on file  . Highest education level: Not on file  Occupational History  . Not on file  Tobacco Use  . Smoking  status: Never Smoker  . Smokeless tobacco: Never Used  Vaping Use  . Vaping Use: Never used  Substance and Sexual Activity  . Alcohol use: No  . Drug use: No  . Sexual activity: Not Currently    Birth control/protection: Post-menopausal  Other Topics Concern  . Not on file  Social History Narrative  . Not on file   Social Determinants of Health   Financial Resource Strain: Low Risk   . Difficulty of Paying Living Expenses: Not hard at all  Food Insecurity: No Food Insecurity  . Worried About Charity fundraiser in the Last Year: Never true  . Ran Out of Food in the Last Year: Never true  Transportation Needs: No Transportation Needs  . Lack of Transportation (Medical): No  . Lack of Transportation (Non-Medical): No  Physical Activity: Sufficiently Active  . Days of Exercise per Week: 7 days  . Minutes of Exercise per Session: 30 min  Stress: No Stress Concern Present  . Feeling of Stress : Not at all  Social Connections: Not on file    Tobacco Counseling Counseling given: Not Answered   Clinical Intake:  Pre-visit preparation completed: Yes  Pain : No/denies pain     Nutritional Status: BMI 25 -29 Overweight Nutritional Risks: None Diabetes: No  How often do you need to have someone help you when you read instructions, pamphlets, or other written materials from your doctor or pharmacy?: 1 - Never What is the last grade level you completed in school?: 10th grade  Diabetic? no  Interpreter Needed?: No  Information entered by :: NAllen LPN   Activities of Daily Living In your present state of health, do you have any difficulty performing the following activities: 04/27/2020  Hearing? N  Vision? N  Difficulty concentrating or making decisions? N  Walking or climbing stairs? N  Dressing or bathing? N  Doing errands, shopping? N  Preparing Food and eating ? N  Using the Toilet? N  In the past six months, have you accidently leaked urine? N  Do you have  problems with loss of bowel control? N  Managing your Medications? N  Managing your Finances? N  Housekeeping or managing your Housekeeping? N  Some recent data might be hidden    Patient Care Team: Valerie Roys, DO as PCP - General (Family Medicine) Anell Barr, OD (Optometry)  Indicate any recent Medical Services you may have received from other than [facility] providers in the past year (date may be approximate).     Assessment:   This is a routine wellness examination for Vanessa Casey.  Hearing/Vision screen No exam data present  Dietary issues and exercise activities discussed: Current Exercise Habits: Home exercise routine, Type of exercise: walking, Time (Minutes): 30, Frequency (Times/Week): 7, Weekly Exercise (Minutes/Week): 210  Goals    . Patient Stated     04/27/2020, stay healthy      Depression Screen PHQ 2/9 Scores 04/27/2020 04/21/2019 03/17/2019 01/14/2018 01/14/2018 01/12/2017  PHQ - 2 Score 0 0 0 0 0 0  PHQ- 9 Score - - - -  0 -    Fall Risk Fall Risk  04/27/2020 03/17/2019 03/11/2018 01/14/2018 01/12/2017  Falls in the past year? 0 0 0 0 No  Number falls in past yr: - 0 - 1 -  Injury with Fall? - 0 - 0 -  Risk for fall due to : Medication side effect - - - -  Follow up Falls evaluation completed;Education provided;Falls prevention discussed - - - -    FALL RISK PREVENTION PERTAINING TO THE HOME:  Any stairs in or around the home? Yes  If so, are there any without handrails? Yes  Home free of loose throw rugs in walkways, pet beds, electrical cords, etc? Yes  Adequate lighting in your home to reduce risk of falls? Yes   ASSISTIVE DEVICES UTILIZED TO PREVENT FALLS:  Life alert? No  Use of a cane, walker or w/c? No  Grab bars in the bathroom? No  Shower chair or bench in shower? No  Elevated toilet seat or a handicapped toilet? No   TIMED UP AND GO:  Was the test performed? No .    Cognitive Function:     6CIT Screen 04/27/2020 03/17/2019 01/14/2018 01/12/2017   What Year? 0 points 0 points 0 points 0 points  What month? 0 points 0 points 0 points 0 points  What time? 0 points 0 points 0 points 0 points  Count back from 20 0 points 2 points 0 points 0 points  Months in reverse 0 points 0 points 0 points 0 points  Repeat phrase 0 points 2 points 0 points 2 points  Total Score 0 4 0 2    Immunizations Immunization History  Administered Date(s) Administered  . Fluad Quad(high Dose 65+) 12/03/2018  . Influenza, High Dose Seasonal PF 12/16/2017  . Influenza,inj,Quad PF,6+ Mos 12/11/2016  . PFIZER(Purple Top)SARS-COV-2 Vaccination 05/12/2019, 06/02/2019  . Pneumococcal Conjugate-13 01/12/2017  . Pneumococcal Polysaccharide-23 01/14/2018  . Td 11/08/2014    TDAP status: Up to date  Flu Vaccine status: Up to date  Pneumococcal vaccine status: Up to date  Covid-19 vaccine status: Completed vaccines  Qualifies for Shingles Vaccine? Yes   Zostavax completed No   Shingrix Completed?: No.    Education has been provided regarding the importance of this vaccine. Patient has been advised to call insurance company to determine out of pocket expense if they have not yet received this vaccine. Advised may also receive vaccine at local pharmacy or Health Dept. Verbalized acceptance and understanding.  Screening Tests Health Maintenance  Topic Date Due  . COVID-19 Vaccine (3 - Pfizer risk 4-dose series) 06/30/2019  . Fecal DNA (Cologuard)  01/14/2020  . MAMMOGRAM  04/06/2020  . INFLUENZA VACCINE  06/07/2020 (Originally 10/09/2019)  . TETANUS/TDAP  11/07/2024  . DEXA SCAN  Completed  . Hepatitis C Screening  Completed  . PNA vac Low Risk Adult  Completed    Health Maintenance  Health Maintenance Due  Topic Date Due  . COVID-19 Vaccine (3 - Pfizer risk 4-dose series) 06/30/2019  . Fecal DNA (Cologuard)  01/14/2020  . MAMMOGRAM  04/06/2020    Colorectal cancer screening: Type of screening: Cologuard. Completed 01/13/2017. Repeat every 3  years  Mammogram status: patient to schedule  Bone Density status: Completed 04/06/2018.  Lung Cancer Screening: (Low Dose CT Chest recommended if Age 68-80 years, 30 pack-year currently smoking OR have quit w/in 15years.) does not qualify.   Lung Cancer Screening Referral: no  Additional Screening:  Hepatitis C Screening: does qualify; Completed 12/11/2016  Vision Screening: Recommended annual ophthalmology exams for early detection of glaucoma and other disorders of the eye. Is the patient up to date with their annual eye exam?  Yes  Who is the provider or what is the name of the office in which the patient attends annual eye exams? [facility] If pt is not established with a provider, would they like to be referred to a provider to establish care? No .   Dental Screening: Recommended annual dental exams for proper oral hygiene  Community Resource Referral / Chronic Care Management: CRR required this visit?  No   CCM required this visit?  No      Plan:     I have personally reviewed and noted the following in the patient's chart:   . Medical and social history . Use of alcohol, tobacco or illicit drugs  . Current medications and supplements . Functional ability and status . Nutritional status . Physical activity . Advanced directives . List of other physicians . Hospitalizations, surgeries, and ER visits in previous 12 months . Vitals . Screenings to include cognitive, depression, and falls . Referrals and appointments  In addition, I have reviewed and discussed with patient certain preventive protocols, quality metrics, and best practice recommendations. A written personalized care plan for preventive services as well as general preventive health recommendations were provided to patient.     Kellie Simmering, LPN   7/74/1287   Nurse Notes:

## 2020-04-27 NOTE — Patient Instructions (Signed)
[name] , Thank you for taking time to come for your Medicare Wellness Visit. I appreciate your ongoing commitment to your health goals. Please review the following plan we discussed and let me know if I can assist you in the future.   Screening recommendations/referrals: Colonoscopy: cologuard is due Mammogram: patient to schedule Bone Density:  Completed 04/06/2018 Recommended yearly ophthalmology/optometry visit for glaucoma screening and checkup Recommended yearly dental visit for hygiene and checkup  Vaccinations: Influenza vaccine: completed 01/18/2020, due 10/08/2020 Pneumococcal vaccine: completed 01/14/2018 Tdap vaccine: completed 11/08/2014, due 11/07/2024 Shingles vaccine: discussed   Covid-19: 06/02/2019, 05/12/2019  Advanced directives: Advance directive discussed with you today.   Conditions/risks identified: none  Next appointment: Follow up in one year for your annual wellness visit    Preventive Care 65 Years and Older, Female Preventive care refers to lifestyle choices and visits with your health care provider that can promote health and wellness. What does preventive care include?  A yearly physical exam. This is also called an annual well check.  Dental exams once or twice a year.  Routine eye exams. Ask your health care provider how often you should have your eyes checked.  Personal lifestyle choices, including:  Daily care of your teeth and gums.  Regular physical activity.  Eating a healthy diet.  Avoiding tobacco and drug use.  Limiting alcohol use.  Practicing safe sex.  Taking low-dose aspirin every day.  Taking vitamin and mineral supplements as recommended by your health care provider. What happens during an annual well check? The services and screenings done by your health care provider during your annual well check will depend on your age, overall health, lifestyle risk factors, and family history of disease. Counseling  Your health care  provider may ask you questions about your:  Alcohol use.  Tobacco use.  Drug use.  Emotional well-being.  Home and relationship well-being.  Sexual activity.  Eating habits.  History of falls.  Memory and ability to understand (cognition).  Work and work Statistician.  Reproductive health. Screening  You may have the following tests or measurements:  Height, weight, and BMI.  Blood pressure.  Lipid and cholesterol levels. These may be checked every 5 years, or more frequently if you are over 2 years old.  Skin check.  Lung cancer screening. You may have this screening every year starting at age 41 if you have a 30-pack-year history of smoking and currently smoke or have quit within the past 15 years.  Fecal occult blood test (FOBT) of the stool. You may have this test every year starting at age 48.  Flexible sigmoidoscopy or colonoscopy. You may have a sigmoidoscopy every 5 years or a colonoscopy every 10 years starting at age 49.  Hepatitis C blood test.  Hepatitis B blood test.  Sexually transmitted disease (STD) testing.  Diabetes screening. This is done by checking your blood sugar (glucose) after you have not eaten for a while (fasting). You may have this done every 1-3 years.  Bone density scan. This is done to screen for osteoporosis. You may have this done starting at age 58.  Mammogram. This may be done every 1-2 years. Talk to your health care provider about how often you should have regular mammograms. Talk with your health care provider about your test results, treatment options, and if necessary, the need for more tests. Vaccines  Your health care provider may recommend certain vaccines, such as:  Influenza vaccine. This is recommended every year.  Tetanus, diphtheria,  and acellular pertussis (Tdap, Td) vaccine. You may need a Td booster every 10 years.  Zoster vaccine. You may need this after age 40.  Pneumococcal 13-valent conjugate (PCV13)  vaccine. One dose is recommended after age 76.  Pneumococcal polysaccharide (PPSV23) vaccine. One dose is recommended after age 69. Talk to your health care provider about which screenings and vaccines you need and how often you need them. This information is not intended to replace advice given to you by your health care provider. Make sure you discuss any questions you have with your health care provider. Document Released: 03/23/2015 Document Revised: 11/14/2015 Document Reviewed: 12/26/2014 Elsevier Interactive Patient Education  2017 [place] Prevention in the Home Falls can cause injuries. They can happen to people of all ages. There are many things you can do to make your home safe and to help prevent falls. What can I do on the outside of my home?  Regularly fix the edges of walkways and driveways and fix any cracks.  Remove anything that might make you trip as you walk through a door, such as a raised step or threshold.  Trim any bushes or trees on the path to your home.  Use bright outdoor lighting.  Clear any walking paths of anything that might make someone trip, such as rocks or tools.  Regularly check to see if handrails are loose or broken. Make sure that both sides of any steps have handrails.  Any raised decks and porches should have guardrails on the edges.  Have any leaves, snow, or ice cleared regularly.  Use sand or salt on walking paths during winter.  Clean up any spills in your garage right away. This includes oil or grease spills. What can I do in the bathroom?  Use night lights.  Install grab bars by the toilet and in the tub and shower. Do not use towel bars as grab bars.  Use non-skid mats or decals in the tub or shower.  If you need to sit down in the shower, use a plastic, non-slip stool.  Keep the floor dry. Clean up any water that spills on the floor as soon as it happens.  Remove soap buildup in the tub or shower  regularly.  Attach bath mats securely with double-sided non-slip rug tape.  Do not have throw rugs and other things on the floor that can make you trip. What can I do in the bedroom?  Use night lights.  Make sure that you have a light by your bed that is easy to reach.  Do not use any sheets or blankets that are too big for your bed. They should not hang down onto the floor.  Have a firm chair that has side arms. You can use this for support while you get dressed.  Do not have throw rugs and other things on the floor that can make you trip. What can I do in the kitchen?  Clean up any spills right away.  Avoid walking on wet floors.  Keep items that you use a lot in easy-to-reach places.  If you need to reach something above you, use a strong step stool that has a grab bar.  Keep electrical cords out of the way.  Do not use floor polish or wax that makes floors slippery. If you must use wax, use non-skid floor wax.  Do not have throw rugs and other things on the floor that can make you trip. What can I do with my  stairs?  Do not leave any items on the stairs.  Make sure that there are handrails on both sides of the stairs and use them. Fix handrails that are broken or loose. Make sure that handrails are as long as the stairways.  Check any carpeting to make sure that it is firmly attached to the stairs. Fix any carpet that is loose or worn.  Avoid having throw rugs at the top or bottom of the stairs. If you do have throw rugs, attach them to the floor with carpet tape.  Make sure that you have a light switch at the top of the stairs and the bottom of the stairs. If you do not have them, ask someone to add them for you. What else can I do to help prevent falls?  Wear shoes that:  Do not have high heels.  Have rubber bottoms.  Are comfortable and fit you well.  Are closed at the toe. Do not wear sandals.  If you use a stepladder:  Make sure that it is fully  opened. Do not climb a closed stepladder.  Make sure that both sides of the stepladder are locked into place.  Ask someone to hold it for you, if possible.  Clearly mark and make sure that you can see:  Any grab bars or handrails.  First and last steps.  Where the edge of each step is.  Use tools that help you move around (mobility aids) if they are needed. These include:  Canes.  Walkers.  Scooters.  Crutches.  Turn on the lights when you go into a dark area. Replace any light bulbs as soon as they burn out.  Set up your furniture so you have a clear path. Avoid moving your furniture around.  If any of your floors are uneven, fix them.  If there are any pets around you, be aware of where they are.  Review your medicines with your doctor. Some medicines can make you feel dizzy. This can increase your chance of falling. Ask your doctor what other things that you can do to help prevent falls. This information is not intended to replace advice given to you by your health care provider. Make sure you discuss any questions you have with your health care provider. Document Released: 12/21/2008 Document Revised: 08/02/2015 Document Reviewed: 03/31/2014 Elsevier Interactive Patient Education  2017 [employer].

## 2020-07-17 ENCOUNTER — Other Ambulatory Visit: Payer: Self-pay

## 2020-07-17 ENCOUNTER — Ambulatory Visit (INDEPENDENT_AMBULATORY_CARE_PROVIDER_SITE_OTHER): Payer: PPO | Admitting: Family Medicine

## 2020-07-17 ENCOUNTER — Encounter: Payer: Self-pay | Admitting: Family Medicine

## 2020-07-17 VITALS — BP 164/73 | HR 61 | Temp 98.1°F | Ht 60.5 in | Wt 153.3 lb

## 2020-07-17 DIAGNOSIS — Z1211 Encounter for screening for malignant neoplasm of colon: Secondary | ICD-10-CM

## 2020-07-17 DIAGNOSIS — E782 Mixed hyperlipidemia: Secondary | ICD-10-CM | POA: Diagnosis not present

## 2020-07-17 DIAGNOSIS — I10 Essential (primary) hypertension: Secondary | ICD-10-CM

## 2020-07-17 DIAGNOSIS — Z1231 Encounter for screening mammogram for malignant neoplasm of breast: Secondary | ICD-10-CM

## 2020-07-17 DIAGNOSIS — Z0000 Encounter for general adult medical examination without abnormal findings: Secondary | ICD-10-CM

## 2020-07-17 LAB — URINALYSIS, ROUTINE W REFLEX MICROSCOPIC
Bilirubin, UA: NEGATIVE
Glucose, UA: NEGATIVE
Ketones, UA: NEGATIVE
Nitrite, UA: NEGATIVE
Specific Gravity, UA: >1.03 — ABNORMAL HIGH (ref 1.005–1.030)
Urobilinogen, Ur: 0.2 mg/dL (ref 0.2–1.0)
pH, UA: 5.5 (ref 5.0–7.5)

## 2020-07-17 LAB — MICROALBUMIN, URINE WAIVED
Creatinine, Urine Waived: 300 mg/dL (ref 10–300)
Microalb, Ur Waived: 80 mg/L — ABNORMAL HIGH (ref 0–19)

## 2020-07-17 LAB — MICROSCOPIC EXAMINATION
Bacteria, UA: NONE SEEN
WBC, UA: NONE SEEN /hpf (ref 0–5)

## 2020-07-17 MED ORDER — IBUPROFEN 600 MG PO TABS: ORAL_TABLET | ORAL | 1 refills | Status: DC

## 2020-07-17 MED ORDER — LISINOPRIL 20 MG PO TABS: 20.0000 mg | ORAL_TABLET | Freq: Every day | ORAL | 1 refills | Status: DC

## 2020-07-17 NOTE — Assessment & Plan Note (Signed)
Running a little high today. Will work on diet and exercise and monitor at home- if running >140/90, call. Continue to monitor.

## 2020-07-17 NOTE — Assessment & Plan Note (Signed)
Rechecking labs today. Await results. Treat as needed.  °

## 2020-07-17 NOTE — Progress Notes (Signed)
BP (!) 164/73   Pulse 61   Temp 98.1 F (36.7 C)   Ht 5' 0.5" (1.537 m)   Wt 153 lb 4.8 oz (69.5 kg)   SpO2 98%   BMI 29.45 kg/m    Subjective:    Patient ID: Vanessa Casey, female    DOB: Oct 08, 1950, 69 y.o.   MRN: [id number]  HPI: Vanessa Casey is a 70 y.o. female presenting on 07/17/2020 for comprehensive medical examination. Current medical complaints include:  HYPERTENSION / HYPERLIPIDEMIA Satisfied with current treatment? yes Duration of hypertension: chronic BP monitoring frequency: not checking BP medication side effects: no Past BP meds: lisinopril Duration of hyperlipidemia: chronic Cholesterol medication side effects: not on anything Cholesterol supplements: none Past cholesterol medications: none Medication compliance: excellent compliance Aspirin: no Recent stressors: no Recurrent headaches: no Visual changes: no Palpitations: no Dyspnea: no Chest pain: no Lower extremity edema: no Dizzy/lightheaded: no  Menopausal Symptoms: no  Depression Screen done today and results listed below:  Depression screen [facility] 2/9 07/17/2020 04/27/2020 04/21/2019 03/17/2019 01/14/2018  Decreased Interest 0 0 0 0 0  Down, Depressed, Hopeless 0 0 0 0 0  PHQ - 2 Score 0 0 0 0 0  Altered sleeping - - - - -  Tired, decreased energy - - - - -  Change in appetite - - - - -  Feeling bad or failure about yourself  - - - - -  Trouble concentrating - - - - -  Moving slowly or fidgety/restless - - - - -  Suicidal thoughts - - - - -  PHQ-9 Score - - - - -  Difficult doing work/chores - - - - -    Past Medical History:  Past Medical History:  Diagnosis Date  . Acute cholecystitis 03/04/2018  . Atypical mole 07/30/2017   left breast/excision  . Basal cell carcinoma 07/30/2017   right jaw  . Basal cell carcinoma 09/16/2017   left upper arm/excision  . Basal cell carcinoma 11/22/2018   right upper cutaneous lip  . Edema    FEET/ LEGS  . Medical history non-contributory   .  Post-operative nausea and vomiting 04/30/2017  . Shingles     Surgical History:  Past Surgical History:  Procedure Laterality Date  . ABDOMINAL HYSTERECTOMY    . BASAL CELL CARCINOMA EXCISION    . CATARACT EXTRACTION W/PHACO Right 05/05/2017   Procedure: CATARACT EXTRACTION PHACO AND INTRAOCULAR LENS PLACEMENT (IOC);  Surgeon: Birder Robson, MD;  Location: [facility] ORS;  Service: Ophthalmology;  Laterality: Right;  [place] 00:33.2 AP% 15.8 CDE 5.25 Fluid Pack Lot # [id number]  . CATARACT EXTRACTION W/PHACO Left 06/09/2017   Procedure: CATARACT EXTRACTION PHACO AND INTRAOCULAR LENS PLACEMENT ([place]);  Surgeon: Birder Robson, MD;  Location: [facility] ORS;  Service: Ophthalmology;  Laterality: Left;  [place] 01:02.2 AP% 15.2 CDE 9.48 Fluid Pack Lot # [id number]  . CHOLECYSTECTOMY N/A 03/04/2018   Procedure: LAPAROSCOPIC CHOLECYSTECTOMY;  Surgeon: Jules Husbands, MD;  Location: [facility] ORS;  Service: General;  Laterality: N/A;    Medications:  No current outpatient medications on file prior to visit.   No current facility-administered medications on file prior to visit.    Allergies:  No Known Allergies  Social History:  Social History   Socioeconomic History  . Marital status: Single    Spouse name: Not on file  . Number of children: Not on file  . Years of education: Not on file  . Highest education level: Not on file  Occupational History  .  Not on file  Tobacco Use  . Smoking status: Never Smoker  . Smokeless tobacco: Never Used  Vaping Use  . Vaping Use: Never used  Substance and Sexual Activity  . Alcohol use: No  . Drug use: No  . Sexual activity: Not Currently    Birth control/protection: Post-menopausal  Other Topics Concern  . Not on file  Social History Narrative  . Not on file   Social Determinants of Health   Financial Resource Strain: Low Risk   . Difficulty of Paying Living Expenses: Not hard at all  Food Insecurity: No Food Insecurity  . Worried About Sales executive in the Last Year: Never true  . Ran Out of Food in the Last Year: Never true  Transportation Needs: No Transportation Needs  . Lack of Transportation (Medical): No  . Lack of Transportation (Non-Medical): No  Physical Activity: Sufficiently Active  . Days of Exercise per Week: 7 days  . Minutes of Exercise per Session: 30 min  Stress: No Stress Concern Present  . Feeling of Stress : Not at all  Social Connections: Not on file  Intimate Partner Violence: Not on file   Social History   Tobacco Use  Smoking Status Never Smoker  Smokeless Tobacco Never Used   Social History   Substance and Sexual Activity  Alcohol Use No    Family History:  Family History  Problem Relation Age of Onset  . Dementia Mother   . Heart failure Father   . Heart attack Sister   . Heart attack Brother   . Breast cancer Maternal Aunt     Past medical history, surgical history, medications, allergies, family history and social history reviewed with patient today and changes made to appropriate areas of the chart.   Review of Systems  Constitutional: Negative.   HENT: Negative.   Eyes: Negative.   Respiratory: Negative.   Cardiovascular: Negative.   Gastrointestinal: Negative.   Skin: Negative.    All other ROS negative except what is listed above and in the HPI.      Objective:    BP (!) 164/73   Pulse 61   Temp 98.1 F (36.7 C)   Ht 5' 0.5" (1.537 m)   Wt 153 lb 4.8 oz (69.5 kg)   SpO2 98%   BMI 29.45 kg/m   Wt Readings from Last 3 Encounters:  07/17/20 153 lb 4.8 oz (69.5 kg)  04/27/20 140 lb (63.5 kg)  01/17/20 145 lb (65.8 kg)    Physical Exam Vitals and nursing note reviewed.  Constitutional:      General: She is not in acute distress.    Appearance: Normal appearance. She is not ill-appearing, toxic-appearing or diaphoretic.  HENT:     Head: Normocephalic and atraumatic.     Right Ear: Tympanic membrane, ear canal and external ear normal. There is no impacted  cerumen.     Left Ear: Tympanic membrane, ear canal and external ear normal. There is no impacted cerumen.     Nose: Nose normal. No congestion or rhinorrhea.     Mouth/Throat:     Mouth: Mucous membranes are moist.     Pharynx: Oropharynx is clear. No oropharyngeal exudate or posterior oropharyngeal erythema.  Eyes:     General: No scleral icterus.       Right eye: No discharge.        Left eye: No discharge.     Extraocular Movements: Extraocular movements intact.     Conjunctiva/sclera:  Conjunctivae normal.     Pupils: Pupils are equal, round, and reactive to light.  Neck:     Vascular: No carotid bruit.  Cardiovascular:     Rate and Rhythm: Normal rate and regular rhythm.     Pulses: Normal pulses.     Heart sounds: No murmur heard. No friction rub. No gallop.   Pulmonary:     Effort: Pulmonary effort is normal. No respiratory distress.     Breath sounds: Normal breath sounds. No stridor. No wheezing, rhonchi or rales.  Chest:     Chest wall: No tenderness.  Abdominal:     General: Abdomen is flat. Bowel sounds are normal. There is no distension.     Palpations: Abdomen is soft. There is no mass.     Tenderness: There is no abdominal tenderness. There is no right CVA tenderness, left CVA tenderness, guarding or rebound.     Hernia: No hernia is present.  Genitourinary:    Comments: Breast and pelvic exams deferred with shared decision making Musculoskeletal:        General: No swelling, tenderness, deformity or signs of injury.     Cervical back: Normal range of motion and neck supple. No rigidity. No muscular tenderness.     Right lower leg: No edema.     Left lower leg: No edema.  Lymphadenopathy:     Cervical: No cervical adenopathy.  Skin:    General: Skin is warm and dry.     Capillary Refill: Capillary refill takes less than 2 seconds.     Coloration: Skin is not jaundiced or pale.     Findings: No bruising, erythema, lesion or rash.  Neurological:     General:  No focal deficit present.     Mental Status: She is alert and oriented to person, place, and time. Mental status is at baseline.     Cranial Nerves: No cranial nerve deficit.     Sensory: No sensory deficit.     Motor: No weakness.     Coordination: Coordination normal.     Gait: Gait normal.     Deep Tendon Reflexes: Reflexes normal.  Psychiatric:        Mood and Affect: Mood normal.        Behavior: Behavior normal.        Thought Content: Thought content normal.        Judgment: Judgment normal.     Results for orders placed or performed in visit on 40/98/11  Basic metabolic panel  Result Value Ref Range   Glucose 76 65 - 99 mg/dL   BUN 16 8 - 27 mg/dL   Creatinine, Ser 0.75 0.57 - 1.00 mg/dL   GFR calc non Af Amer 83 >59 mL/min/1.73   GFR calc Af Amer 95 >59 mL/min/1.73   BUN/Creatinine Ratio 21 12 - 28   Sodium 142 134 - 144 mmol/L   Potassium 4.1 3.5 - 5.2 mmol/L   Chloride 104 96 - 106 mmol/L   CO2 22 20 - 29 mmol/L   Calcium 9.2 8.7 - 10.3 mg/dL      Assessment & Plan:   Problem List Items Addressed This Visit      Cardiovascular and Mediastinum   HTN (hypertension)    Running a little high today. Will work on diet and exercise and monitor at home- if running >140/90, call. Continue to monitor.       Relevant Medications   lisinopril (ZESTRIL) 20 MG tablet   Other Relevant  Orders   CBC with Differential/Platelet   Comprehensive metabolic panel   Urinalysis, Routine w reflex microscopic   TSH   Microalbumin, Urine Waived     Other   Mixed hyperlipidemia    Rechecking labs today. Await results. Treat as needed.       Relevant Medications   lisinopril (ZESTRIL) 20 MG tablet   Other Relevant Orders   CBC with Differential/Platelet   Comprehensive metabolic panel   Lipid Panel w/o Chol/HDL Ratio    Other Visit Diagnoses    Routine general medical examination at a health care facility    -  Primary   Vaccines up to date. Screening labs checked today.  Mammogram and colonoscopy ordered. DEXA up to date. Continue diet and exercise. Call with any concerns.    Encounter for screening mammogram for malignant neoplasm of breast       Mammogram ordered today   Relevant Orders   MM 3D SCREEN BREAST BILATERAL   Screening for colon cancer       Cologuard ordered today   Relevant Orders   Cologuard       Follow up plan: Return in about 6 months (around 01/17/2021).   LABORATORY TESTING:  - Pap smear: not applicable  IMMUNIZATIONS:   - Tdap: Tetanus vaccination status reviewed: last tetanus booster within 10 years. - Influenza: Up to date - Pneumovax: Up to date - Prevnar: Up to date - COVID: Up to date  SCREENING: -Mammogram: Ordered today  - Colonoscopy: Ordered today  - Bone Density: Up to date   PATIENT COUNSELING:   Advised to take 1 mg of folate supplement per day if capable of pregnancy.   Sexuality: Discussed sexually transmitted diseases, partner selection, use of condoms, avoidance of unintended pregnancy  and contraceptive alternatives.   Advised to avoid cigarette smoking.  I discussed with the patient that most people either abstain from alcohol or drink within safe limits (<=14/week and <=4 drinks/occasion for males, <=7/weeks and <= 3 drinks/occasion for females) and that the risk for alcohol disorders and other health effects rises proportionally with the number of drinks per week and how often a drinker exceeds daily limits.  Discussed cessation/primary prevention of drug use and availability of treatment for abuse.   Diet: Encouraged to adjust caloric intake to maintain  or achieve ideal body weight, to reduce intake of dietary saturated fat and total fat, to limit sodium intake by avoiding high sodium foods and not adding table salt, and to maintain adequate dietary potassium and calcium preferably from fresh fruits, vegetables, and low-fat dairy products.    stressed the importance of regular exercise  Injury  prevention: Discussed safety belts, safety helmets, smoke detector, smoking near bedding or upholstery.   Dental health: Discussed importance of regular tooth brushing, flossing, and dental visits.    NEXT PREVENTATIVE PHYSICAL DUE IN 1 YEAR. Return in about 6 months (around 01/17/2021).

## 2020-07-17 NOTE — Patient Instructions (Signed)
Health Maintenance After Age 69 After age 69, you are at a higher risk for certain long-term diseases and infections as well as injuries from falls. Falls are a major cause of broken bones and head injuries in people who are older than age 69. Getting regular preventive care can help to keep you healthy and well. Preventive care includes getting regular testing and making lifestyle changes as recommended by your health care provider. Talk with your health care provider about:  Which screenings and tests you should have. A screening is a test that checks for a disease when you have no symptoms.  A diet and exercise plan that is right for you. What should I know about screenings and tests to prevent falls? Screening and testing are the best ways to find a health problem early. Early diagnosis and treatment give you the best chance of managing medical conditions that are common after age 69. Certain conditions and lifestyle choices may make you more likely to have a fall. Your health care provider may recommend:  Regular vision checks. Poor vision and conditions such as cataracts can make you more likely to have a fall. If you wear glasses, make sure to get your prescription updated if your vision changes.  Medicine review. Work with your health care provider to regularly review all of the medicines you are taking, including over-the-counter medicines. Ask your health care provider about any side effects that may make you more likely to have a fall. Tell your health care provider if any medicines that you take make you feel dizzy or sleepy.  Osteoporosis screening. Osteoporosis is a condition that causes the bones to get weaker. This can make the bones weak and cause them to break more easily.  Blood pressure screening. Blood pressure changes and medicines to control blood pressure can make you feel dizzy.  Strength and balance checks. Your health care provider may recommend certain tests to check your  strength and balance while standing, walking, or changing positions.  Foot health exam. Foot pain and numbness, as well as not wearing proper footwear, can make you more likely to have a fall.  Depression screening. You may be more likely to have a fall if you have a fear of falling, feel emotionally low, or feel unable to do activities that you used to do.  Alcohol use screening. Using too much alcohol can affect your balance and may make you more likely to have a fall. What actions can I take to lower my risk of falls? General instructions  Talk with your health care provider about your risks for falling. Tell your health care provider if: ? You fall. Be sure to tell your health care provider about all falls, even ones that seem minor. ? You feel dizzy, sleepy, or off-balance.  Take over-the-counter and prescription medicines only as told by your health care provider. These include any supplements.  Eat a healthy diet and maintain a healthy weight. A healthy diet includes low-fat dairy products, low-fat (lean) meats, and fiber from whole grains, beans, and lots of fruits and vegetables. Home safety  Remove any tripping hazards, such as rugs, cords, and clutter.  Install safety equipment such as grab bars in bathrooms and safety rails on stairs.  Keep rooms and walkways well-lit. Activity  Follow a regular exercise program to stay fit. This will help you maintain your balance. Ask your health care provider what types of exercise are appropriate for you.  If you need a cane or walker,   use it as recommended by your health care provider.  Wear supportive shoes that have nonskid soles.   Lifestyle  Do not drink alcohol if your health care provider tells you not to drink.  If you drink alcohol, limit how much you have: ? 0-1 drink a day for women. ? 0-2 drinks a day for men.  Be aware of how much alcohol is in your drink. In the [place] one drink equals one typical bottle of beer (12  oz), one-half glass of wine (5 oz), or one shot of hard liquor (1 oz).  Do not use any products that contain nicotine or tobacco, such as cigarettes and e-cigarettes. If you need help quitting, ask your health care provider. Summary  Having a healthy lifestyle and getting preventive care can help to protect your health and wellness after age 69.  Screening and testing are the best way to find a health problem early and help you avoid having a fall. Early diagnosis and treatment give you the best chance for managing medical conditions that are more common for people who are older than age 69.  Falls are a major cause of broken bones and head injuries in people who are older than age 69. Take precautions to prevent a fall at home.  Work with your health care provider to learn what changes you can make to improve your health and wellness and to prevent falls. This information is not intended to replace advice given to you by your health care provider. Make sure you discuss any questions you have with your health care provider. Document Revised: 06/17/2018 Document Reviewed: 01/07/2017 Elsevier Patient Education  2021 [employer]  

## 2020-07-18 LAB — CBC WITH DIFFERENTIAL/PLATELET
Basophils Absolute: 0 10*3/uL (ref 0.0–0.2)
Basos: 1 %
EOS (ABSOLUTE): 0.1 10*3/uL (ref 0.0–0.4)
Eos: 1 %
Hematocrit: 39.7 % (ref 34.0–46.6)
Hemoglobin: 12.7 g/dL (ref 11.1–15.9)
Immature Grans (Abs): 0 10*3/uL (ref 0.0–0.1)
Immature Granulocytes: 0 %
Lymphocytes Absolute: 1.6 10*3/uL (ref 0.7–3.1)
Lymphs: 23 %
MCH: 26.5 pg — ABNORMAL LOW (ref 26.6–33.0)
MCHC: 32 g/dL (ref 31.5–35.7)
MCV: 83 fL (ref 79–97)
Monocytes Absolute: 0.6 10*3/uL (ref 0.1–0.9)
Monocytes: 9 %
Neutrophils Absolute: 4.6 10*3/uL (ref 1.4–7.0)
Neutrophils: 66 %
Platelets: 250 10*3/uL (ref 150–450)
RBC: 4.79 x10E6/uL (ref 3.77–5.28)
RDW: 13.5 % (ref 11.7–15.4)
WBC: 6.9 10*3/uL (ref 3.4–10.8)

## 2020-07-18 LAB — TSH: TSH: 4.17 u[IU]/mL (ref 0.450–4.500)

## 2020-07-18 LAB — COMPREHENSIVE METABOLIC PANEL
ALT: 14 IU/L (ref 0–32)
AST: 20 IU/L (ref 0–40)
Albumin/Globulin Ratio: 1.4 (ref 1.2–2.2)
Albumin: 4.2 g/dL (ref 3.8–4.8)
Alkaline Phosphatase: 108 IU/L (ref 44–121)
BUN/Creatinine Ratio: 24 (ref 12–28)
BUN: 19 mg/dL (ref 8–27)
Bilirubin Total: 0.2 mg/dL (ref 0.0–1.2)
CO2: 23 mmol/L (ref 20–29)
Calcium: 9.5 mg/dL (ref 8.7–10.3)
Chloride: 103 mmol/L (ref 96–106)
Creatinine, Ser: 0.79 mg/dL (ref 0.57–1.00)
Globulin, Total: 3 g/dL (ref 1.5–4.5)
Glucose: 84 mg/dL (ref 65–99)
Potassium: 4.3 mmol/L (ref 3.5–5.2)
Sodium: 143 mmol/L (ref 134–144)
Total Protein: 7.2 g/dL (ref 6.0–8.5)
eGFR: 81 mL/min/{1.73_m2} (ref >59)

## 2020-07-18 LAB — LIPID PANEL W/O CHOL/HDL RATIO
Cholesterol, Total: 229 mg/dL — ABNORMAL HIGH (ref 100–199)
HDL: 44 mg/dL (ref >39)
LDL Chol Calc (NIH): 123 mg/dL — ABNORMAL HIGH (ref 0–99)
Triglycerides: 350 mg/dL — ABNORMAL HIGH (ref 0–149)
VLDL Cholesterol Cal: 62 mg/dL — ABNORMAL HIGH (ref 5–40)

## 2020-12-17 IMAGING — MG DIGITAL SCREENING BILATERAL MAMMOGRAM WITH TOMO AND CAD
8 series · 9 of 24 positions shown · non-contrast
Comparison: Previous exam(s).

CLINICAL DATA: Screening.

EXAM:
DIGITAL SCREENING BILATERAL MAMMOGRAM WITH TOMO AND CAD

[L CC synth-2D]
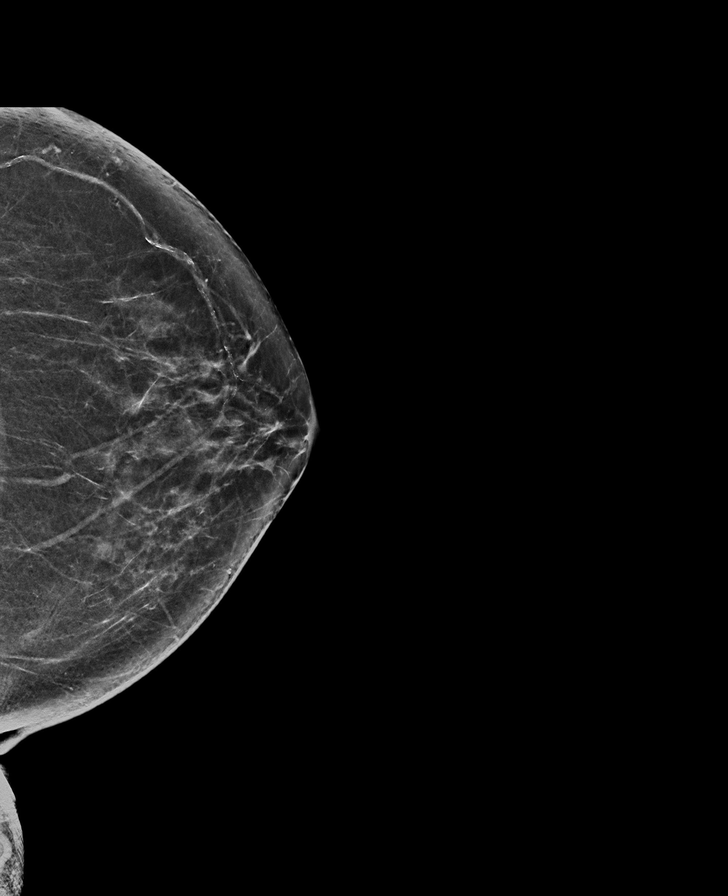

[R CC synth-2D]
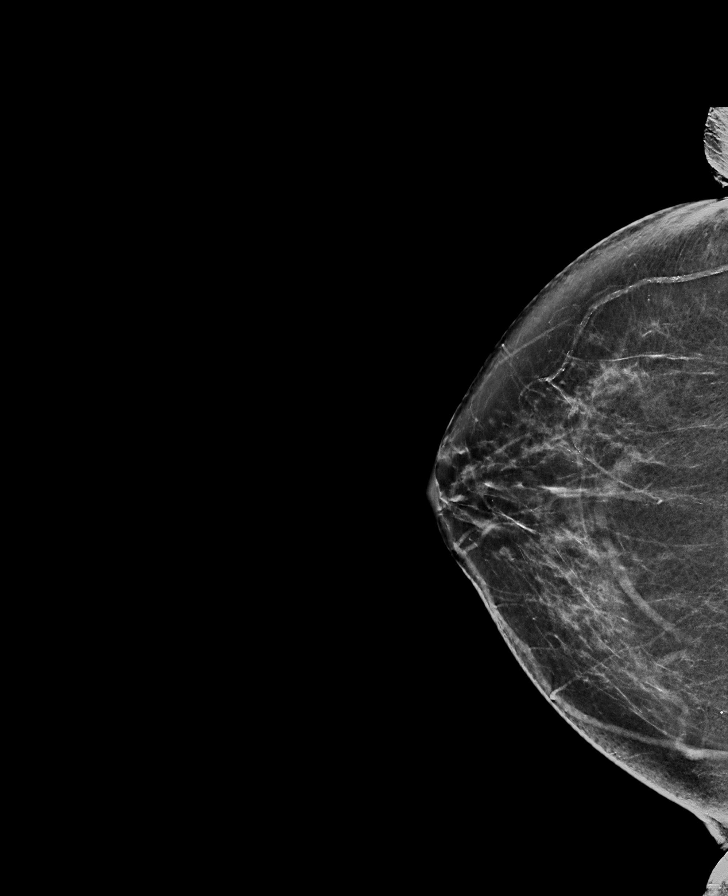

[R MLO synth-2D]
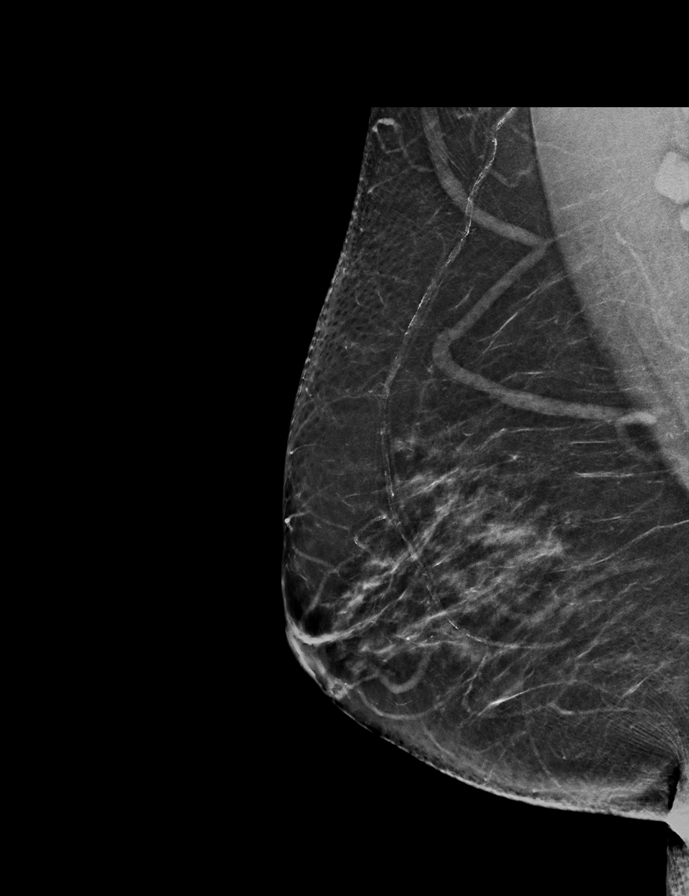

[L MLO synth-2D]
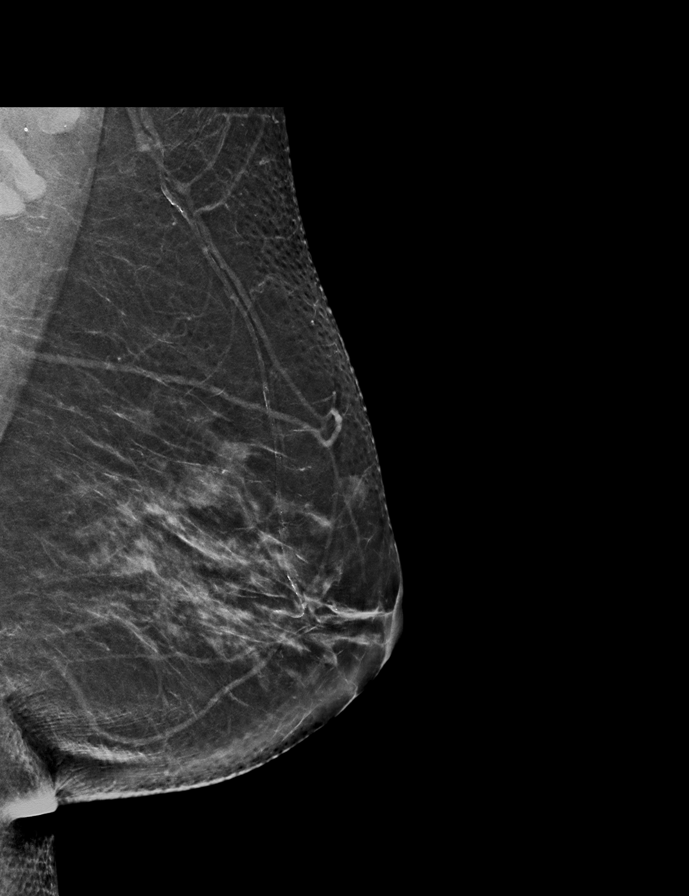

[R CC tomo · 2 of 65 frames shown]
[frame 21/65]
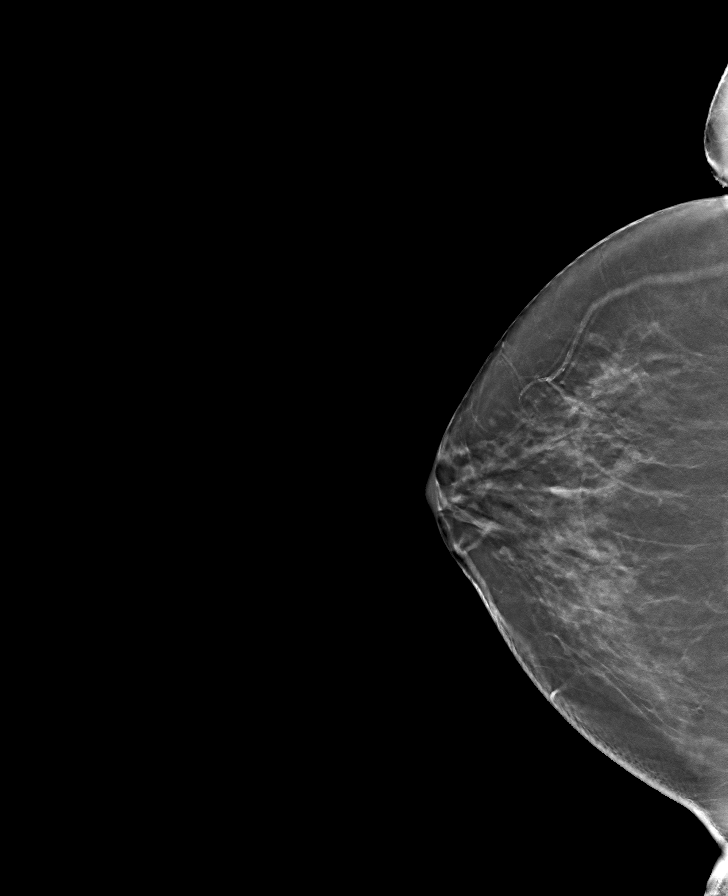
[frame 33/65]
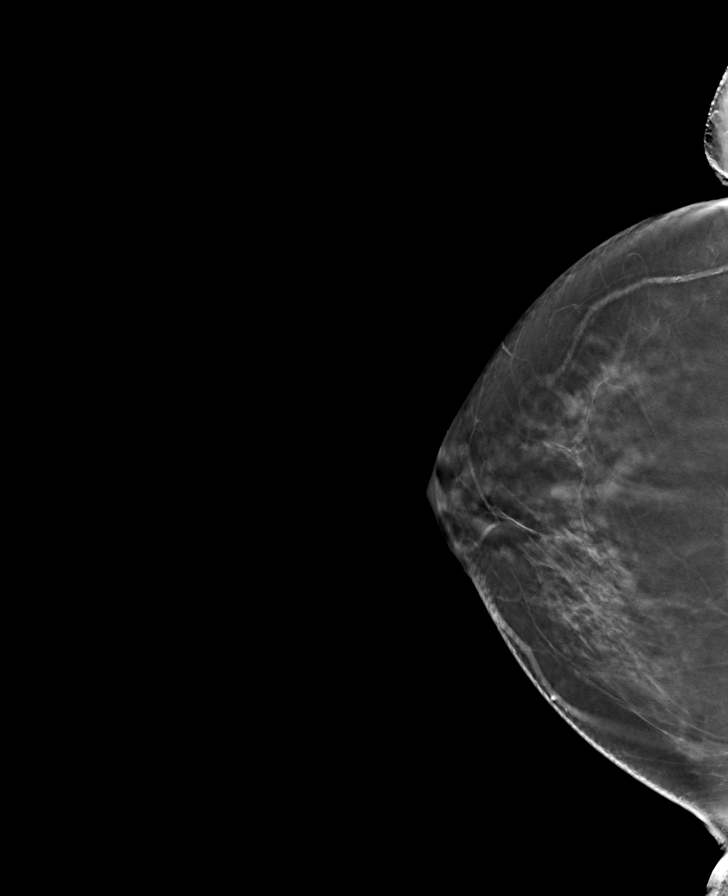

[L MLO tomo · tomo slice 35/69.0]
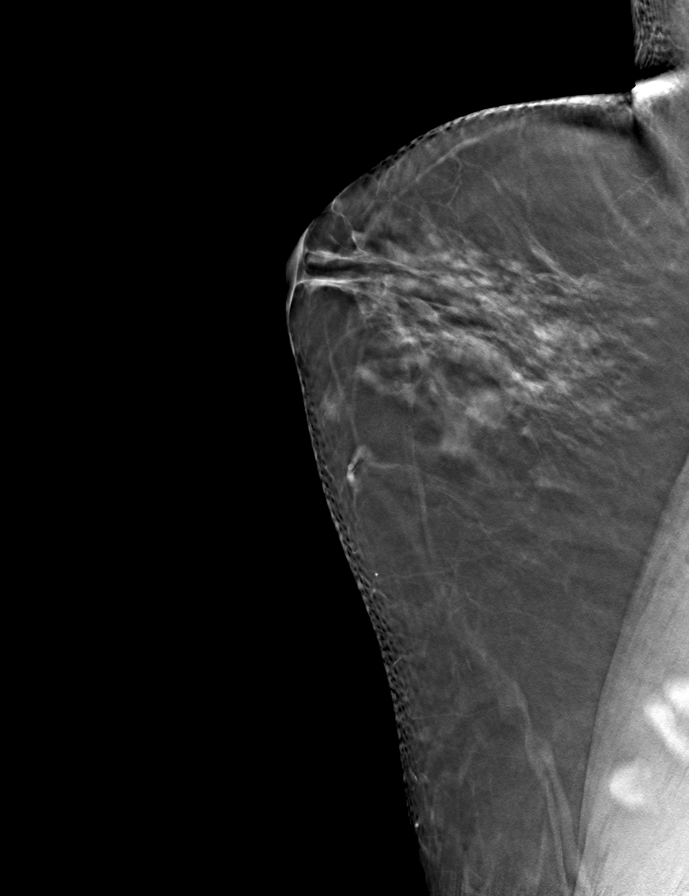

[R MLO tomo · tomo slice 35/69.0]
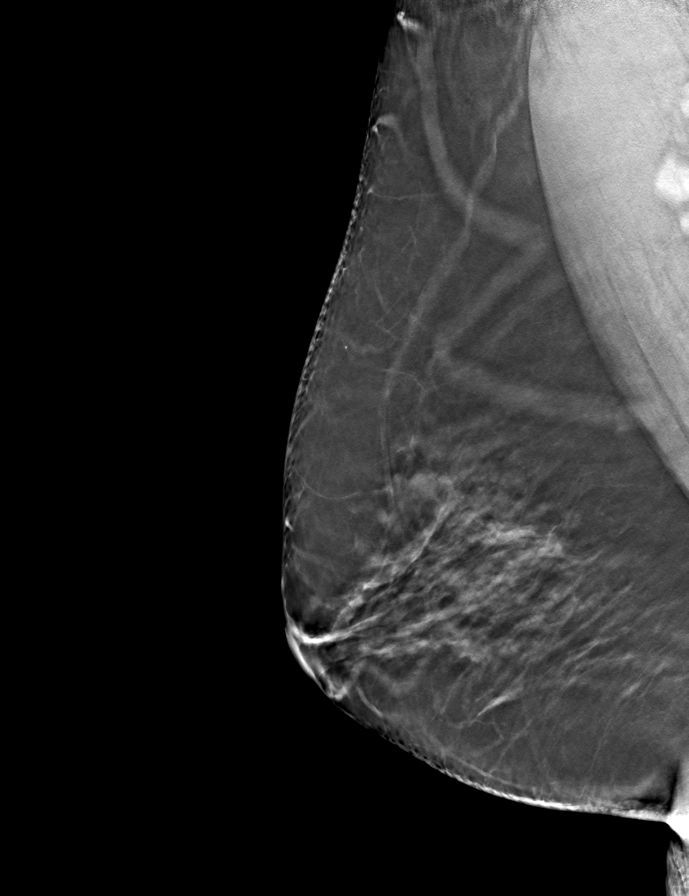

[L CC tomo · tomo slice 33/66.0]
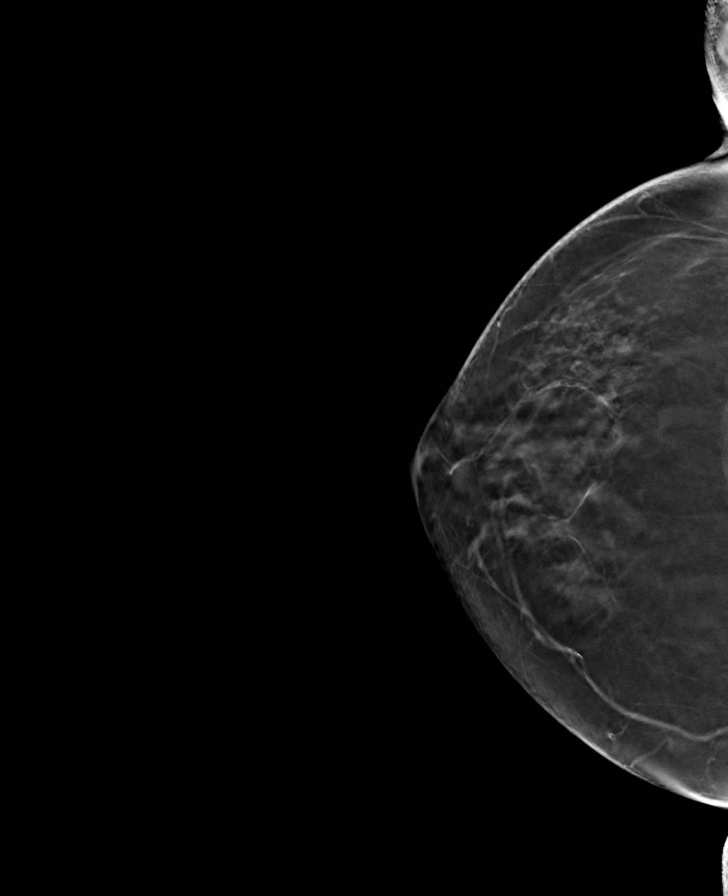

[9 of 24 positions shown; findings below may reference images not displayed]

ACR Breast Density Category c: The breast tissue is heterogeneously
dense, which may obscure small masses.
FINDINGS: There are no findings suspicious for malignancy. Images were
processed with CAD.
IMPRESSION: No mammographic evidence of malignancy. A result letter of this
screening mammogram will be mailed directly to the patient.

RECOMMENDATION:
Screening mammogram in one year. (Code:[id number])

BI-RADS CATEGORY  1: Negative.

## 2020-12-25 ENCOUNTER — Encounter: Payer: Self-pay | Admitting: General Surgery

## 2021-01-17 ENCOUNTER — Ambulatory Visit: Payer: PPO | Admitting: Family Medicine

## 2021-01-18 ENCOUNTER — Ambulatory Visit: Payer: PPO | Admitting: Family Medicine

## 2021-04-11 ENCOUNTER — Other Ambulatory Visit: Payer: Self-pay

## 2021-04-11 ENCOUNTER — Encounter: Payer: Self-pay | Admitting: Family Medicine

## 2021-04-11 ENCOUNTER — Ambulatory Visit (INDEPENDENT_AMBULATORY_CARE_PROVIDER_SITE_OTHER): Payer: Medicare Other | Admitting: Family Medicine

## 2021-04-11 VITALS — BP 169/72 | HR 64 | Temp 98.2°F | Wt 159.2 lb

## 2021-04-11 DIAGNOSIS — R0981 Nasal congestion: Secondary | ICD-10-CM | POA: Diagnosis not present

## 2021-04-11 DIAGNOSIS — I10 Essential (primary) hypertension: Secondary | ICD-10-CM | POA: Diagnosis not present

## 2021-04-11 MED ORDER — PREDNISONE 50 MG PO TABS: 50.0000 mg | ORAL_TABLET | Freq: Every day | ORAL | 0 refills | Status: DC

## 2021-04-11 MED ORDER — LISINOPRIL 20 MG PO TABS: 20.0000 mg | ORAL_TABLET | Freq: Every day | ORAL | 1 refills | Status: DC

## 2021-04-11 NOTE — Progress Notes (Signed)
BP (!) 169/72    Pulse 64    Temp 98.2 F (36.8 C)    Wt 159 lb 3.2 oz (72.2 kg)    SpO2 99%    BMI 30.58 kg/m    Subjective:    Patient ID: Vanessa Casey, female    DOB: 08-Aug-1950, 71 y.o.   MRN: [id number]  HPI: Vanessa Casey is a 71 y.o. female  Chief Complaint  Patient presents with   Nasal Congestion    Patient has congestion for about a week, has some cough.    Hypertension    Patient has been out of BP medication for a few weeks    UPPER RESPIRATORY TRACT INFECTION Duration: about a week Worst symptom: Fever: no Cough: no Shortness of breath: no Wheezing: no Chest pain: no Chest tightness: no Chest congestion: yes Nasal congestion: yes Runny nose: no Post nasal drip: no Sneezing: no Sore throat: no Swollen glands: no Sinus pressure: no Headache: no Face pain: no Toothache: no Ear pain: no  Ear pressure: no  Eyes red/itching:no Eye drainage/crusting: no  Vomiting: no Rash: no Fatigue: no Sick contacts: no Strep contacts: no  Context: worse Recurrent sinusitis: no Relief with OTC cold/cough medications: no  Treatments attempted: none   HYPERTENSION Hypertension status: uncontrolled  Satisfied with current treatment? no Duration of hypertension: chronic BP monitoring frequency:  not checking BP medication side effects:  no Medication compliance: has been off of it for several weeks due to issues with the pharmacy Previous BP meds:lisinopril Aspirin: no Recurrent headaches: no Visual changes: no Palpitations: no Dyspnea: no Chest pain: no Lower extremity edema: no Dizzy/lightheaded: no   Relevant past medical, surgical, family and social history reviewed and updated as indicated. Interim medical history since our last visit reviewed. Allergies and medications reviewed and updated.  Review of Systems  Constitutional: Negative.   HENT: Negative.    Respiratory:  Positive for cough and chest tightness. Negative for apnea, choking,  shortness of breath, wheezing and stridor.   Cardiovascular: Negative.   Gastrointestinal: Negative.   Psychiatric/Behavioral: Negative.     Per HPI unless specifically indicated above     Objective:    BP (!) 169/72    Pulse 64    Temp 98.2 F (36.8 C)    Wt 159 lb 3.2 oz (72.2 kg)    SpO2 99%    BMI 30.58 kg/m   Wt Readings from Last 3 Encounters:  04/11/21 159 lb 3.2 oz (72.2 kg)  07/17/20 153 lb 4.8 oz (69.5 kg)  04/27/20 140 lb (63.5 kg)    Physical Exam Vitals and nursing note reviewed.  Constitutional:      General: She is not in acute distress.    Appearance: Normal appearance. She is not ill-appearing, toxic-appearing or diaphoretic.  HENT:     Head: Normocephalic and atraumatic.     Right Ear: External ear normal.     Left Ear: External ear normal.     Nose: Nose normal.     Mouth/Throat:     Mouth: Mucous membranes are moist.     Pharynx: Oropharynx is clear.  Eyes:     General: No scleral icterus.       Right eye: No discharge.        Left eye: No discharge.     Extraocular Movements: Extraocular movements intact.     Conjunctiva/sclera: Conjunctivae normal.     Pupils: Pupils are equal, round, and reactive to light.  Cardiovascular:  Rate and Rhythm: Normal rate and regular rhythm.     Pulses: Normal pulses.     Heart sounds: Normal heart sounds. No murmur heard.   No friction rub. No gallop.  Pulmonary:     Effort: Pulmonary effort is normal. No respiratory distress.     Breath sounds: No stridor. Wheezing present. No rhonchi or rales.  Chest:     Chest wall: No tenderness.  Musculoskeletal:        General: Normal range of motion.     Cervical back: Normal range of motion and neck supple.  Skin:    General: Skin is warm and dry.     Capillary Refill: Capillary refill takes less than 2 seconds.     Coloration: Skin is not jaundiced or pale.     Findings: No bruising, erythema, lesion or rash.  Neurological:     General: No focal deficit  present.     Mental Status: She is alert and oriented to person, place, and time. Mental status is at baseline.  Psychiatric:        Mood and Affect: Mood normal.        Behavior: Behavior normal.        Thought Content: Thought content normal.        Judgment: Judgment normal.    Results for orders placed or performed in visit on 07/17/20  Microscopic Examination   Urine  Result Value Ref Range   WBC, UA None seen 0 - 5 /hpf   RBC 3-10 (A) 0 - 2 /hpf   Epithelial Cells (non renal) 0-10 0 - 10 /hpf   Mucus, UA Present (A) Not Estab.   Bacteria, UA None seen None seen/Few  CBC with Differential/Platelet  Result Value Ref Range   WBC 6.9 3.4 - 10.8 x10E3/uL   RBC 4.79 3.77 - 5.28 x10E6/uL   Hemoglobin 12.7 11.1 - 15.9 g/dL   Hematocrit 39.7 34.0 - 46.6 %   MCV 83 79 - 97 fL   MCH 26.5 (L) 26.6 - 33.0 pg   MCHC 32.0 31.5 - 35.7 g/dL   RDW 13.5 11.7 - 15.4 %   Platelets 250 150 - 450 x10E3/uL   Neutrophils 66 Not Estab. %   Lymphs 23 Not Estab. %   Monocytes 9 Not Estab. %   Eos 1 Not Estab. %   Basos 1 Not Estab. %   Neutrophils Absolute 4.6 1.4 - 7.0 x10E3/uL   Lymphocytes Absolute 1.6 0.7 - 3.1 x10E3/uL   Monocytes Absolute 0.6 0.1 - 0.9 x10E3/uL   EOS (ABSOLUTE) 0.1 0.0 - 0.4 x10E3/uL   Basophils Absolute 0.0 0.0 - 0.2 x10E3/uL   Immature Granulocytes 0 Not Estab. %   Immature Grans (Abs) 0.0 0.0 - 0.1 x10E3/uL  Comprehensive metabolic panel  Result Value Ref Range   Glucose 84 65 - 99 mg/dL   BUN 19 8 - 27 mg/dL   Creatinine, Ser 0.79 0.57 - 1.00 mg/dL   eGFR 81 >59 mL/min/1.73   BUN/Creatinine Ratio 24 12 - 28   Sodium 143 134 - 144 mmol/L   Potassium 4.3 3.5 - 5.2 mmol/L   Chloride 103 96 - 106 mmol/L   CO2 23 20 - 29 mmol/L   Calcium 9.5 8.7 - 10.3 mg/dL   Total Protein 7.2 6.0 - 8.5 g/dL   Albumin 4.2 3.8 - 4.8 g/dL   Globulin, Total 3.0 1.5 - 4.5 g/dL   Albumin/Globulin Ratio 1.4 1.2 - 2.2   Bilirubin Total  0.2 0.0 - 1.2 mg/dL   Alkaline Phosphatase 108  44 - 121 IU/L   AST 20 0 - 40 IU/L   ALT 14 0 - 32 IU/L  Lipid Panel w/o Chol/HDL Ratio  Result Value Ref Range   Cholesterol, Total 229 (H) 100 - 199 mg/dL   Triglycerides 350 (H) 0 - 149 mg/dL   HDL 44 >39 mg/dL   VLDL Cholesterol Cal 62 (H) 5 - 40 mg/dL   LDL Chol Calc (NIH) 123 (H) 0 - 99 mg/dL  Urinalysis, Routine w reflex microscopic  Result Value Ref Range   Specific Gravity, UA >1.030 (H) 1.005 - 1.030   pH, UA 5.5 5.0 - 7.5   Color, UA Yellow Yellow   Appearance Ur Cloudy (A) Clear   Leukocytes,UA Trace (A) Negative   Protein,UA 1+ (A) Negative/Trace   Glucose, UA Negative Negative   Ketones, UA Negative Negative   RBC, UA 3+ (A) Negative   Bilirubin, UA Negative Negative   Urobilinogen, Ur 0.2 0.2 - 1.0 mg/dL   Nitrite, UA Negative Negative   Microscopic Examination See below:   TSH  Result Value Ref Range   TSH 4.170 0.450 - 4.500 uIU/mL  Microalbumin, Urine Waived  Result Value Ref Range   Microalb, Ur Waived 80 (H) 0 - 19 mg/L   Creatinine, Urine Waived 300 10 - 300 mg/dL   Microalb/Creat Ratio 30-300 (H) <30 mg/g      Assessment & Plan:   Problem List Items Addressed This Visit       Cardiovascular and Mediastinum   HTN (hypertension)    Has been off her meds. Restart and recheck with labs in 1 month. Rx sent to her pharmacy.      Relevant Medications   lisinopril (ZESTRIL) 20 MG tablet   Other Visit Diagnoses     Nasal congestion    -  Primary   Will treat with burst of steroids. Likely due to gas heat and exposure to cigarette smoke. Call if not getting better or getting worse.         Follow up plan: Return in about 4 weeks (around 05/09/2021).

## 2021-04-11 NOTE — Assessment & Plan Note (Signed)
Has been off her meds. Restart and recheck with labs in 1 month. Rx sent to her pharmacy.

## 2021-04-29 ENCOUNTER — Ambulatory Visit (INDEPENDENT_AMBULATORY_CARE_PROVIDER_SITE_OTHER): Payer: Medicare Other | Admitting: *Deleted

## 2021-04-29 DIAGNOSIS — Z0000 Encounter for general adult medical examination without abnormal findings: Secondary | ICD-10-CM | POA: Diagnosis not present

## 2021-04-29 NOTE — Progress Notes (Signed)
Subjective:   Vanessa Casey is a 71 y.o. female who presents for Medicare Annual (Subsequent) preventive examination.  I connected with  Vanessa Casey on 04/29/21 by a telephone enabled telemedicine application and verified that I am speaking with the correct person using two identifiers.   I discussed the limitations of evaluation and management by telemedicine. The patient expressed understanding and agreed to proceed.  Patient location: home  Provider location: Tele-health not in office    Review of Systems     Cardiac Risk Factors include: advanced age (>29men, >61 women);hypertension     Objective:    Today's Vitals   There is no height or weight on file to calculate BMI.  Advanced Directives 04/29/2021 04/27/2020 04/21/2019 03/03/2018 01/14/2018 06/09/2017 05/05/2017  Does Patient Have a Medical Advance Directive? No No No No No No No  Would patient like information on creating a medical advance directive? - - - No - Patient declined Yes (MAU/Ambulatory/Procedural Areas - Information given) No - Patient declined -    Current Medications (verified) Outpatient Encounter Medications as of 04/29/2021  Medication Sig   ibuprofen (ADVIL) 600 MG tablet TAKE 1 TABLET BY MOUTH EVERY 8 HOURS AS NEEDED FOR HEADACHE AND MILD PAIN   lisinopril (ZESTRIL) 20 MG tablet Take 1 tablet (20 mg total) by mouth daily.   predniSONE (DELTASONE) 50 MG tablet Take 1 tablet (50 mg total) by mouth daily with breakfast.   No facility-administered encounter medications on file as of 04/29/2021.    Allergies (verified) Patient has no known allergies.   History: Past Medical History:  Diagnosis Date   Acute cholecystitis 03/04/2018   Atypical mole 07/30/2017   left breast/excision   Basal cell carcinoma 07/30/2017   right jaw   Basal cell carcinoma 09/16/2017   left upper arm/excision   Basal cell carcinoma 11/22/2018   right upper cutaneous lip   Edema    FEET/ LEGS   Medical history  non-contributory    Post-operative nausea and vomiting 04/30/2017   Shingles    Past Surgical History:  Procedure Laterality Date   ABDOMINAL HYSTERECTOMY     BASAL CELL CARCINOMA EXCISION     CATARACT EXTRACTION W/PHACO Right 05/05/2017   Procedure: CATARACT EXTRACTION PHACO AND INTRAOCULAR LENS PLACEMENT ([place]);  Surgeon: Birder Robson, MD;  Location: [facility] ORS;  Service: Ophthalmology;  Laterality: Right;  [place] 00:33.2 AP% 15.8 CDE 5.25 Fluid Pack Lot # [id number]   CATARACT EXTRACTION W/PHACO Left 06/09/2017   Procedure: CATARACT EXTRACTION PHACO AND INTRAOCULAR LENS PLACEMENT ([facility]);  Surgeon: Birder Robson, MD;  Location: [facility] ORS;  Service: Ophthalmology;  Laterality: Left;  [place] 01:02.2 AP% 15.2 CDE 9.48 Fluid Pack Lot # [id number]   CHOLECYSTECTOMY N/A 03/04/2018   Procedure: LAPAROSCOPIC CHOLECYSTECTOMY;  Surgeon: Jules Husbands, MD;  Location: [facility] ORS;  Service: General;  Laterality: N/A;   Family History  Problem Relation Age of Onset   Dementia Mother    Heart failure Father    Heart attack Sister    Heart attack Brother    Breast cancer Maternal Aunt    Social History   Socioeconomic History   Marital status: Single    Spouse name: Not on file   Number of children: Not on file   Years of education: Not on file   Highest education level: Not on file  Occupational History   Not on file  Tobacco Use   Smoking status: Never   Smokeless tobacco: Never  Vaping Use   Vaping Use:  Never used  Substance and Sexual Activity   Alcohol use: No   Drug use: No   Sexual activity: Not Currently    Birth control/protection: Post-menopausal  Other Topics Concern   Not on file  Social History Narrative   Not on file   Social Determinants of Health   Financial Resource Strain: Low Risk    Difficulty of Paying Living Expenses: Not hard at all  Food Insecurity: No Food Insecurity   Worried About Charity fundraiser in the Last Year: Never true   Ran Out of Food in the  Last Year: Never true  Transportation Needs: No Transportation Needs   Lack of Transportation (Medical): No   Lack of Transportation (Non-Medical): No  Physical Activity: Insufficiently Active   Days of Exercise per Week: 3 days   Minutes of Exercise per Session: 30 min  Stress: No Stress Concern Present   Feeling of Stress : Not at all  Social Connections: Unknown   Frequency of Communication with Friends and Family: More than three times a week   Frequency of Social Gatherings with Friends and Family: More than three times a week   Attends Religious Services: Never   Marine scientist or Organizations: No   Attends Music therapist: Never   Marital Status: Not on file    Tobacco Counseling Counseling given: Not Answered   Clinical Intake:  Pre-visit preparation completed: Yes  Pain : No/denies pain     Nutritional Risks: None Diabetes: No  How often do you need to have someone help you when you read instructions, pamphlets, or other written materials from your doctor or pharmacy?: 1 - Never  Diabetic?no  Interpreter Needed?: No  Information entered by :: Leroy Kennedy LPN   Activities of Daily Living In your present state of health, do you have any difficulty performing the following activities: 04/29/2021 07/17/2020  Hearing? N N  Vision? N N  Difficulty concentrating or making decisions? N N  Walking or climbing stairs? N N  Dressing or bathing? N N  Doing errands, shopping? N N  Preparing Food and eating ? N -  Using the Toilet? N -  In the past six months, have you accidently leaked urine? N -  Do you have problems with loss of bowel control? N -  Managing your Medications? N -  Managing your Finances? N -  Some recent data might be hidden    Patient Care Team: Valerie Roys, DO as PCP - General (Family Medicine) Anell Barr, OD (Optometry)  Indicate any recent Medical Services you may have received from other than [facility]  providers in the past year (date may be approximate).     Assessment:   This is a routine wellness examination for Vanessa Casey.  Hearing/Vision screen Hearing Screening - Comments:: No trouble hearing Vision Screening - Comments:: [place] Up to date  Dietary issues and exercise activities discussed: Current Exercise Habits: Home exercise routine, Type of exercise: walking, Time (Minutes): 30, Frequency (Times/Week): 3, Weekly Exercise (Minutes/Week): 90, Intensity: Mild   Goals Addressed             This Visit's Progress    Patient Stated       Stay healthy       Depression Screen PHQ 2/9 Scores 04/29/2021 07/17/2020 04/27/2020 04/21/2019 03/17/2019 01/14/2018 01/14/2018  PHQ - 2 Score 0 0 0 0 0 0 0  PHQ- 9 Score - - - - - - 0  Fall Risk Fall Risk  04/29/2021 07/17/2020 04/27/2020 03/17/2019 03/11/2018  Falls in the past year? 0 0 0 0 0  Number falls in past yr: 0 0 - 0 -  Injury with Fall? 0 0 - 0 -  Risk for fall due to : - No Fall Risks Medication side effect - -  Follow up Falls evaluation completed;Falls prevention discussed Falls evaluation completed Falls evaluation completed;Education provided;Falls prevention discussed - -    FALL RISK PREVENTION PERTAINING TO THE HOME:  Any stairs in or around the home? Yes  If so, are there any without handrails? No  Home free of loose throw rugs in walkways, pet beds, electrical cords, etc? Yes  Adequate lighting in your home to reduce risk of falls? Yes   ASSISTIVE DEVICES UTILIZED TO PREVENT FALLS:  Life alert? No  Use of a cane, walker or w/c? No  Grab bars in the bathroom? No  Shower chair or bench in shower? No  Elevated toilet seat or a handicapped toilet? No   TIMED UP AND GO:  Was the test performed? No .    Cognitive Function:  Normal cognitive status assessed by direct observation by this Nurse Health Advisor. No abnormalities found.       6CIT Screen 04/27/2020 03/17/2019 01/14/2018 01/12/2017  What Year? 0  points 0 points 0 points 0 points  What month? 0 points 0 points 0 points 0 points  What time? 0 points 0 points 0 points 0 points  Count back from 20 0 points 2 points 0 points 0 points  Months in reverse 0 points 0 points 0 points 0 points  Repeat phrase 0 points 2 points 0 points 2 points  Total Score 0 4 0 2    Immunizations Immunization History  Administered Date(s) Administered   Fluad Quad(high Dose 65+) 12/03/2018   Influenza, High Dose Seasonal PF 12/16/2017   Influenza,inj,Quad PF,6+ Mos 12/11/2016   Influenza-Unspecified 12/18/2020   PFIZER(Purple Top)SARS-COV-2 Vaccination 05/12/2019, 06/02/2019   Pneumococcal Conjugate-13 01/12/2017   Pneumococcal Polysaccharide-23 01/14/2018   Td 11/08/2014    TDAP status: Up to date  Flu Vaccine status: Up to date  Pneumococcal vaccine status: Up to date  Covid-19 vaccine status: Information provided on how to obtain vaccines.   Qualifies for Shingles Vaccine? Yes   Zostavax completed No   Shingrix Completed?: No.    Education has been provided regarding the importance of this vaccine. Patient has been advised to call insurance company to determine out of pocket expense if they have not yet received this vaccine. Advised may also receive vaccine at local pharmacy or Health Dept. Verbalized acceptance and understanding.  Screening Tests Health Maintenance  Topic Date Due   COVID-19 Vaccine (3 - Pfizer risk series) 06/30/2019   Fecal DNA (Cologuard)  01/14/2020   MAMMOGRAM  04/06/2020   Zoster Vaccines- Shingrix (1 of 2) 07/27/2021 (Originally 01/01/1970)   TETANUS/TDAP  11/07/2024   Pneumonia Vaccine 61+ Years old  Completed   INFLUENZA VACCINE  Completed   DEXA SCAN  Completed   Hepatitis C Screening  Completed   HPV VACCINES  Aged Out    Health Maintenance  Health Maintenance Due  Topic Date Due   COVID-19 Vaccine (3 - Pfizer risk series) 06/30/2019   Fecal DNA (Cologuard)  01/14/2020   MAMMOGRAM  04/06/2020     Colorectal cancer screening: Type of screening: Cologuard. Completed 2022. Repeat every 3 years  Mammogram status: Ordered  . Pt provided with contact info  and advised to call to schedule appt.   Bone Density status: Completed 2020. Results reflect: Bone density results: OSTEOPENIA. Repeat every 5 years.  Lung Cancer Screening: (Low Dose CT Chest recommended if Age 53-80 years, 30 pack-year currently smoking OR have quit w/in 15years.) does not qualify.   Lung Cancer Screening Referral:   Additional Screening:  Hepatitis C Screening: does not qualify; Completed 2018  Vision Screening: Recommended annual ophthalmology exams for early detection of glaucoma and other disorders of the eye. Is the patient up to date with their annual eye exam?  No  Who is the provider or what is the name of the office in which the patient attends annual eye exams? [facility] If pt is not established with a provider, would they like to be referred to a provider to establish care? No .   Dental Screening: Recommended annual dental exams for proper oral hygiene  Community Resource Referral / Chronic Care Management: CRR required this visit?  No   CCM required this visit?  No      Plan:     I have personally reviewed and noted the following in the patients chart:   Medical and social history Use of alcohol, tobacco or illicit drugs  Current medications and supplements including opioid prescriptions.  Functional ability and status Nutritional status Physical activity Advanced directives List of other physicians Hospitalizations, surgeries, and ER visits in previous 12 months Vitals Screenings to include cognitive, depression, and falls Referrals and appointments  In addition, I have reviewed and discussed with patient certain preventive protocols, quality metrics, and best practice recommendations. A written personalized care plan for preventive services as well as general  preventive health recommendations were provided to patient.     Leroy Kennedy, LPN   [id number]   Nurse Notes:

## 2021-04-29 NOTE — Patient Instructions (Signed)
Vanessa Casey , Thank you for taking time to come for your Medicare Wellness Visit. I appreciate your ongoing commitment to your health goals. Please review the following plan we discussed and let me know if I can assist you in the future.   Screening recommendations/referrals: Colonoscopy: Cologuard 2022 up to date Mammogram: Education provided Bone Density: up to date Recommended yearly ophthalmology/optometry visit for glaucoma screening and checkup Recommended yearly dental visit for hygiene and checkup  Vaccinations: Influenza vaccine: up to date Pneumococcal vaccine: up to date Tdap vaccine: up to date Shingles vaccine: Education provided    Advanced directives: Education provided  Conditions/risks identified:   Next appointment: 05-09-2021  @ 1:20 [facility] 65 Years and Older, Female Preventive care refers to lifestyle choices and visits with your health care provider that can promote health and wellness. What does preventive care include? A yearly physical exam. This is also called an annual well check. Dental exams once or twice a year. Routine eye exams. Ask your health care provider how often you should have your eyes checked. Personal lifestyle choices, including: Daily care of your teeth and gums. Regular physical activity. Eating a healthy diet. Avoiding tobacco and drug use. Limiting alcohol use. Practicing safe sex. Taking low-dose aspirin every day. Taking vitamin and mineral supplements as recommended by your health care provider. What happens during an annual well check? The services and screenings done by your health care provider during your annual well check will depend on your age, overall health, lifestyle risk factors, and family history of disease. Counseling  Your health care provider may ask you questions about your: Alcohol use. Tobacco use. Drug use. Emotional well-being. Home and relationship well-being. Sexual  activity. Eating habits. History of falls. Memory and ability to understand (cognition). Work and work Statistician. Reproductive health. Screening  You may have the following tests or measurements: Height, weight, and BMI. Blood pressure. Lipid and cholesterol levels. These may be checked every 5 years, or more frequently if you are over 29 years old. Skin check. Lung cancer screening. You may have this screening every year starting at age 52 if you have a 30-pack-year history of smoking and currently smoke or have quit within the past 15 years. Fecal occult blood test (FOBT) of the stool. You may have this test every year starting at age 51. Flexible sigmoidoscopy or colonoscopy. You may have a sigmoidoscopy every 5 years or a colonoscopy every 10 years starting at age 34. Hepatitis C blood test. Hepatitis B blood test. Sexually transmitted disease (STD) testing. Diabetes screening. This is done by checking your blood sugar (glucose) after you have not eaten for a while (fasting). You may have this done every 1-3 years. Bone density scan. This is done to screen for osteoporosis. You may have this done starting at age 18. Mammogram. This may be done every 1-2 years. Talk to your health care provider about how often you should have regular mammograms. Talk with your health care provider about your test results, treatment options, and if necessary, the need for more tests. Vaccines  Your health care provider may recommend certain vaccines, such as: Influenza vaccine. This is recommended every year. Tetanus, diphtheria, and acellular pertussis (Tdap, Td) vaccine. You may need a Td booster every 10 years. Zoster vaccine. You may need this after age 41. Pneumococcal 13-valent conjugate (PCV13) vaccine. One dose is recommended after age 77. Pneumococcal polysaccharide (PPSV23) vaccine. One dose is recommended after age 39. Talk to your health  care provider about which screenings and vaccines  you need and how often you need them. This information is not intended to replace advice given to you by your health care provider. Make sure you discuss any questions you have with your health care provider. Document Released: 03/23/2015 Document Revised: 11/14/2015 Document Reviewed: 12/26/2014 Elsevier Interactive Patient Education  2017 [employer] Prevention in the Home Falls can cause injuries. They can happen to people of all ages. There are many things you can do to make your home safe and to help prevent falls. What can I do on the outside of my home? Regularly fix the edges of walkways and driveways and fix any cracks. Remove anything that might make you trip as you walk through a door, such as a raised step or threshold. Trim any bushes or trees on the path to your home. Use bright outdoor lighting. Clear any walking paths of anything that might make someone trip, such as rocks or tools. Regularly check to see if handrails are loose or broken. Make sure that both sides of any steps have handrails. Any raised decks and porches should have guardrails on the edges. Have any leaves, snow, or ice cleared regularly. Use sand or salt on walking paths during winter. Clean up any spills in your garage right away. This includes oil or grease spills. What can I do in the bathroom? Use night lights. Install grab bars by the toilet and in the tub and shower. Do not use towel bars as grab bars. Use non-skid mats or decals in the tub or shower. If you need to sit down in the shower, use a plastic, non-slip stool. Keep the floor dry. Clean up any water that spills on the floor as soon as it happens. Remove soap buildup in the tub or shower regularly. Attach bath mats securely with double-sided non-slip rug tape. Do not have throw rugs and other things on the floor that can make you trip. What can I do in the bedroom? Use night lights. Make sure that you have a light by your bed that  is easy to reach. Do not use any sheets or blankets that are too big for your bed. They should not hang down onto the floor. Have a firm chair that has side arms. You can use this for support while you get dressed. Do not have throw rugs and other things on the floor that can make you trip. What can I do in the kitchen? Clean up any spills right away. Avoid walking on wet floors. Keep items that you use a lot in easy-to-reach places. If you need to reach something above you, use a strong step stool that has a grab bar. Keep electrical cords out of the way. Do not use floor polish or wax that makes floors slippery. If you must use wax, use non-skid floor wax. Do not have throw rugs and other things on the floor that can make you trip. What can I do with my stairs? Do not leave any items on the stairs. Make sure that there are handrails on both sides of the stairs and use them. Fix handrails that are broken or loose. Make sure that handrails are as long as the stairways. Check any carpeting to make sure that it is firmly attached to the stairs. Fix any carpet that is loose or worn. Avoid having throw rugs at the top or bottom of the stairs. If you do have throw rugs, attach them to  the floor with carpet tape. Make sure that you have a light switch at the top of the stairs and the bottom of the stairs. If you do not have them, ask someone to add them for you. What else can I do to help prevent falls? Wear shoes that: Do not have high heels. Have rubber bottoms. Are comfortable and fit you well. Are closed at the toe. Do not wear sandals. If you use a stepladder: Make sure that it is fully opened. Do not climb a closed stepladder. Make sure that both sides of the stepladder are locked into place. Ask someone to hold it for you, if possible. Clearly mark and make sure that you can see: Any grab bars or handrails. First and last steps. Where the edge of each step is. Use tools that help you  move around (mobility aids) if they are needed. These include: Canes. Walkers. Scooters. Crutches. Turn on the lights when you go into a dark area. Replace any light bulbs as soon as they burn out. Set up your furniture so you have a clear path. Avoid moving your furniture around. If any of your floors are uneven, fix them. If there are any pets around you, be aware of where they are. Review your medicines with your doctor. Some medicines can make you feel dizzy. This can increase your chance of falling. Ask your doctor what other things that you can do to help prevent falls. This information is not intended to replace advice given to you by your health care provider. Make sure you discuss any questions you have with your health care provider. Document Released: 12/21/2008 Document Revised: 08/02/2015 Document Reviewed: 03/31/2014 Elsevier Interactive Patient Education  2017 [employer].

## 2021-05-09 ENCOUNTER — Ambulatory Visit: Payer: Medicare Other | Admitting: Family Medicine

## 2021-05-17 ENCOUNTER — Ambulatory Visit: Payer: Medicare Other | Admitting: Family Medicine

## 2021-05-28 ENCOUNTER — Ambulatory Visit (INDEPENDENT_AMBULATORY_CARE_PROVIDER_SITE_OTHER): Payer: Medicare Other | Admitting: Family Medicine

## 2021-05-28 ENCOUNTER — Encounter: Payer: Self-pay | Admitting: Family Medicine

## 2021-05-28 ENCOUNTER — Other Ambulatory Visit: Payer: Self-pay

## 2021-05-28 VITALS — BP 119/73 | HR 67 | Temp 98.9°F | Wt 160.6 lb

## 2021-05-28 DIAGNOSIS — R3989 Other symptoms and signs involving the genitourinary system: Secondary | ICD-10-CM | POA: Diagnosis not present

## 2021-05-28 DIAGNOSIS — R8281 Pyuria: Secondary | ICD-10-CM | POA: Diagnosis not present

## 2021-05-28 DIAGNOSIS — I10 Essential (primary) hypertension: Secondary | ICD-10-CM | POA: Diagnosis not present

## 2021-05-28 NOTE — Progress Notes (Signed)
? ?BP 119/73   Pulse 67   Temp 98.9 ?F (37.2 ?C)   Wt 160 lb 9.6 oz (72.8 kg)   SpO2 98%   BMI 30.85 kg/m?   ? ?Subjective:  ? ? Patient ID: Vanessa Casey, female    DOB: 1951/01/02, 71 y.o.   MRN: [id number] ? ?HPI: ?Vanessa Casey is a 71 y.o. female ? ?Chief Complaint  ?Patient presents with  ? Hypertension  ? dark urine  ?  Patient states her urine has been dark yellow for a few weeks.   ? ?HYPERTENSION ?Hypertension status: controlled  ?Satisfied with current treatment? yes ?Duration of hypertension: chronic ?BP monitoring frequency:  not checking ?BP medication side effects:  no ?Medication compliance: excellent compliance ?Previous BP meds:lisinopril ?Aspirin: no ?Recurrent headaches: no ?Visual changes: no ?Palpitations: no ?Dyspnea: no ?Chest pain: no ?Lower extremity edema: no ?Dizzy/lightheaded: no ? ?Has been having dark colored urine. She has no symptoms. No changes in frequency. Generally otherwise feeling well.  ? ?Relevant past medical, surgical, family and social history reviewed and updated as indicated. Interim medical history since our last visit reviewed. ?Allergies and medications reviewed and updated. ? ?Review of Systems  ?Constitutional: Negative.   ?Respiratory: Negative.    ?Cardiovascular: Negative.   ?Gastrointestinal: Negative.   ?Genitourinary: Negative.   ?Musculoskeletal: Negative.   ?Neurological: Negative.   ?Psychiatric/Behavioral: Negative.    ? ?Per HPI unless specifically indicated above ? ?   ?Objective:  ?  ?BP 119/73   Pulse 67   Temp 98.9 ?F (37.2 ?C)   Wt 160 lb 9.6 oz (72.8 kg)   SpO2 98%   BMI 30.85 kg/m?   ?Wt Readings from Last 3 Encounters:  ?05/28/21 160 lb 9.6 oz (72.8 kg)  ?04/11/21 159 lb 3.2 oz (72.2 kg)  ?07/17/20 153 lb 4.8 oz (69.5 kg)  ?  ?Physical Exam ?Vitals and nursing note reviewed.  ?Constitutional:   ?   General: She is not in acute distress. ?   Appearance: Normal appearance. She is not ill-appearing, toxic-appearing or diaphoretic.  ?HENT:  ?    Head: Normocephalic and atraumatic.  ?   Right Ear: External ear normal.  ?   Left Ear: External ear normal.  ?   Nose: Nose normal.  ?   Mouth/Throat:  ?   Mouth: Mucous membranes are moist.  ?   Pharynx: Oropharynx is clear.  ?Eyes:  ?   General: No scleral icterus.    ?   Right eye: No discharge.     ?   Left eye: No discharge.  ?   Extraocular Movements: Extraocular movements intact.  ?   Conjunctiva/sclera: Conjunctivae normal.  ?   Pupils: Pupils are equal, round, and reactive to light.  ?Cardiovascular:  ?   Rate and Rhythm: Normal rate and regular rhythm.  ?   Pulses: Normal pulses.  ?   Heart sounds: Normal heart sounds. No murmur heard. ?  No friction rub. No gallop.  ?Pulmonary:  ?   Effort: Pulmonary effort is normal. No respiratory distress.  ?   Breath sounds: Normal breath sounds. No stridor. No wheezing, rhonchi or rales.  ?Chest:  ?   Chest wall: No tenderness.  ?Musculoskeletal:     ?   General: Normal range of motion.  ?   Cervical back: Normal range of motion and neck supple.  ?Skin: ?   General: Skin is warm and dry.  ?   Capillary Refill: Capillary refill takes less than 2  seconds.  ?   Coloration: Skin is not jaundiced or pale.  ?   Findings: No bruising, erythema, lesion or rash.  ?Neurological:  ?   General: No focal deficit present.  ?   Mental Status: She is alert and oriented to person, place, and time. Mental status is at baseline.  ?Psychiatric:     ?   Mood and Affect: Mood normal.     ?   Behavior: Behavior normal.     ?   Thought Content: Thought content normal.     ?   Judgment: Judgment normal.  ? ? ?Results for orders placed or performed in visit on 07/17/20  ?Microscopic Examination  ? Urine  ?Result Value Ref Range  ? WBC, UA None seen 0 - 5 /hpf  ? RBC 3-10 (A) 0 - 2 /hpf  ? Epithelial Cells (non renal) 0-10 0 - 10 /hpf  ? Mucus, UA Present (A) Not Estab.  ? Bacteria, UA None seen None seen/Few  ?CBC with Differential/Platelet  ?Result Value Ref Range  ? WBC 6.9 3.4 - 10.8  x10E3/uL  ? RBC 4.79 3.77 - 5.28 x10E6/uL  ? Hemoglobin 12.7 11.1 - 15.9 g/dL  ? Hematocrit 39.7 34.0 - 46.6 %  ? MCV 83 79 - 97 fL  ? MCH 26.5 (L) 26.6 - 33.0 pg  ? MCHC 32.0 31.5 - 35.7 g/dL  ? RDW 13.5 11.7 - 15.4 %  ? Platelets 250 150 - 450 x10E3/uL  ? Neutrophils 66 Not Estab. %  ? Lymphs 23 Not Estab. %  ? Monocytes 9 Not Estab. %  ? Eos 1 Not Estab. %  ? Basos 1 Not Estab. %  ? Neutrophils Absolute 4.6 1.4 - 7.0 x10E3/uL  ? Lymphocytes Absolute 1.6 0.7 - 3.1 x10E3/uL  ? Monocytes Absolute 0.6 0.1 - 0.9 x10E3/uL  ? EOS (ABSOLUTE) 0.1 0.0 - 0.4 x10E3/uL  ? Basophils Absolute 0.0 0.0 - 0.2 x10E3/uL  ? Immature Granulocytes 0 Not Estab. %  ? Immature Grans (Abs) 0.0 0.0 - 0.1 x10E3/uL  ?Comprehensive metabolic panel  ?Result Value Ref Range  ? Glucose 84 65 - 99 mg/dL  ? BUN 19 8 - 27 mg/dL  ? Creatinine, Ser 0.79 0.57 - 1.00 mg/dL  ? eGFR 81 >59 mL/min/1.73  ? BUN/Creatinine Ratio 24 12 - 28  ? Sodium 143 134 - 144 mmol/L  ? Potassium 4.3 3.5 - 5.2 mmol/L  ? Chloride 103 96 - 106 mmol/L  ? CO2 23 20 - 29 mmol/L  ? Calcium 9.5 8.7 - 10.3 mg/dL  ? Total Protein 7.2 6.0 - 8.5 g/dL  ? Albumin 4.2 3.8 - 4.8 g/dL  ? Globulin, Total 3.0 1.5 - 4.5 g/dL  ? Albumin/Globulin Ratio 1.4 1.2 - 2.2  ? Bilirubin Total 0.2 0.0 - 1.2 mg/dL  ? Alkaline Phosphatase 108 44 - 121 IU/L  ? AST 20 0 - 40 IU/L  ? ALT 14 0 - 32 IU/L  ?Lipid Panel w/o Chol/HDL Ratio  ?Result Value Ref Range  ? Cholesterol, Total 229 (H) 100 - 199 mg/dL  ? Triglycerides 350 (H) 0 - 149 mg/dL  ? HDL 44 >39 mg/dL  ? VLDL Cholesterol Cal 62 (H) 5 - 40 mg/dL  ? LDL Chol Calc (NIH) 123 (H) 0 - 99 mg/dL  ?Urinalysis, Routine w reflex microscopic  ?Result Value Ref Range  ? Specific Gravity, UA >1.030 (H) 1.005 - 1.030  ? pH, UA 5.5 5.0 - 7.5  ? Color, UA  Yellow Yellow  ? Appearance Ur Cloudy (A) Clear  ? Leukocytes,UA Trace (A) Negative  ? Protein,UA 1+ (A) Negative/Trace  ? Glucose, UA Negative Negative  ? Ketones, UA Negative Negative  ? RBC, UA 3+ (A)  Negative  ? Bilirubin, UA Negative Negative  ? Urobilinogen, Ur 0.2 0.2 - 1.0 mg/dL  ? Nitrite, UA Negative Negative  ? Microscopic Examination See below:   ?TSH  ?Result Value Ref Range  ? TSH 4.170 0.450 - 4.500 uIU/mL  ?Microalbumin, Urine Waived  ?Result Value Ref Range  ? Microalb, Ur Waived 80 (H) 0 - 19 mg/L  ? Creatinine, Urine Waived 300 10 - 300 mg/dL  ? Microalb/Creat Ratio 30-300 (H) <30 mg/g  ? ?   ?Assessment & Plan:  ? ?Problem List Items Addressed This Visit   ? ?  ? Cardiovascular and Mediastinum  ? HTN (hypertension) - Primary  ?  Under good control on current regimen. Continue current regimen. Continue to monitor. Call with any concerns. Refills given. Labs drawn today. ? ?  ?  ? Relevant Orders  ? Basic metabolic panel  ? ?Other Visit Diagnoses   ? ? Dark yellow-colored urine      ? Will check urine and BMP. Await results. Treat as needed.   ? Relevant Orders  ? Urinalysis, Routine w reflex microscopic  ? Pyuria      ? Relevant Orders  ? Urine Culture  ? ?  ?  ? ?Follow up plan: ?Return August Physical. ? ? ? ? ? ?

## 2021-05-28 NOTE — Assessment & Plan Note (Signed)
Under good control on current regimen. Continue current regimen. Continue to monitor. Call with any concerns. Refills given. Labs drawn today.   

## 2021-05-29 LAB — MICROSCOPIC EXAMINATION

## 2021-05-29 LAB — URINALYSIS, ROUTINE W REFLEX MICROSCOPIC
Bilirubin, UA: NEGATIVE
Glucose, UA: NEGATIVE
Ketones, UA: NEGATIVE
Nitrite, UA: NEGATIVE
Protein,UA: NEGATIVE
Specific Gravity, UA: 1.015 (ref 1.005–1.030)
Urobilinogen, Ur: 0.2 mg/dL (ref 0.2–1.0)
pH, UA: 7 (ref 5.0–7.5)

## 2021-05-29 LAB — BASIC METABOLIC PANEL
BUN/Creatinine Ratio: 21 (ref 12–28)
BUN: 16 mg/dL (ref 8–27)
CO2: 24 mmol/L (ref 20–29)
Calcium: 9.8 mg/dL (ref 8.7–10.3)
Chloride: 100 mmol/L (ref 96–106)
Creatinine, Ser: 0.78 mg/dL (ref 0.57–1.00)
Glucose: 90 mg/dL (ref 70–99)
Potassium: 4.3 mmol/L (ref 3.5–5.2)
Sodium: 139 mmol/L (ref 134–144)
eGFR: 82 mL/min/{1.73_m2} (ref >59)

## 2021-05-31 LAB — URINE CULTURE: Organism ID, Bacteria: NO GROWTH

## 2021-06-10 ENCOUNTER — Telehealth: Payer: Self-pay | Admitting: Family Medicine

## 2021-06-10 NOTE — Telephone Encounter (Signed)
Copied from [place] [id number]. Topic: General - Other ?>> Jun 10, 2021 11:10 AM Vanessa Casey A wrote: ?Reason for CRM: The patient would like to speak with a member of clinical staff when possible ? ?The patient would like to review their lab results from 05/28/21 when available ? ?Please contact further ?

## 2021-06-10 NOTE — Telephone Encounter (Signed)
Called and discussed results with the patient. She states she read her mychart message this morning as well.  ?

## 2021-06-12 ENCOUNTER — Ambulatory Visit: Payer: Self-pay | Admitting: *Deleted

## 2021-06-12 NOTE — Telephone Encounter (Signed)
Summary: Swelling in feet and ankles  ? Feet and ankle swelling for a week or so  ?Best contact: [phone]   ?  ? ?Chief Complaint: swelling in legs 10 days ?Symptoms: swollen ankles and legs especially at end of day ?Frequency: daily ?Pertinent Negatives: Patient denies fever, redness ?Disposition: '[]'$ ED /'[]'$ Urgent Care (no appt availability in office) / '[x]'$ Appointment(In office/virtual)/ '[]'$  [place]/ '[]'$ Home Care/ '[]'$ Refused Recommended Disposition /'[]'$ [facility]/ '[]'$  Follow-up with PCP ?Additional Notes: Pt wanted to only see Dr. Wynetta Emery so appt is 06/17/21. She is to call back to see one of the other providers sooner if her swelling worsens, redness appears or starts running a fever. ? ?Reason for Disposition ? [1] MODERATE pain (e.g., interferes with normal activities, limping) AND [2] present > 3 days ? ?Answer Assessment - Initial Assessment Questions ?1. LOCATION: "Which ankle is swollen?" "Where is the swelling?" ?    both ?2. ONSET: "When did the swelling start?" ?    Almost 2 weeks ?3. SIZE: "How large is the swelling?" ?    By evening it is a lot ?4. PAIN: "Is there any pain?" If Yes, ask: "How bad is it?" (Scale 1-10; or mild, moderate, severe) ?  - NONE (0): no pain. ?  - MILD (1-3): doesn't interfere with normal activities.  ?  - MODERATE (4-7): interferes with normal activities (e.g., work or school) or awakens from sleep, limping.  ?  - SEVERE (8-10): excruciating pain, unable to do any normal activities, unable to walk.  ?    Maybe a little when it gets moving up legs ?5. CAUSE: "What do you think caused the ankle swelling?" ?    no ?6. OTHER SYMPTOMS: "Do you have any other symptoms?" (e.g., fever, chest pain, difficulty breathing, calf pain) ?    no ?7. PREGNANCY: "Is there any chance you are pregnant?" "When was your last menstrual period?" ?    Na ? ?Protocols used: Ankle Swelling-A-AH ? ?

## 2021-06-17 ENCOUNTER — Encounter: Payer: Self-pay | Admitting: Family Medicine

## 2021-06-17 ENCOUNTER — Ambulatory Visit (INDEPENDENT_AMBULATORY_CARE_PROVIDER_SITE_OTHER): Payer: Medicare Other | Admitting: Family Medicine

## 2021-06-17 VITALS — BP 119/67 | HR 61 | Temp 98.2°F | Wt 162.4 lb

## 2021-06-17 DIAGNOSIS — R609 Edema, unspecified: Secondary | ICD-10-CM | POA: Diagnosis not present

## 2021-06-17 DIAGNOSIS — I10 Essential (primary) hypertension: Secondary | ICD-10-CM | POA: Diagnosis not present

## 2021-06-17 MED ORDER — HYDROCHLOROTHIAZIDE 25 MG PO TABS: 25.0000 mg | ORAL_TABLET | Freq: Every day | ORAL | 3 refills | Status: DC

## 2021-06-17 MED ORDER — LISINOPRIL 20 MG PO TABS: 10.0000 mg | ORAL_TABLET | Freq: Every day | ORAL | 1 refills | Status: DC

## 2021-06-17 NOTE — Progress Notes (Signed)
? ?BP 119/67   Pulse 61   Temp 98.2 ?F (36.8 ?C)   Wt 162 lb 6.4 oz (73.7 kg)   SpO2 98%   BMI 31.19 kg/m?   ? ?Subjective:  ? ? Patient ID: Vanessa Casey, female    DOB: [birth year], 71 y.o.   MRN: [id number] ? ?HPI: ?Vanessa Casey is a 71 y.o. female ? ?Chief Complaint  ?Patient presents with  ? Edema  ?  Patient states for about a week both her feet and ankles swell during the day. Patient states when she wakes up in the morning they are normal.   ? ?Legs have been swelling for about a week. + pain more achey when they are swollen. They are better in the AM and worse at night. Both legs. No known dietary indiscretions. She has not tried compression hoses. No redness, no heat no shortness of breath. She is otherwise feeling well. No other concerns or complaints at this time.   ? ?Relevant past medical, surgical, family and social history reviewed and updated as indicated. Interim medical history since our last visit reviewed. ?Allergies and medications reviewed and updated. ? ?Review of Systems  ?Constitutional: Negative.   ?Respiratory: Negative.    ?Cardiovascular:  Positive for leg swelling. Negative for chest pain and palpitations.  ?Gastrointestinal: Negative.   ?Musculoskeletal: Negative.   ?Neurological: Negative.   ?Psychiatric/Behavioral: Negative.    ? ?Per HPI unless specifically indicated above ? ?   ?Objective:  ?  ?BP 119/67   Pulse 61   Temp 98.2 ?F (36.8 ?C)   Wt 162 lb 6.4 oz (73.7 kg)   SpO2 98%   BMI 31.19 kg/m?   ?Wt Readings from Last 3 Encounters:  ?06/17/21 162 lb 6.4 oz (73.7 kg)  ?05/28/21 160 lb 9.6 oz (72.8 kg)  ?04/11/21 159 lb 3.2 oz (72.2 kg)  ?  ?Physical Exam ?Vitals and nursing note reviewed.  ?Constitutional:   ?   General: She is not in acute distress. ?   Appearance: Normal appearance. She is not ill-appearing, toxic-appearing or diaphoretic.  ?HENT:  ?   Head: Normocephalic and atraumatic.  ?   Right Ear: External ear normal.  ?   Left Ear: External ear normal.  ?    Nose: Nose normal.  ?   Mouth/Throat:  ?   Mouth: Mucous membranes are moist.  ?   Pharynx: Oropharynx is clear.  ?Eyes:  ?   General: No scleral icterus.    ?   Right eye: No discharge.     ?   Left eye: No discharge.  ?   Extraocular Movements: Extraocular movements intact.  ?   Conjunctiva/sclera: Conjunctivae normal.  ?   Pupils: Pupils are equal, round, and reactive to light.  ?Cardiovascular:  ?   Rate and Rhythm: Normal rate and regular rhythm.  ?   Pulses: Normal pulses.  ?   Heart sounds: Normal heart sounds. No murmur heard. ?  No friction rub. No gallop.  ?Pulmonary:  ?   Effort: Pulmonary effort is normal. No respiratory distress.  ?   Breath sounds: Normal breath sounds. No stridor. No wheezing, rhonchi or rales.  ?Chest:  ?   Chest wall: No tenderness.  ?Musculoskeletal:     ?   General: Normal range of motion.  ?   Cervical back: Normal range of motion and neck supple.  ?Skin: ?   General: Skin is warm and dry.  ?   Capillary Refill: Capillary refill takes  less than 2 seconds.  ?   Coloration: Skin is not jaundiced or pale.  ?   Findings: No bruising, erythema, lesion or rash.  ?Neurological:  ?   General: No focal deficit present.  ?   Mental Status: She is alert and oriented to person, place, and time. Mental status is at baseline.  ?Psychiatric:     ?   Mood and Affect: Mood normal.     ?   Behavior: Behavior normal.     ?   Thought Content: Thought content normal.     ?   Judgment: Judgment normal.  ? ? ?Results for orders placed or performed in visit on 05/28/21  ?Urine Culture  ? Specimen: Urine  ? UR  ?Result Value Ref Range  ? Urine Culture, Routine Final report   ? Organism ID, Bacteria No growth   ?Microscopic Examination  ? Urine  ?Result Value Ref Range  ? WBC, UA 0-5 0 - 5 /hpf  ? RBC 0-2 0 - 2 /hpf  ? Epithelial Cells (non renal) 0-10 0 - 10 /hpf  ? Bacteria, UA Few (A) None seen/Few  ?Urinalysis, Routine w reflex microscopic  ?Result Value Ref Range  ? Specific Gravity, UA 1.015 1.005 -  1.030  ? pH, UA 7.0 5.0 - 7.5  ? Color, UA Yellow Yellow  ? Appearance Ur Clear Clear  ? Leukocytes,UA 1+ (A) Negative  ? Protein,UA Negative Negative/Trace  ? Glucose, UA Negative Negative  ? Ketones, UA Negative Negative  ? RBC, UA 1+ (A) Negative  ? Bilirubin, UA Negative Negative  ? Urobilinogen, Ur 0.2 0.2 - 1.0 mg/dL  ? Nitrite, UA Negative Negative  ? Microscopic Examination See below:   ?Basic metabolic panel  ?Result Value Ref Range  ? Glucose 90 70 - 99 mg/dL  ? BUN 16 8 - 27 mg/dL  ? Creatinine, Ser 0.78 0.57 - 1.00 mg/dL  ? eGFR 82 >59 mL/min/1.73  ? BUN/Creatinine Ratio 21 12 - 28  ? Sodium 139 134 - 144 mmol/L  ? Potassium 4.3 3.5 - 5.2 mmol/L  ? Chloride 100 96 - 106 mmol/L  ? CO2 24 20 - 29 mmol/L  ? Calcium 9.8 8.7 - 10.3 mg/dL  ? ?   ?Assessment & Plan:  ? ?Problem List Items Addressed This Visit   ? ?  ? Cardiovascular and Mediastinum  ? HTN (hypertension)  ?  Will add HCTZ but BP beautiful. Will cut lisinopril in 1/2 and add HCTZ. Recheck 1 month.  ?  ?  ? Relevant Medications  ? hydrochlorothiazide (HYDRODIURIL) 25 MG tablet  ? lisinopril (ZESTRIL) 20 MG tablet  ? ?Other Visit Diagnoses   ? ? Peripheral edema    -  Primary  ? Will treat with compression hose and start HCTZ. Call with any concerns.   ? Relevant Orders  ? Comprehensive metabolic panel  ? CBC with Differential/Platelet  ? B Nat Peptide  ? ?  ?  ? ?Follow up plan: ?Return in about 4 weeks (around 07/15/2021). ? ? ? ? ? ?

## 2021-06-17 NOTE — Assessment & Plan Note (Signed)
Will add HCTZ but BP beautiful. Will cut lisinopril in 1/2 and add HCTZ. Recheck 1 month.  ?

## 2021-06-18 LAB — CBC WITH DIFFERENTIAL/PLATELET
Basophils Absolute: 0 10*3/uL (ref 0.0–0.2)
Basos: 1 %
EOS (ABSOLUTE): 0.1 10*3/uL (ref 0.0–0.4)
Eos: 2 %
Hematocrit: 36.1 % (ref 34.0–46.6)
Hemoglobin: 11.7 g/dL (ref 11.1–15.9)
Immature Grans (Abs): 0 10*3/uL (ref 0.0–0.1)
Immature Granulocytes: 0 %
Lymphocytes Absolute: 2.2 10*3/uL (ref 0.7–3.1)
Lymphs: 32 %
MCH: 27.7 pg (ref 26.6–33.0)
MCHC: 32.4 g/dL (ref 31.5–35.7)
MCV: 85 fL (ref 79–97)
Monocytes Absolute: 0.5 10*3/uL (ref 0.1–0.9)
Monocytes: 7 %
Neutrophils Absolute: 4 10*3/uL (ref 1.4–7.0)
Neutrophils: 58 %
Platelets: 240 10*3/uL (ref 150–450)
RBC: 4.23 x10E6/uL (ref 3.77–5.28)
RDW: 14.1 % (ref 11.7–15.4)
WBC: 6.9 10*3/uL (ref 3.4–10.8)

## 2021-06-18 LAB — COMPREHENSIVE METABOLIC PANEL
ALT: 11 IU/L (ref 0–32)
AST: 13 IU/L (ref 0–40)
Albumin/Globulin Ratio: 1.5 (ref 1.2–2.2)
Albumin: 4.2 g/dL (ref 3.8–4.8)
Alkaline Phosphatase: 85 IU/L (ref 44–121)
BUN/Creatinine Ratio: 22 (ref 12–28)
BUN: 18 mg/dL (ref 8–27)
Bilirubin Total: <0.2 mg/dL (ref 0.0–1.2)
CO2: 21 mmol/L (ref 20–29)
Calcium: 9.2 mg/dL (ref 8.7–10.3)
Chloride: 106 mmol/L (ref 96–106)
Creatinine, Ser: 0.82 mg/dL (ref 0.57–1.00)
Globulin, Total: 2.8 g/dL (ref 1.5–4.5)
Glucose: 81 mg/dL (ref 70–99)
Potassium: 4.4 mmol/L (ref 3.5–5.2)
Sodium: 141 mmol/L (ref 134–144)
Total Protein: 7 g/dL (ref 6.0–8.5)
eGFR: 77 mL/min/{1.73_m2} (ref >59)

## 2021-06-18 LAB — BRAIN NATRIURETIC PEPTIDE: BNP: 24.9 pg/mL (ref 0.0–100.0)

## 2021-07-16 ENCOUNTER — Ambulatory Visit: Payer: Medicare Other | Admitting: Family Medicine

## 2021-07-23 ENCOUNTER — Ambulatory Visit (INDEPENDENT_AMBULATORY_CARE_PROVIDER_SITE_OTHER): Payer: Medicare Other | Admitting: Family Medicine

## 2021-07-23 ENCOUNTER — Encounter: Payer: Self-pay | Admitting: Family Medicine

## 2021-07-23 VITALS — BP 100/60 | HR 63 | Temp 98.4°F | Wt 159.4 lb

## 2021-07-23 DIAGNOSIS — I10 Essential (primary) hypertension: Secondary | ICD-10-CM

## 2021-07-23 MED ORDER — HYDROCHLOROTHIAZIDE 25 MG PO TABS: 12.5000 mg | ORAL_TABLET | Freq: Every day | ORAL | 1 refills | Status: DC

## 2021-07-23 NOTE — Progress Notes (Signed)
? ?BP 100/60   Pulse 63   Temp 98.4 ?F (36.9 ?C)   Wt 159 lb 6.4 oz (72.3 kg)   SpO2 98%   BMI 30.62 kg/m?   ? ?Subjective:  ? ? Patient ID: Vanessa Casey, female    DOB: 1950-09-22, 71 y.o.   MRN: [id number] ? ?HPI: ?Vanessa Casey is a 71 y.o. female ? ?Chief Complaint  ?Patient presents with  ? Edema  ?  Patient states the swelling on her legs is gone, feels much better.   ? ?HYPERTENSION ?Hypertension status: controlled  ?Satisfied with current treatment? yes ?Duration of hypertension: chronic ?BP monitoring frequency:  not checking ?BP medication side effects:  no ?Medication compliance: excellent compliance ?Previous BP meds:lisinopril, HCTZ ?Aspirin: no ?Recurrent headaches: no ?Visual changes: no ?Palpitations: no ?Dyspnea: no ?Chest pain: no ?Lower extremity edema: no ?Dizzy/lightheaded: no ? ? ?Relevant past medical, surgical, family and social history reviewed and updated as indicated. Interim medical history since our last visit reviewed. ?Allergies and medications reviewed and updated. ? ?Review of Systems  ?Constitutional: Negative.   ?Respiratory: Negative.    ?Cardiovascular: Negative.   ?Gastrointestinal: Negative.   ?Musculoskeletal: Negative.   ?Psychiatric/Behavioral: Negative.    ? ?Per HPI unless specifically indicated above ? ?   ?Objective:  ?  ?BP 100/60   Pulse 63   Temp 98.4 ?F (36.9 ?C)   Wt 159 lb 6.4 oz (72.3 kg)   SpO2 98%   BMI 30.62 kg/m?   ?Wt Readings from Last 3 Encounters:  ?07/23/21 159 lb 6.4 oz (72.3 kg)  ?06/17/21 162 lb 6.4 oz (73.7 kg)  ?05/28/21 160 lb 9.6 oz (72.8 kg)  ?  ?Physical Exam ?Vitals and nursing note reviewed.  ?Constitutional:   ?   General: She is not in acute distress. ?   Appearance: Normal appearance. She is not ill-appearing, toxic-appearing or diaphoretic.  ?HENT:  ?   Head: Normocephalic and atraumatic.  ?   Right Ear: External ear normal.  ?   Left Ear: External ear normal.  ?   Nose: Nose normal.  ?   Mouth/Throat:  ?   Mouth: Mucous membranes  are moist.  ?   Pharynx: Oropharynx is clear.  ?Eyes:  ?   General: No scleral icterus.    ?   Right eye: No discharge.     ?   Left eye: No discharge.  ?   Extraocular Movements: Extraocular movements intact.  ?   Conjunctiva/sclera: Conjunctivae normal.  ?   Pupils: Pupils are equal, round, and reactive to light.  ?Cardiovascular:  ?   Rate and Rhythm: Normal rate and regular rhythm.  ?   Pulses: Normal pulses.  ?   Heart sounds: Normal heart sounds. No murmur heard. ?  No friction rub. No gallop.  ?Pulmonary:  ?   Effort: Pulmonary effort is normal. No respiratory distress.  ?   Breath sounds: Normal breath sounds. No stridor. No wheezing, rhonchi or rales.  ?Chest:  ?   Chest wall: No tenderness.  ?Musculoskeletal:     ?   General: Normal range of motion.  ?   Cervical back: Normal range of motion and neck supple.  ?   Right lower leg: No edema.  ?   Left lower leg: No edema.  ?Skin: ?   General: Skin is warm and dry.  ?   Capillary Refill: Capillary refill takes less than 2 seconds.  ?   Coloration: Skin is not jaundiced or pale.  ?  Findings: No bruising, erythema, lesion or rash.  ?Neurological:  ?   General: No focal deficit present.  ?   Mental Status: She is alert and oriented to person, place, and time. Mental status is at baseline.  ?Psychiatric:     ?   Mood and Affect: Mood normal.     ?   Behavior: Behavior normal.     ?   Thought Content: Thought content normal.     ?   Judgment: Judgment normal.  ? ? ?Results for orders placed or performed in visit on 06/17/21  ?Comprehensive metabolic panel  ?Result Value Ref Range  ? Glucose 81 70 - 99 mg/dL  ? BUN 18 8 - 27 mg/dL  ? Creatinine, Ser 0.82 0.57 - 1.00 mg/dL  ? eGFR 77 >59 mL/min/1.73  ? BUN/Creatinine Ratio 22 12 - 28  ? Sodium 141 134 - 144 mmol/L  ? Potassium 4.4 3.5 - 5.2 mmol/L  ? Chloride 106 96 - 106 mmol/L  ? CO2 21 20 - 29 mmol/L  ? Calcium 9.2 8.7 - 10.3 mg/dL  ? Total Protein 7.0 6.0 - 8.5 g/dL  ? Albumin 4.2 3.8 - 4.8 g/dL  ? Globulin,  Total 2.8 1.5 - 4.5 g/dL  ? Albumin/Globulin Ratio 1.5 1.2 - 2.2  ? Bilirubin Total <0.2 0.0 - 1.2 mg/dL  ? Alkaline Phosphatase 85 44 - 121 IU/L  ? AST 13 0 - 40 IU/L  ? ALT 11 0 - 32 IU/L  ?CBC with Differential/Platelet  ?Result Value Ref Range  ? WBC 6.9 3.4 - 10.8 x10E3/uL  ? RBC 4.23 3.77 - 5.28 x10E6/uL  ? Hemoglobin 11.7 11.1 - 15.9 g/dL  ? Hematocrit 36.1 34.0 - 46.6 %  ? MCV 85 79 - 97 fL  ? MCH 27.7 26.6 - 33.0 pg  ? MCHC 32.4 31.5 - 35.7 g/dL  ? RDW 14.1 11.7 - 15.4 %  ? Platelets 240 150 - 450 x10E3/uL  ? Neutrophils 58 Not Estab. %  ? Lymphs 32 Not Estab. %  ? Monocytes 7 Not Estab. %  ? Eos 2 Not Estab. %  ? Basos 1 Not Estab. %  ? Neutrophils Absolute 4.0 1.4 - 7.0 x10E3/uL  ? Lymphocytes Absolute 2.2 0.7 - 3.1 x10E3/uL  ? Monocytes Absolute 0.5 0.1 - 0.9 x10E3/uL  ? EOS (ABSOLUTE) 0.1 0.0 - 0.4 x10E3/uL  ? Basophils Absolute 0.0 0.0 - 0.2 x10E3/uL  ? Immature Granulocytes 0 Not Estab. %  ? Immature Grans (Abs) 0.0 0.0 - 0.1 x10E3/uL  ?B Nat Peptide  ?Result Value Ref Range  ? BNP 24.9 0.0 - 100.0 pg/mL  ? ?   ?Assessment & Plan:  ? ?Problem List Items Addressed This Visit   ? ?  ? Cardiovascular and Mediastinum  ? HTN (hypertension) - Primary  ?  BP running low. Will cut her HCTZ in 1/2 and continue 1m lisinopril. Recheck 5 months at physical. Call with any concerns.  ? ?  ?  ? Relevant Medications  ? hydrochlorothiazide (HYDRODIURIL) 25 MG tablet  ? Other Relevant Orders  ? Basic metabolic panel  ?  ? ?Follow up plan: ?Return End of September Physical. ? ? ? ? ? ?

## 2021-07-23 NOTE — Assessment & Plan Note (Signed)
BP running low. Will cut her HCTZ in 1/2 and continue '10mg'$  lisinopril. Recheck 5 months at physical. Call with any concerns.  ?

## 2021-07-24 LAB — BASIC METABOLIC PANEL
BUN/Creatinine Ratio: 24 (ref 12–28)
BUN: 21 mg/dL (ref 8–27)
CO2: 24 mmol/L (ref 20–29)
Calcium: 9.4 mg/dL (ref 8.7–10.3)
Chloride: 101 mmol/L (ref 96–106)
Creatinine, Ser: 0.89 mg/dL (ref 0.57–1.00)
Glucose: 100 mg/dL — ABNORMAL HIGH (ref 70–99)
Potassium: 3.8 mmol/L (ref 3.5–5.2)
Sodium: 142 mmol/L (ref 134–144)
eGFR: 70 mL/min/{1.73_m2} (ref >59)

## 2021-08-15 ENCOUNTER — Ambulatory Visit: Payer: Self-pay

## 2021-08-15 NOTE — Telephone Encounter (Signed)
Summary: Discuss Lisinopril   Patient called in to find out how she needs to take her Lisinopril. She currently gets the 20 mg tablets but wanted to know if she should get the '10mg'$  instead? Please advise patient further as she only has a few pills left     Called pt and LM on VM to call back.

## 2021-08-15 NOTE — Telephone Encounter (Signed)
Routing to provider to advise.  

## 2021-08-15 NOTE — Telephone Encounter (Addendum)
  Chief Complaint: dose change Symptoms: NA Frequency: NA Pertinent Negatives: NA Disposition: '[]'$ ED /'[]'$ Urgent Care (no appt availability in office) / '[]'$ Appointment(In office/virtual)/ '[]'$  [place] [facility]/ '[]'$ Home Care/ '[]'$ Refused Recommended Disposition /'[]'$ [place] [facility]/ '[x]'$  Follow-up with PCP Additional Notes: Pt is wanting PCP to send in new rx for Lisinopril '10mg'$  instead of getting '20mg'$  and cutting in half. Current rx says '20mg'$  take 0.'5mg'$ . pt only has 2 pills left. Advised her I will send to PCP to see if can be sent in.   Reason for Disposition  [1] Prescription not at pharmacy AND [2] was prescribed by PCP recently (Exception: triager has access to EMR and prescription is recorded there. Go to Home Care and confirm for pharmacy.)  Answer Assessment - Initial Assessment Questions 1. NAME of MEDICATION: "What medicine are you calling about?"     Lisinopril  2. QUESTION: "What is your question?" (e.g., double dose of medicine, side effect)     Wanting new rx sent in for '10mg'$  instead of 20 and splitting in half 3. PRESCRIBING HCP: "Who prescribed it?" Reason: if prescribed by specialist, call should be referred to that group.     Dr. Wynetta Emery  Protocols used: Medication Question Call-A-AH

## 2021-08-15 NOTE — Telephone Encounter (Signed)
Called pt and LM on VM to call back.

## 2021-08-16 MED ORDER — LISINOPRIL 10 MG PO TABS: 10.0000 mg | ORAL_TABLET | Freq: Every day | ORAL | 1 refills | Status: DC

## 2021-08-16 NOTE — Addendum Note (Signed)
Addended by: Georgina Peer on: 08/16/2021 10:26 AM   Modules accepted: Orders

## 2021-08-16 NOTE — Telephone Encounter (Signed)
Rx sent in

## 2021-08-16 NOTE — Telephone Encounter (Signed)
Per Dr. Durenda Age note she should be taking '10mg'$  which is half of the '20mg'$  tablet.

## 2021-08-16 NOTE — Telephone Encounter (Signed)
Patient does not want to half to cut the tablet in half anymore. She would like to take just a 10 mg tablet daily. Can this dose be sent in for her?

## 2021-08-16 NOTE — Addendum Note (Signed)
Addended by: Valerie Roys on: 08/16/2021 01:35 PM   Modules accepted: Orders

## 2021-08-19 ENCOUNTER — Other Ambulatory Visit: Payer: Self-pay | Admitting: Family Medicine

## 2021-08-20 NOTE — Telephone Encounter (Signed)
Requested Prescriptions  Pending Prescriptions Disp Refills  . hydrochlorothiazide (HYDRODIURIL) 25 MG tablet [Pharmacy Med Name: HYDROCHLOROTHIAZIDE '25MG'$  TABLETS] 90 tablet 0    Sig: TAKE 1 TABLET(25 MG) BY MOUTH DAILY     Cardiovascular: Diuretics - Thiazide Passed - 08/19/2021  8:33 AM      Passed - Cr in normal range and within 180 days    Creatinine, Ser  Date Value Ref Range Status  07/23/2021 0.89 0.57 - 1.00 mg/dL Final         Passed - K in normal range and within 180 days    Potassium  Date Value Ref Range Status  07/23/2021 3.8 3.5 - 5.2 mmol/L Final         Passed - Na in normal range and within 180 days    Sodium  Date Value Ref Range Status  07/23/2021 142 134 - 144 mmol/L Final         Passed - Last BP in normal range    BP Readings from Last 1 Encounters:  07/23/21 100/60         Passed - Valid encounter within last 6 months    Recent Outpatient Visits          4 weeks ago Primary hypertension   [facility] [place], [place], DO   2 months ago Peripheral edema   [facility] [place], [place], DO   2 months ago Primary hypertension   [facility] [place], [place], DO   4 months ago Nasal congestion   [place], Megan P, DO   1 year ago Routine general medical examination at a health care facility   [place], Barb Merino, DO      Future Appointments            In 1 month Teodora Medici, [facility], [place]   In 2 months [facility], [place], [place]   In 3 months [place], Barb Merino, DO [facility], [facility]

## 2021-09-16 ENCOUNTER — Other Ambulatory Visit: Payer: Self-pay | Admitting: Family Medicine

## 2021-09-19 ENCOUNTER — Telehealth: Payer: Self-pay | Admitting: Family Medicine

## 2021-09-19 NOTE — Telephone Encounter (Signed)
Patient dropped off reasonable accomodation form to be filled out by provider. Note included on why she needs it. Form placed in provider's folder.

## 2021-09-19 NOTE — Telephone Encounter (Signed)
Patient dropped off form for emotional support dog.

## 2021-09-24 ENCOUNTER — Ambulatory Visit: Payer: Medicare Other | Admitting: Internal Medicine

## 2021-09-26 NOTE — Telephone Encounter (Signed)
I will look at paperwork, but we don't usually sign off on emotional support animals.

## 2021-09-27 NOTE — Telephone Encounter (Signed)
I completely understand where she is coming from with her paperwork- but based on the form, I can't fill it out as we don't do emotional support animals. I'm happy to give her the names of some counselors who would be able to do that for her, but unfortunately I can't fill out the form

## 2021-09-27 NOTE — Telephone Encounter (Signed)
Patient is okay with a list of counselors. Will mail to patient when printed.

## 2021-09-27 NOTE — Telephone Encounter (Signed)
Attempted to contact patient to inform of provider advise, NA LVM for patient to call back.

## 2021-09-27 NOTE — Telephone Encounter (Signed)
Patient returning call  Called office and sent cma a direct message and was unable to receive a response  Please fu w/ patient

## 2021-10-01 ENCOUNTER — Other Ambulatory Visit: Payer: Self-pay | Admitting: Family Medicine

## 2021-10-02 NOTE — Telephone Encounter (Signed)
last RF 08/20/21 #90   Requested Prescriptions  Refused Prescriptions Disp Refills  . hydrochlorothiazide (HYDRODIURIL) 25 MG tablet [Pharmacy Med Name: HYDROCHLOROTHIAZIDE '25MG'$  TABLETS] 90 tablet 0    Sig: TAKE 1 TABLET(25 MG) BY MOUTH DAILY     Cardiovascular: Diuretics - Thiazide Passed - 10/01/2021  8:37 AM      Passed - Cr in normal range and within 180 days    Creatinine, Ser  Date Value Ref Range Status  07/23/2021 0.89 0.57 - 1.00 mg/dL Final         Passed - K in normal range and within 180 days    Potassium  Date Value Ref Range Status  07/23/2021 3.8 3.5 - 5.2 mmol/L Final         Passed - Na in normal range and within 180 days    Sodium  Date Value Ref Range Status  07/23/2021 142 134 - 144 mmol/L Final         Passed - Last BP in normal range    BP Readings from Last 1 Encounters:  07/23/21 100/60         Passed - Valid encounter within last 6 months    Recent Outpatient Visits          2 months ago Primary hypertension   [place], [place], DO   3 months ago Peripheral edema   [facility] [place], [place], DO   4 months ago Primary hypertension   [facility] [place], [place], DO   5 months ago Nasal congestion   [place], Megan P, DO   1 year ago Routine general medical examination at a health care facility   [place], Barb Merino, DO      Future Appointments            In 1 month [facility], [place], MD [place]   In 2 months [place], Barb Merino, DO [facility], [facility]

## 2021-10-14 ENCOUNTER — Ambulatory Visit: Payer: Self-pay

## 2021-10-14 ENCOUNTER — Ambulatory Visit: Payer: Self-pay | Admitting: *Deleted

## 2021-10-14 NOTE — Telephone Encounter (Signed)
Pt has already spoken to another triage RN. Please see other encounter.

## 2021-10-14 NOTE — Telephone Encounter (Signed)
Patient called, left VM to return the call to the office to discuss symptoms with a nurse.    Summary: mild back pain    Patient called in, has mild back pain and is requesting med/ibuprofen to be sent, [facility] [place], [address] AT [address]  Phone: [phone]  Fax: [phone]

## 2021-10-14 NOTE — Telephone Encounter (Signed)
  Chief Complaint: requesting ibuprofen 600 mg as prescribed in the past for back pain  Symptoms: low back pain from moving objects in her home cleaning. No N/T Frequency: 1 week ago  Pertinent Negatives: Patient denies severe pain no N/T in legs.  Disposition: '[]'$ ED /'[]'$ Urgent Care (no appt availability in office) / '[]'$ Appointment(In office/virtual)/ '[]'$  [place] [facility]/ '[]'$ Home Care/ '[]'$ Refused Recommended Disposition /'[]'$ [place] Mobile [facility]/ '[x]'$  Follow-up with PCP Additional Notes:   Requesting medication that has been prescribed in the past . Please advise .  See previous NT encounters. Please send to [facility] drug store in [place]   Reason for Disposition  Back pain present > 2 weeks    Less than 2 weeks  Answer Assessment - Initial Assessment Questions 1. ONSET: "When did the pain begin?"      Approx. 1 week ago  2. LOCATION: "Where does it hurt?" (upper, mid or lower back)     Low back  3. SEVERITY: "How bad is the pain?"  (e.g., Scale 1-10; mild, moderate, or severe)   - MILD (1-3): Doesn't interfere with normal activities.    - MODERATE (4-7): Interferes with normal activities or awakens from sleep.    - SEVERE (8-10): Excruciating pain, unable to do any normal activities.      Mild  4. PATTERN: "Is the pain constant?" (e.g., yes, no; constant, intermittent)      na 5. RADIATION: "Does the pain shoot into your legs or somewhere else?"     no 6. CAUSE:  "What do you think is causing the back pain?"      Was moving objects in her house and back is sore 7. BACK OVERUSE:  "Any recent lifting of heavy objects, strenuous work or exercise?"     Strenuous work  8. MEDICINES: "What have you taken so far for the pain?" (e.g., nothing, acetaminophen, NSAIDS)     Nothing  9. NEUROLOGIC SYMPTOMS: "Do you have any weakness, numbness, or problems with bowel/bladder control?"     no 10. OTHER SYMPTOMS: "Do you have any other symptoms?" (e.g., fever, abdomen pain, burning with  urination, blood in urine)       Low back discomfort 11. PREGNANCY: "Is there any chance you are pregnant?" "When was your last menstrual period?"       na  Protocols used: Back Pain-A-AH

## 2021-10-14 NOTE — Telephone Encounter (Signed)
Patient called, left VM to return the call to the office to discuss symptoms with a nurse.  Summary: mild back pain   Patient called in, has mild back pain and is requesting med/ibuprofen to be sent, [facility] [place], [address] AT [place]  Phone: [phone]  Fax: [phone]

## 2021-10-15 MED ORDER — IBUPROFEN 600 MG PO TABS: 600.0000 mg | ORAL_TABLET | Freq: Three times a day (TID) | ORAL | 0 refills | Status: DC | PRN

## 2021-10-15 NOTE — Addendum Note (Signed)
Addended by: Valerie Roys on: 10/15/2021 03:16 PM   Modules accepted: Orders

## 2021-10-15 NOTE — Telephone Encounter (Signed)
Attempted to contact patient, NA LVM for patient to call back. Patient has not recently been seen for back pain, will need appointment for medication.  OK for [facility] to schedule patient if she calls back.

## 2021-10-29 ENCOUNTER — Encounter: Payer: Medicare Other | Admitting: Family Medicine

## 2021-10-31 ENCOUNTER — Other Ambulatory Visit: Payer: Self-pay | Admitting: Family Medicine

## 2021-10-31 NOTE — Telephone Encounter (Signed)
Requested by interface surescripts. Last refill 08/20/21 #90 . Requesting too soon. Requested Prescriptions  Refused Prescriptions Disp Refills  . hydrochlorothiazide (HYDRODIURIL) 25 MG tablet [Pharmacy Med Name: HYDROCHLOROTHIAZIDE '25MG'$  TABLETS] 90 tablet 0    Sig: TAKE 1 TABLET(25 MG) BY MOUTH DAILY     Cardiovascular: Diuretics - Thiazide Passed - 10/31/2021  3:12 AM      Passed - Cr in normal range and within 180 days    Creatinine, Ser  Date Value Ref Range Status  07/23/2021 0.89 0.57 - 1.00 mg/dL Final         Passed - K in normal range and within 180 days    Potassium  Date Value Ref Range Status  07/23/2021 3.8 3.5 - 5.2 mmol/L Final         Passed - Na in normal range and within 180 days    Sodium  Date Value Ref Range Status  07/23/2021 142 134 - 144 mmol/L Final         Passed - Last BP in normal range    BP Readings from Last 1 Encounters:  07/23/21 100/60         Passed - Valid encounter within last 6 months    Recent Outpatient Visits          3 months ago Primary hypertension   [place], [place], DO   4 months ago Peripheral edema   [facility] [place], [place], DO   5 months ago Primary hypertension   [facility] [place], [place], DO   6 months ago Nasal congestion   [place], Megan P, DO   1 year ago Routine general medical examination at a health care facility   [place], Barb Merino, DO      Future Appointments            In 1 week [facility], [place], MD [place]   In 1 month [place], Barb Merino, DO [facility], [facility]

## 2021-11-07 ENCOUNTER — Ambulatory Visit (INDEPENDENT_AMBULATORY_CARE_PROVIDER_SITE_OTHER): Payer: Medicare Other | Admitting: Dermatology

## 2021-11-07 ENCOUNTER — Encounter: Payer: Self-pay | Admitting: Dermatology

## 2021-11-07 DIAGNOSIS — L578 Other skin changes due to chronic exposure to nonionizing radiation: Secondary | ICD-10-CM | POA: Diagnosis not present

## 2021-11-07 DIAGNOSIS — L570 Actinic keratosis: Secondary | ICD-10-CM

## 2021-11-07 DIAGNOSIS — C4491 Basal cell carcinoma of skin, unspecified: Secondary | ICD-10-CM

## 2021-11-07 DIAGNOSIS — L814 Other melanin hyperpigmentation: Secondary | ICD-10-CM

## 2021-11-07 DIAGNOSIS — C44319 Basal cell carcinoma of skin of other parts of face: Secondary | ICD-10-CM | POA: Diagnosis not present

## 2021-11-07 DIAGNOSIS — D230 Other benign neoplasm of skin of lip: Secondary | ICD-10-CM | POA: Diagnosis not present

## 2021-11-07 DIAGNOSIS — L821 Other seborrheic keratosis: Secondary | ICD-10-CM | POA: Diagnosis not present

## 2021-11-07 DIAGNOSIS — L820 Inflamed seborrheic keratosis: Secondary | ICD-10-CM

## 2021-11-07 DIAGNOSIS — D492 Neoplasm of unspecified behavior of bone, soft tissue, and skin: Secondary | ICD-10-CM

## 2021-11-07 HISTORY — DX: Basal cell carcinoma of skin, unspecified: C44.91

## 2021-11-07 NOTE — Progress Notes (Signed)
Follow-Up Visit   Subjective  Vanessa Casey is a 71 y.o. female who presents for the following: lesions (Forehead and breasts. C/O dark, rough, itchy spots. Bothersome).  The patient has spots, moles and lesions to be evaluated, some may be new or changing and the patient has concerns that these could be cancer.   The following portions of the chart were reviewed this encounter and updated as appropriate:  Tobacco  Allergies  Meds  Problems  Med Hx  Surg Hx  Fam Hx      Review of Systems: No other skin or systemic complaints except as noted in HPI or Assessment and Plan.   Objective  Well appearing patient in no apparent distress; mood and affect are within normal limits.  A focused examination was performed including face, chest. Relevant physical exam findings are noted in the Assessment and Plan.  left superior breast x1, right inferior breast x1, right forearm x1 (3) Erythematous keratotic or waxy stuck-on papule or plaque.  Upper Mid Forehead 0.6 cm pink papule with hairpin vessels      left upper forehead at hair line 0.5 cm pink papule      Left Upper Cutaneous Lip 0.7 cm pink papule     Left Forearm - Posterior x1 Erythematous thin papules/macules with gritty scale.    Assessment & Plan  Inflamed seborrheic keratosis (3) left superior breast x1, right inferior breast x1, right forearm x1  Symptomatic, irritating, patient would like treated.  Destruction of lesion - left superior breast x1, right inferior breast x1, right forearm x1  Destruction method: cryotherapy   Informed consent: discussed and consent obtained   Lesion destroyed using liquid nitrogen: Yes   Outcome: patient tolerated procedure well with no complications   Post-procedure details: wound care instructions given   Additional details:  Prior to procedure, discussed risks of blister formation, small wound, skin dyspigmentation, or rare scar following cryotherapy. Recommend  Vaseline ointment to treated areas while healing.   Neoplasm of skin (3) Upper Mid Forehead  Skin / nail biopsy Type of biopsy: tangential   Informed consent: discussed and consent obtained   Anesthesia: the lesion was anesthetized in a standard fashion   Anesthesia comment:  Area prepped with alcohol Anesthetic:  1% lidocaine w/ epinephrine 1-100,000 buffered w/ 8.4% NaHCO3 Instrument used: flexible razor blade   Hemostasis achieved with: pressure, aluminum chloride and electrodesiccation   Outcome: patient tolerated procedure well   Post-procedure details: wound care instructions given   Post-procedure details comment:  Ointment and small bandage applied  Specimen 1 - Surgical pathology Differential Diagnosis: R/O BCC > SCC > almelanotic melanoma  Check Margins: No  left upper forehead at hair line  Skin / nail biopsy Type of biopsy: tangential   Informed consent: discussed and consent obtained   Anesthesia: the lesion was anesthetized in a standard fashion   Anesthesia comment:  Area prepped with alcohol Anesthetic:  1% lidocaine w/ epinephrine 1-100,000 buffered w/ 8.4% NaHCO3 Instrument used: flexible razor blade   Hemostasis achieved with: pressure, aluminum chloride and electrodesiccation   Outcome: patient tolerated procedure well   Post-procedure details: wound care instructions given   Post-procedure details comment:  Ointment and small bandage applied  Specimen 2 - Surgical pathology Differential Diagnosis: R/O BCC  Check Margins: No  Left Upper Cutaneous Lip  Skin / nail biopsy Type of biopsy: tangential   Informed consent: discussed and consent obtained   Anesthesia: the lesion was anesthetized in a standard fashion  Anesthesia comment:  Area prepped with alcohol Anesthetic:  1% lidocaine w/ epinephrine 1-100,000 buffered w/ 8.4% NaHCO3 Instrument used: flexible razor blade   Hemostasis achieved with: pressure, aluminum chloride and electrodesiccation    Outcome: patient tolerated procedure well   Post-procedure details: wound care instructions given   Post-procedure details comment:  Ointment and small bandage applied  Specimen 3 - Surgical pathology Differential Diagnosis: R/O BCC  Check Margins: No  AK (actinic keratosis) Left Forearm - Posterior x1  Hypertrophic  Actinic keratoses are precancerous spots that appear secondary to cumulative UV radiation exposure/sun exposure over time. They are chronic with expected duration over 1 year. A portion of actinic keratoses will progress to squamous cell carcinoma of the skin. It is not possible to reliably predict which spots will progress to skin cancer and so treatment is recommended to prevent development of skin cancer.  Recommend daily broad spectrum sunscreen SPF 30+ to sun-exposed areas, reapply every 2 hours as needed.  Recommend staying in the shade or wearing long sleeves, sun glasses (UVA+UVB protection) and wide brim hats (4-inch brim around the entire circumference of the hat). Call for new or changing lesions.  Destruction of lesion - Left Forearm - Posterior x1  Destruction method: cryotherapy   Informed consent: discussed and consent obtained   Lesion destroyed using liquid nitrogen: Yes   Outcome: patient tolerated procedure well with no complications   Post-procedure details: wound care instructions given   Additional details:  Prior to procedure, discussed risks of blister formation, small wound, skin dyspigmentation, or rare scar following cryotherapy. Recommend Vaseline ointment to treated areas while healing.    Actinic Damage - chronic, secondary to cumulative UV radiation exposure/sun exposure over time - diffuse scaly erythematous macules with underlying dyspigmentation - Recommend daily broad spectrum sunscreen SPF 30+ to sun-exposed areas, reapply every 2 hours as needed.  - Recommend staying in the shade or wearing long sleeves, sun glasses (UVA+UVB  protection) and wide brim hats (4-inch brim around the entire circumference of the hat). - Call for new or changing lesions.  Seborrheic Keratoses - Stuck-on, waxy, tan-brown papules and/or plaques  - Benign-appearing - Discussed benign etiology and prognosis. - Observe - Call for any changes  Lentigines - Scattered tan macules - Due to sun exposure - Benign-appering, observe - Recommend daily broad spectrum sunscreen SPF 30+ to sun-exposed areas, reapply every 2 hours as needed. - Call for any changes  Return for TBSE, Next Available.  I, Emelia Salisbury, CMA, am acting as scribe for Forest Gleason, MD.  Documentation: I have reviewed the above documentation for accuracy and completeness, and I agree with the above.  Forest Gleason, MD

## 2021-11-07 NOTE — Patient Instructions (Addendum)
Cryotherapy Aftercare  Wash gently with soap and water everyday.   Apply Vaseline Jelly daily until healed.    Wound Care Instructions  Cleanse wound gently with soap and water once a day then pat dry with clean gauze. Apply a thin coat of Petrolatum (petroleum jelly, "Vaseline") over the wound (unless you have an allergy to this). We recommend that you use a new, sterile tube of Vaseline. Do not pick or remove scabs. Do not remove the yellow or white "healing tissue" from the base of the wound.  Cover the wound with fresh, clean, nonstick gauze and secure with paper tape. You may use Band-Aids in place of gauze and tape if the wound is small enough, but would recommend trimming much of the tape off as there is often too much. Sometimes Band-Aids can irritate the skin.  You should call the office for your biopsy report after 1 week if you have not already been contacted.  If you experience any problems, such as abnormal amounts of bleeding, swelling, significant bruising, significant pain, or evidence of infection, please call the office immediately.  FOR ADULT SURGERY PATIENTS: If you need something for pain relief you may take 1 extra strength Tylenol (acetaminophen) AND 2 Ibuprofen ('200mg'$  each) together every 4 hours as needed for pain. (do not take these if you are allergic to them or if you have a reason you should not take them.) Typically, you may only need pain medication for 1 to 3 days.   Recommend Niacinamide or Nicotinamide '500mg'$  twice per day to lower risk of non-melanoma skin cancer by approximately 25%. This is usually available at [employer].  Recommend taking Heliocare sun protection supplement daily in sunny weather for additional sun protection. For maximum protection on the sunniest days, you can take up to 2 capsules of regular Heliocare OR take 1 capsule of Heliocare Ultra. For prolonged exposure (such as a full day in the sun), you can repeat your dose of the supplement  4 hours after your first dose. Heliocare can be purchased at [employer], at some [facility] or at [email or web address].     Due to recent changes in healthcare laws, you may see results of your pathology and/or laboratory studies on MyChart before the doctors have had a chance to review them. We understand that in some cases there may be results that are confusing or concerning to you. Please understand that not all results are received at the same time and often the doctors may need to interpret multiple results in order to provide you with the best plan of care or course of treatment. Therefore, we ask that you please give [place] 2 business days to thoroughly review all your results before contacting the office for clarification. Should we see a critical lab result, you will be contacted sooner.   If You Need Anything After Your Visit  If you have any questions or concerns for your doctor, please call our main line at [phone] and press option 4 to reach your doctor's medical assistant. If no one answers, please leave a voicemail as directed and we will return your call as soon as possible. Messages left after 4 pm will be answered the following business day.   You may also send [place] a message via [place]. We typically respond to MyChart messages within 1-2 business days.  For prescription refills, please ask your pharmacy to contact our office. Our fax number is [phone].  If you have an urgent issue when the clinic  is closed that cannot wait until the next business day, you can page your doctor at the number below.    Please note that while we do our best to be available for urgent issues outside of office hours, we are not available 24/7.   If you have an urgent issue and are unable to reach [place], you may choose to seek medical care at your doctor's office, retail clinic, urgent care center, or emergency room.  If you have a medical emergency, please immediately call 911 or go to the  emergency department.  Pager Numbers  - Dr. Nehemiah Massed: [phone]  - Dr. Laurence Ferrari: [phone]  - Dr. Nicole Kindred: [phone]  In the event of inclement weather, please call our main line at [phone] for an update on the status of any delays or closures.  Dermatology Medication Tips: Please keep the boxes that topical medications come in in order to help keep track of the instructions about where and how to use these. Pharmacies typically print the medication instructions only on the boxes and not directly on the medication tubes.   If your medication is too expensive, please contact our office at [phone] option 4 or send [place] a message through [place].   We are unable to tell what your co-pay for medications will be in advance as this is different depending on your insurance coverage. However, we may be able to find a substitute medication at lower cost or fill out paperwork to get insurance to cover a needed medication.   If a prior authorization is required to get your medication covered by your insurance company, please allow [place] 1-2 business days to complete this process.  Drug prices often vary depending on where the prescription is filled and some pharmacies may offer cheaper prices.  The website www.goodrx.com contains coupons for medications through different pharmacies. The prices here do not account for what the cost may be with help from insurance (it may be cheaper with your insurance), but the website can give you the price if you did not use any insurance.  - You can print the associated coupon and take it with your prescription to the pharmacy.  - You may also stop by our office during regular business hours and pick up a GoodRx coupon card.  - If you need your prescription sent electronically to a different pharmacy, notify our office through [facility] or by phone at [phone] option 4.     Si Usted Necesita Algo Despus de Su Visita  Tambin puede  enviarnos un mensaje a travs de Pharmacist, community. Por lo general respondemos a los mensajes de MyChart en el transcurso de 1 a 2 das hbiles.  Para renovar recetas, por favor pida a su farmacia que se ponga en contacto con nuestra oficina. Harland Dingwall de fax es [place] [phone].  Si tiene un asunto urgente cuando la clnica est cerrada y que no puede esperar hasta el siguiente da hbil, puede llamar/localizar a su doctor(a) al nmero que aparece a continuacin.   Por favor, tenga en cuenta que aunque hacemos todo lo posible para estar disponibles para asuntos urgentes fuera del horario de [place], no estamos disponibles las 24 horas del da, los 7 das de la [place].   Si tiene un problema urgente y no puede comunicarse con nosotros, puede optar por buscar atencin mdica  en el consultorio de su doctor(a), en una clnica privada, en un centro de atencin urgente o en una sala de emergencias.  Si tiene Engineer, maintenance (IT)  mdica, por favor llame inmediatamente al 911 o vaya a la sala de emergencias.  Nmeros de bper  - Dr. Nehemiah Massed: [phone]  - Dra. Moye: [phone]  - Dra. Nicole Kindred: [phone]  En caso de inclemencias del [place], por favor llame a Johnsie Kindred principal al [phone] para una actualizacin sobre el [place] de cualquier retraso o cierre.  Consejos para la medicacin en dermatologa: Por favor, guarde las cajas en las que vienen los medicamentos de uso tpico para ayudarle a seguir las instrucciones sobre dnde y cmo usarlos. Las farmacias generalmente imprimen las instrucciones del medicamento slo en las cajas y no directamente en los tubos del [place].   Si su medicamento es muy caro, por favor, pngase en contacto con Zigmund Daniel llamando al [phone] y presione la opcin 4 o envenos un mensaje a travs de Pharmacist, community.   No podemos decirle cul ser su copago por los medicamentos por adelantado ya que esto es diferente dependiendo de la cobertura de su seguro.  Sin embargo, es posible que podamos encontrar un medicamento sustituto a Electrical engineer un formulario para que el seguro cubra el medicamento que se considera necesario.   Si se requiere una autorizacin previa para que su compaa de seguros [place] su medicamento, por favor permtanos de 1 a 2 das hbiles para completar este proceso.  Los precios de los medicamentos varan con frecuencia dependiendo del Environmental consultant de dnde se surte la receta y alguna farmacias pueden ofrecer precios ms baratos.  El sitio web www.goodrx.com tiene cupones para medicamentos de Airline pilot. Los precios aqu no tienen en cuenta lo que podra costar con la ayuda del seguro (puede ser ms barato con su seguro), pero el sitio web puede darle el precio si no utiliz Research scientist (physical sciences).  - Puede imprimir el cupn correspondiente y llevarlo con su receta a la farmacia.  - Tambin puede pasar por nuestra oficina durante el horario de atencin regular y Charity fundraiser una tarjeta de cupones de GoodRx.  - Si necesita que su receta se enve electrnicamente a una farmacia diferente, informe a nuestra oficina a travs de MyChart de [place] o por telfono llamando al [phone] y presione la opcin 4.

## 2021-11-13 ENCOUNTER — Telehealth: Payer: Self-pay

## 2021-11-13 ENCOUNTER — Encounter: Payer: Self-pay | Admitting: Dermatology

## 2021-11-13 DIAGNOSIS — C4491 Basal cell carcinoma of skin, unspecified: Secondary | ICD-10-CM

## 2021-11-13 HISTORY — DX: Basal cell carcinoma of skin, unspecified: C44.91

## 2021-11-13 NOTE — Telephone Encounter (Addendum)
Discussed results and recommendations with patient regarding biopsies. Patient verbalized understanding and would like time to decide how she would treatment. Patient states she will call us back to let us know.      ----- Message from Alfonso Patten, MD sent at 11/13/2021 10:39 AM EDT ----- 1. Skin , upper mid forehead BASAL CELL CARCINOMA, NODULAR PATTERN --> Mohs surgery  2. Skin , left upper forehead at hair line SUPERFICIAL BASAL CELL CARCINOMA  --> Mohs recommended, could consider scrape and burn in office if preferred since this is more superficial, but Mohs still has a higher cure rate and less likely it comes back  3. Skin , left upper cutaneous lip ANGIOFIBROMA "benign growth" no treatment needed  MAs please call with results and refer. Let me know if she has questions. Thank you!

## 2021-11-19 ENCOUNTER — Encounter: Payer: Medicare Other | Admitting: Dermatology

## 2021-11-20 ENCOUNTER — Telehealth: Payer: Self-pay

## 2021-11-20 NOTE — Telephone Encounter (Signed)
Had spoke with patient weeks prior about results to inform her of results and treatment recommendations. Patient had stated she would call us back to let us know what she would like to do.  Called patient today to follow up with her regarding treatment preference. She states she would prefer to have ED&C preformed to both areas that need treatment.   Routing provider to review and advise.

## 2021-11-20 NOTE — Telephone Encounter (Signed)
Please schedule patient for [facility] within the next 2-4 weeks. Thank you!

## 2021-11-20 NOTE — Telephone Encounter (Signed)
-----   Message from Alfonso Patten, MD sent at 11/13/2021 10:39 AM EDT ----- 1. Skin , upper mid forehead BASAL CELL CARCINOMA, NODULAR PATTERN --> Mohs surgery  2. Skin , left upper forehead at hair line SUPERFICIAL BASAL CELL CARCINOMA  --> Mohs recommended, could consider scrape and burn in office if preferred since this is more superficial, but Mohs still has a higher cure rate and less likely it comes back  3. Skin , left upper cutaneous lip ANGIOFIBROMA "benign growth" no treatment needed  MAs please call with results and refer. Let me know if she has questions. Thank you!

## 2021-11-26 NOTE — Telephone Encounter (Signed)
Called patient and scheduled for [facility] treatment.

## 2021-12-01 ENCOUNTER — Other Ambulatory Visit: Payer: Self-pay | Admitting: Family Medicine

## 2021-12-02 NOTE — Telephone Encounter (Signed)
Requested Prescriptions  Pending Prescriptions Disp Refills  . hydrochlorothiazide (HYDRODIURIL) 25 MG tablet [Pharmacy Med Name: HYDROCHLOROTHIAZIDE '25MG'$  TABLETS] 90 tablet 0    Sig: TAKE 1 TABLET(25 MG) BY MOUTH DAILY     Cardiovascular: Diuretics - Thiazide Passed - 12/01/2021  3:02 PM      Passed - Cr in normal range and within 180 days    Creatinine, Ser  Date Value Ref Range Status  07/23/2021 0.89 0.57 - 1.00 mg/dL Final         Passed - K in normal range and within 180 days    Potassium  Date Value Ref Range Status  07/23/2021 3.8 3.5 - 5.2 mmol/L Final         Passed - Na in normal range and within 180 days    Sodium  Date Value Ref Range Status  07/23/2021 142 134 - 144 mmol/L Final         Passed - Last BP in normal range    BP Readings from Last 1 Encounters:  07/23/21 100/60         Passed - Valid encounter within last 6 months    Recent Outpatient Visits          4 months ago Primary hypertension   [place], [place], DO   5 months ago Peripheral edema   [facility] [place], [place], DO   6 months ago Primary hypertension   [facility] [place], [place], DO   7 months ago Nasal congestion   [place], Megan P, DO   1 year ago Routine general medical examination at a health care facility   [place], Barb Merino, DO      Future Appointments            [place], [place], DO [place], [place]   In 2 weeks [facility], [place], [place]

## 2021-12-03 ENCOUNTER — Ambulatory Visit (INDEPENDENT_AMBULATORY_CARE_PROVIDER_SITE_OTHER): Payer: Medicare Other | Admitting: Family Medicine

## 2021-12-03 ENCOUNTER — Encounter: Payer: Self-pay | Admitting: Family Medicine

## 2021-12-03 VITALS — BP 109/70 | HR 69 | Temp 97.9°F | Ht 60.0 in | Wt 166.5 lb

## 2021-12-03 DIAGNOSIS — I10 Essential (primary) hypertension: Secondary | ICD-10-CM

## 2021-12-03 DIAGNOSIS — R052 Subacute cough: Secondary | ICD-10-CM | POA: Diagnosis not present

## 2021-12-03 DIAGNOSIS — Z23 Encounter for immunization: Secondary | ICD-10-CM | POA: Diagnosis not present

## 2021-12-03 DIAGNOSIS — Z1211 Encounter for screening for malignant neoplasm of colon: Secondary | ICD-10-CM | POA: Diagnosis not present

## 2021-12-03 DIAGNOSIS — Z0000 Encounter for general adult medical examination without abnormal findings: Secondary | ICD-10-CM

## 2021-12-03 DIAGNOSIS — E782 Mixed hyperlipidemia: Secondary | ICD-10-CM | POA: Diagnosis not present

## 2021-12-03 DIAGNOSIS — Z1231 Encounter for screening mammogram for malignant neoplasm of breast: Secondary | ICD-10-CM | POA: Diagnosis not present

## 2021-12-03 LAB — URINALYSIS, ROUTINE W REFLEX MICROSCOPIC
Bilirubin, UA: NEGATIVE
Glucose, UA: NEGATIVE
Ketones, UA: NEGATIVE
Nitrite, UA: NEGATIVE
Protein,UA: NEGATIVE
Specific Gravity, UA: >1.03 — ABNORMAL HIGH (ref 1.005–1.030)
Urobilinogen, Ur: 0.2 mg/dL (ref 0.2–1.0)
pH, UA: 5.5 (ref 5.0–7.5)

## 2021-12-03 LAB — MICROSCOPIC EXAMINATION: Bacteria, UA: NONE SEEN

## 2021-12-03 LAB — MICROALBUMIN, URINE WAIVED
Creatinine, Urine Waived: 300 mg/dL (ref 10–300)
Microalb, Ur Waived: 80 mg/L — ABNORMAL HIGH (ref 0–19)
Microalb/Creat Ratio: <30 mg/g (ref <30)

## 2021-12-03 MED ORDER — LOSARTAN POTASSIUM 25 MG PO TABS: 12.5000 mg | ORAL_TABLET | Freq: Every day | ORAL | 2 refills | Status: DC

## 2021-12-03 NOTE — Assessment & Plan Note (Signed)
Under good control. Concern for cough due to lisinopril. Will change to losartan and recheck 1 month.

## 2021-12-03 NOTE — Assessment & Plan Note (Signed)
Rechecking labs today. Await results. Treat as needed.  °

## 2021-12-03 NOTE — Progress Notes (Signed)
BP 109/70   Pulse 69   Temp 97.9 F (36.6 C)   Ht 5' (1.524 m)   Wt 166 lb 8 oz (75.5 kg)   SpO2 98%   BMI 32.52 kg/m    Subjective:    Patient ID: Vanessa Casey, female    DOB: 03-23-1950, 71 y.o.   MRN: [id number]  HPI: Vanessa Casey is a 71 y.o. female presenting on 12/03/2021 for comprehensive medical examination. Current medical complaints include:  HYPERTENSION  Hypertension status: controlled  Satisfied with current treatment? yes Duration of hypertension: chronic BP medication side effects:  unsure Medication compliance: excellent compliance Previous BP meds:lisinopril-HCTZ Aspirin: no Recurrent headaches: no Visual changes: no Palpitations: no Dyspnea: no Chest pain: no Lower extremity edema: no Dizzy/lightheaded: no  Menopausal Symptoms: no  Depression Screen done today and results listed below:     12/03/2021   10:15 AM 05/28/2021    4:00 PM 04/29/2021   11:36 AM 07/17/2020    2:02 PM 04/27/2020    1:45 PM  Depression screen PHQ 2/9  Decreased Interest 0 0 0 0 0  Down, Depressed, Hopeless 0 0 0 0 0  PHQ - 2 Score 0 0 0 0 0  Altered sleeping 0 0     Tired, decreased energy 0 0     Change in appetite 0 0     Feeling bad or failure about yourself  0 0     Trouble concentrating 0 0     Moving slowly or fidgety/restless 0 0     Suicidal thoughts 0 0     PHQ-9 Score 0 0     Difficult doing work/chores Not difficult at all       Past Medical History:  Past Medical History:  Diagnosis Date   Acute cholecystitis 03/04/2018   Atypical mole 07/30/2017   left breast/excision   Basal cell carcinoma 07/30/2017   right jaw   Basal cell carcinoma 09/16/2017   left upper arm/excision   Basal cell carcinoma 11/22/2018   right upper cutaneous lip   BCC (basal cell carcinoma of skin) 11/13/2021   upper mid forehead (Nodular)  needs referral for MOHS   BCC (basal cell carcinoma of skin) 11/13/2021   left upper forehead at hairline (superficial)  needs  treatment schedule MOHS or ED&C   Edema    FEET/ LEGS   Medical history non-contributory    Post-operative nausea and vomiting 04/30/2017   Shingles     Surgical History:  Past Surgical History:  Procedure Laterality Date   BASAL CELL CARCINOMA EXCISION     CATARACT EXTRACTION W/PHACO Right 05/05/2017   Procedure: CATARACT EXTRACTION PHACO AND INTRAOCULAR LENS PLACEMENT ([place]);  Surgeon: Birder Robson, MD;  Location: [facility] ORS;  Service: Ophthalmology;  Laterality: Right;  [place] 00:33.2 AP% 15.8 CDE 5.25 Fluid Pack Lot # [id number]   CATARACT EXTRACTION W/PHACO Left 06/09/2017   Procedure: CATARACT EXTRACTION PHACO AND INTRAOCULAR LENS PLACEMENT ([facility]);  Surgeon: Birder Robson, MD;  Location: [facility] OR[facility];  Service: Ophthalmology;  Laterality: Left;  [place] 01:02.2 AP% 15.2 CDE 9.48 Fluid Pack Lot # [id number]   CHOLECYSTECTOMY N/A 03/04/2018   Procedure: LAPAROSCOPIC CHOLECYSTECTOMY;  Surgeon: Jules Husbands, MD;  Location: [facility] OR[facility];  Service: General;  Laterality: N/A;   TOTAL ABDOMINAL HYSTERECTOMY      Medications:  Current Outpatient Medications on File Prior to Visit  Medication Sig   hydrochlorothiazide (HYDRODIURIL) 25 MG tablet TAKE 1 TABLET(25 MG) BY MOUTH DAILY   No current  facility-administered medications on file prior to visit.    Allergies:  No Known Allergies  Social History:  Social History   Socioeconomic History   Marital status: Single    Spouse name: Not on file   Number of children: Not on file   Years of education: Not on file   Highest education level: Not on file  Occupational History   Not on file  Tobacco Use   Smoking status: Never   Smokeless tobacco: Never  Vaping Use   Vaping Use: Never used  Substance and Sexual Activity   Alcohol use: No   Drug use: No   Sexual activity: Not Currently    Birth control/protection: Post-menopausal  Other Topics Concern   Not on file  Social History Narrative   Not on file   Social Determinants of  Health   Financial Resource Strain: Low Risk  (04/29/2021)   Overall Financial Resource Strain (CARDIA)    Difficulty of Paying Living Expenses: Not hard at all  Food Insecurity: No Food Insecurity (04/29/2021)   Hunger Vital Sign    Worried About Running Out of Food in the Last Year: Never true    [place] in the Last Year: Never true  Transportation Needs: No Transportation Needs (04/29/2021)   PRAPARE - Transportation    Lack of Transportation (Medical): No    Lack of Transportation (Non-Medical): No  Physical Activity: Insufficiently Active (04/29/2021)   Exercise Vital Sign    Days of Exercise per Week: 3 days    Minutes of Exercise per Session: 30 min  Stress: No Stress Concern Present (04/29/2021)   [place]    Feeling of Stress : Not at all  Social Connections: Unknown (04/29/2021)   Social Connection and Isolation Panel [NHANES]    Frequency of Communication with Friends and Family: More than three times a week    Frequency of Social Gatherings with Friends and Family: More than three times a week    Attends Religious Services: Never    Marine scientist or Organizations: No    Attends Archivist Meetings: Never    Marital Status: Not on file  Intimate Partner Violence: Not At Risk (04/29/2021)   Humiliation, Afraid, Rape, and Kick questionnaire    Fear of Current or Ex-Partner: No    Emotionally Abused: No    Physically Abused: No    Sexually Abused: No   Social History   Tobacco Use  Smoking Status Never  Smokeless Tobacco Never   Social History   Substance and Sexual Activity  Alcohol Use No    Family History:  Family History  Problem Relation Age of Onset   Dementia Mother    Heart failure Father    Heart attack Sister    Heart attack Brother    Breast cancer Maternal Aunt     Past medical history, surgical history, medications, allergies, family history and social  history reviewed with patient today and changes made to appropriate areas of the chart.   Review of Systems  Constitutional: Negative.   HENT: Negative.    Eyes:  Positive for blurred vision. Negative for double vision, photophobia, pain, discharge and redness.  Respiratory:  Positive for cough. Negative for hemoptysis, sputum production, shortness of breath and wheezing.   Cardiovascular:  Positive for palpitations. Negative for chest pain, orthopnea, claudication, leg swelling and PND.  Gastrointestinal: Negative.   Genitourinary: Negative.   Musculoskeletal: Negative.  Skin: Negative.   Neurological: Negative.   Endo/Heme/Allergies: Negative.   Psychiatric/Behavioral: Negative.     All other ROS negative except what is listed above and in the HPI.      Objective:    BP 109/70   Pulse 69   Temp 97.9 F (36.6 C)   Ht 5' (1.524 m)   Wt 166 lb 8 oz (75.5 kg)   SpO2 98%   BMI 32.52 kg/m   Wt Readings from Last 3 Encounters:  12/03/21 166 lb 8 oz (75.5 kg)  07/23/21 159 lb 6.4 oz (72.3 kg)  06/17/21 162 lb 6.4 oz (73.7 kg)    Physical Exam Vitals and nursing note reviewed.  Constitutional:      General: She is not in acute distress.    Appearance: Normal appearance. She is not ill-appearing, toxic-appearing or diaphoretic.  HENT:     Head: Normocephalic and atraumatic.     Right Ear: Tympanic membrane, ear canal and external ear normal. There is no impacted cerumen.     Left Ear: Tympanic membrane, ear canal and external ear normal. There is no impacted cerumen.     Nose: Nose normal. No congestion or rhinorrhea.     Mouth/Throat:     Mouth: Mucous membranes are moist.     Pharynx: Oropharynx is clear. No oropharyngeal exudate or posterior oropharyngeal erythema.  Eyes:     General: No scleral icterus.       Right eye: No discharge.        Left eye: No discharge.     Extraocular Movements: Extraocular movements intact.     Conjunctiva/sclera: Conjunctivae normal.      Pupils: Pupils are equal, round, and reactive to light.  Neck:     Vascular: No carotid bruit.  Cardiovascular:     Rate and Rhythm: Normal rate and regular rhythm.     Pulses: Normal pulses.     Heart sounds: No murmur heard.    No friction rub. No gallop.  Pulmonary:     Effort: Pulmonary effort is normal. No respiratory distress.     Breath sounds: Normal breath sounds. No stridor. No wheezing, rhonchi or rales.  Chest:     Chest wall: No tenderness.  Abdominal:     General: Abdomen is flat. Bowel sounds are normal. There is no distension.     Palpations: Abdomen is soft. There is no mass.     Tenderness: There is no abdominal tenderness. There is no right CVA tenderness, left CVA tenderness, guarding or rebound.     Hernia: No hernia is present.  Genitourinary:    Comments: Breast and pelvic exams deferred with shared decision making Musculoskeletal:        General: No swelling, tenderness, deformity or signs of injury.     Cervical back: Normal range of motion and neck supple. No rigidity. No muscular tenderness.     Right lower leg: No edema.     Left lower leg: No edema.  Lymphadenopathy:     Cervical: No cervical adenopathy.  Skin:    General: Skin is warm and dry.     Capillary Refill: Capillary refill takes less than 2 seconds.     Coloration: Skin is not jaundiced or pale.     Findings: No bruising, erythema, lesion or rash.  Neurological:     General: No focal deficit present.     Mental Status: She is alert and oriented to person, place, and time. Mental status is at baseline.  Cranial Nerves: No cranial nerve deficit.     Sensory: No sensory deficit.     Motor: No weakness.     Coordination: Coordination normal.     Gait: Gait normal.     Deep Tendon Reflexes: Reflexes normal.  Psychiatric:        Mood and Affect: Mood normal.        Behavior: Behavior normal.        Thought Content: Thought content normal.        Judgment: Judgment normal.      Results for orders placed or performed in visit on 01/74/94  Basic metabolic panel  Result Value Ref Range   Glucose 100 (H) 70 - 99 mg/dL   BUN 21 8 - 27 mg/dL   Creatinine, Ser 0.89 0.57 - 1.00 mg/dL   eGFR 70 >59 mL/min/1.73   BUN/Creatinine Ratio 24 12 - 28   Sodium 142 134 - 144 mmol/L   Potassium 3.8 3.5 - 5.2 mmol/L   Chloride 101 96 - 106 mmol/L   CO2 24 20 - 29 mmol/L   Calcium 9.4 8.7 - 10.3 mg/dL      Assessment & Plan:   Problem List Items Addressed This Visit       Cardiovascular and Mediastinum   HTN (hypertension)    Under good control. Concern for cough due to lisinopril. Will change to losartan and recheck 1 month.       Relevant Medications   losartan (COZAAR) 25 MG tablet   Other Relevant Orders   CBC with Differential/Platelet   Comprehensive metabolic panel   Urinalysis, Routine w reflex microscopic   TSH   Microalbumin, Urine Waived     Other   Mixed hyperlipidemia    Rechecking labs today. Await results. Treat as needed.       Relevant Medications   losartan (COZAAR) 25 MG tablet   Other Relevant Orders   CBC with Differential/Platelet   Comprehensive metabolic panel   Lipid Panel w/o Chol/HDL Ratio   Other Visit Diagnoses     Routine general medical examination at a health care facility    -  Primary   Vaccines up to date. Screening labs checked today. Mammo and Cologuard ordered. Continue diet and exercise. Call with any concerns.    Subacute cough       Lungs clear and nose/throat looks good. Concern for cough due to lisinopril. Will change to losartan and recheck 1 month.    Screening for colon cancer       Cologuard ordered today.   Relevant Orders   Cologuard   Encounter for screening mammogram for malignant neoplasm of breast       Mammogram scheduled.   Relevant Orders   MM 3D SCREEN BREAST BILATERAL   Need for influenza vaccination       Flu shot given today.   Relevant Orders   Flu Vaccine QUAD High Dose(Fluad)  (Completed)        Follow up plan: Return in about 4 weeks (around 12/31/2021).   LABORATORY TESTING:  - Pap smear: not applicable  IMMUNIZATIONS:   - Tdap: Tetanus vaccination status reviewed: last tetanus booster within 10 years. - Influenza: Administered today - Pneumovax: Up to date - Prevnar: Up to date - COVID: Up to date - HPV: Not applicable - Shingrix vaccine: Not applicable  SCREENING: -Mammogram:  Scheduled   - Colonoscopy: Ordered today  - Bone Density: Up to date   PATIENT COUNSELING:   Advised  to take 1 mg of folate supplement per day if capable of pregnancy.   Sexuality: Discussed sexually transmitted diseases, partner selection, use of condoms, avoidance of unintended pregnancy  and contraceptive alternatives.   Advised to avoid cigarette smoking.  I discussed with the patient that most people either abstain from alcohol or drink within safe limits (<=14/week and <=4 drinks/occasion for males, <=7/weeks and <= 3 drinks/occasion for females) and that the risk for alcohol disorders and other health effects rises proportionally with the number of drinks per week and how often a drinker exceeds daily limits.  Discussed cessation/primary prevention of drug use and availability of treatment for abuse.   Diet: Encouraged to adjust caloric intake to maintain  or achieve ideal body weight, to reduce intake of dietary saturated fat and total fat, to limit sodium intake by avoiding high sodium foods and not adding table salt, and to maintain adequate dietary potassium and calcium preferably from fresh fruits, vegetables, and low-fat dairy products.    stressed the importance of regular exercise  Injury prevention: Discussed safety belts, safety helmets, smoke detector, smoking near bedding or upholstery.   Dental health: Discussed importance of regular tooth brushing, flossing, and dental visits.    NEXT PREVENTATIVE PHYSICAL DUE IN 1 YEAR. Return in about 4 weeks  (around 12/31/2021).

## 2021-12-04 LAB — LIPID PANEL W/O CHOL/HDL RATIO
Cholesterol, Total: 197 mg/dL (ref 100–199)
HDL: 41 mg/dL (ref >39)
LDL Chol Calc (NIH): 95 mg/dL (ref 0–99)
Triglycerides: 363 mg/dL — ABNORMAL HIGH (ref 0–149)
VLDL Cholesterol Cal: 61 mg/dL — ABNORMAL HIGH (ref 5–40)

## 2021-12-04 LAB — CBC WITH DIFFERENTIAL/PLATELET
Basophils Absolute: 0 10*3/uL (ref 0.0–0.2)
Basos: 1 %
EOS (ABSOLUTE): 0.1 10*3/uL (ref 0.0–0.4)
Eos: 1 %
Hematocrit: 34.7 % (ref 34.0–46.6)
Hemoglobin: 11.5 g/dL (ref 11.1–15.9)
Immature Grans (Abs): 0 10*3/uL (ref 0.0–0.1)
Immature Granulocytes: 0 %
Lymphocytes Absolute: 2.2 10*3/uL (ref 0.7–3.1)
Lymphs: 31 %
MCH: 28 pg (ref 26.6–33.0)
MCHC: 33.1 g/dL (ref 31.5–35.7)
MCV: 85 fL (ref 79–97)
Monocytes Absolute: 0.6 10*3/uL (ref 0.1–0.9)
Monocytes: 8 %
Neutrophils Absolute: 4.3 10*3/uL (ref 1.4–7.0)
Neutrophils: 59 %
Platelets: 269 10*3/uL (ref 150–450)
RBC: 4.1 x10E6/uL (ref 3.77–5.28)
RDW: 12.7 % (ref 11.7–15.4)
WBC: 7.3 10*3/uL (ref 3.4–10.8)

## 2021-12-04 LAB — COMPREHENSIVE METABOLIC PANEL
ALT: 11 IU/L (ref 0–32)
AST: 17 IU/L (ref 0–40)
Albumin/Globulin Ratio: 1.5 (ref 1.2–2.2)
Albumin: 4.2 g/dL (ref 3.9–4.9)
Alkaline Phosphatase: 91 IU/L (ref 44–121)
BUN/Creatinine Ratio: 18 (ref 12–28)
BUN: 16 mg/dL (ref 8–27)
Bilirubin Total: <0.2 mg/dL (ref 0.0–1.2)
CO2: 23 mmol/L (ref 20–29)
Calcium: 9.2 mg/dL (ref 8.7–10.3)
Chloride: 99 mmol/L (ref 96–106)
Creatinine, Ser: 0.88 mg/dL (ref 0.57–1.00)
Globulin, Total: 2.8 g/dL (ref 1.5–4.5)
Glucose: 88 mg/dL (ref 70–99)
Potassium: 4.3 mmol/L (ref 3.5–5.2)
Sodium: 137 mmol/L (ref 134–144)
Total Protein: 7 g/dL (ref 6.0–8.5)
eGFR: 71 mL/min/{1.73_m2} (ref >59)

## 2021-12-04 LAB — TSH: TSH: 1.93 u[IU]/mL (ref 0.450–4.500)

## 2021-12-17 ENCOUNTER — Ambulatory Visit: Payer: Medicare Other | Admitting: Dermatology

## 2021-12-25 ENCOUNTER — Ambulatory Visit
Admission: RE | Admit: 2021-12-25 | Discharge: 2021-12-25 | Disposition: A | Discharge status: Home / Self Care | Payer: Medicare Other | Source: Ambulatory Visit | Attending: Family Medicine | Admitting: Family Medicine

## 2021-12-25 DIAGNOSIS — Z1231 Encounter for screening mammogram for malignant neoplasm of breast: Secondary | ICD-10-CM | POA: Insufficient documentation

## 2021-12-31 ENCOUNTER — Encounter: Payer: Self-pay | Admitting: Family Medicine

## 2021-12-31 ENCOUNTER — Ambulatory Visit (INDEPENDENT_AMBULATORY_CARE_PROVIDER_SITE_OTHER): Payer: Medicare Other | Admitting: Family Medicine

## 2021-12-31 VITALS — BP 127/68 | HR 58 | Temp 98.3°F | Wt 166.9 lb

## 2021-12-31 DIAGNOSIS — I10 Essential (primary) hypertension: Secondary | ICD-10-CM

## 2021-12-31 MED ORDER — HYDROCHLOROTHIAZIDE 25 MG PO TABS: ORAL_TABLET | ORAL | 1 refills | Status: DC

## 2021-12-31 MED ORDER — IBUPROFEN 600 MG PO TABS: 600.0000 mg | ORAL_TABLET | Freq: Three times a day (TID) | ORAL | 0 refills | Status: DC | PRN

## 2021-12-31 MED ORDER — LOSARTAN POTASSIUM 25 MG PO TABS: 12.5000 mg | ORAL_TABLET | Freq: Every day | ORAL | 1 refills | Status: DC

## 2021-12-31 NOTE — Assessment & Plan Note (Signed)
Under good control on current regimen. Continue current regimen. Continue to monitor. Call with any concerns. Refills given. Labs drawn today.   

## 2021-12-31 NOTE — Progress Notes (Signed)
BP 127/68   Pulse (!) 58   Temp 98.3 F (36.8 C)   Wt 166 lb 14.4 oz (75.7 kg)   SpO2 98%   BMI 32.60 kg/m    Subjective:    Patient ID: Vanessa Casey, female    DOB: Feb 26, 1951, 71 y.o.   MRN: [id number]  HPI: Vanessa Casey is a 71 y.o. female  Chief Complaint  Patient presents with   Hypertension    Patient here to follow up on change from lisinopril to losartan. Patient states her cough is gone but her ankles have been swelling daily.    HYPERTENSION Hypertension status: better  Satisfied with current treatment? yes Duration of hypertension: chronic BP monitoring frequency:  not checking BP medication side effects:  no Medication compliance: excellent compliance Previous BP meds:lisinopril, losartan Aspirin: no Recurrent headaches: no Visual changes: no Palpitations: no Dyspnea: no Chest pain: no Lower extremity edema: no Dizzy/lightheaded: no  Relevant past medical, surgical, family and social history reviewed and updated as indicated. Interim medical history since our last visit reviewed. Allergies and medications reviewed and updated.  Review of Systems  Constitutional: Negative.   Respiratory: Negative.    Cardiovascular: Negative.   Gastrointestinal: Negative.   Musculoskeletal: Negative.   Neurological: Negative.   Psychiatric/Behavioral: Negative.      Per HPI unless specifically indicated above     Objective:    BP 127/68   Pulse (!) 58   Temp 98.3 F (36.8 C)   Wt 166 lb 14.4 oz (75.7 kg)   SpO2 98%   BMI 32.60 kg/m   Wt Readings from Last 3 Encounters:  12/31/21 166 lb 14.4 oz (75.7 kg)  12/03/21 166 lb 8 oz (75.5 kg)  07/23/21 159 lb 6.4 oz (72.3 kg)    Physical Exam Vitals and nursing note reviewed.  Constitutional:      General: She is not in acute distress.    Appearance: Normal appearance. She is normal weight. She is not ill-appearing, toxic-appearing or diaphoretic.  HENT:     Head: Normocephalic and atraumatic.      Right Ear: External ear normal.     Left Ear: External ear normal.     Nose: Nose normal.     Mouth/Throat:     Mouth: Mucous membranes are moist.     Pharynx: Oropharynx is clear.  Eyes:     General: No scleral icterus.       Right eye: No discharge.        Left eye: No discharge.     Extraocular Movements: Extraocular movements intact.     Conjunctiva/sclera: Conjunctivae normal.     Pupils: Pupils are equal, round, and reactive to light.  Cardiovascular:     Rate and Rhythm: Normal rate and regular rhythm.     Pulses: Normal pulses.     Heart sounds: Normal heart sounds. No murmur heard.    No friction rub. No gallop.  Pulmonary:     Effort: Pulmonary effort is normal. No respiratory distress.     Breath sounds: Normal breath sounds. No stridor. No wheezing, rhonchi or rales.  Chest:     Chest wall: No tenderness.  Musculoskeletal:        General: Normal range of motion.     Cervical back: Normal range of motion and neck supple.  Skin:    General: Skin is warm and dry.     Capillary Refill: Capillary refill takes less than 2 seconds.     Coloration: Skin  is not jaundiced or pale.     Findings: No bruising, erythema, lesion or rash.  Neurological:     General: No focal deficit present.     Mental Status: She is alert and oriented to person, place, and time. Mental status is at baseline.  Psychiatric:        Mood and Affect: Mood normal.        Behavior: Behavior normal.        Thought Content: Thought content normal.        Judgment: Judgment normal.     Results for orders placed or performed in visit on 12/03/21  Microscopic Examination   Urine  Result Value Ref Range   WBC, UA 0-5 0 - 5 /hpf   RBC, Urine 0-2 0 - 2 /hpf   Epithelial Cells (non renal) 0-10 0 - 10 /hpf   Bacteria, UA None seen None seen/Few  CBC with Differential/Platelet  Result Value Ref Range   WBC 7.3 3.4 - 10.8 x10E3/uL   RBC 4.10 3.77 - 5.28 x10E6/uL   Hemoglobin 11.5 11.1 - 15.9 g/dL    Hematocrit 34.7 34.0 - 46.6 %   MCV 85 79 - 97 fL   MCH 28.0 26.6 - 33.0 pg   MCHC 33.1 31.5 - 35.7 g/dL   RDW 12.7 11.7 - 15.4 %   Platelets 269 150 - 450 x10E3/uL   Neutrophils 59 Not Estab. %   Lymphs 31 Not Estab. %   Monocytes 8 Not Estab. %   Eos 1 Not Estab. %   Basos 1 Not Estab. %   Neutrophils Absolute 4.3 1.4 - 7.0 x10E3/uL   Lymphocytes Absolute 2.2 0.7 - 3.1 x10E3/uL   Monocytes Absolute 0.6 0.1 - 0.9 x10E3/uL   EOS (ABSOLUTE) 0.1 0.0 - 0.4 x10E3/uL   Basophils Absolute 0.0 0.0 - 0.2 x10E3/uL   Immature Granulocytes 0 Not Estab. %   Immature Grans (Abs) 0.0 0.0 - 0.1 x10E3/uL  Comprehensive metabolic panel  Result Value Ref Range   Glucose 88 70 - 99 mg/dL   BUN 16 8 - 27 mg/dL   Creatinine, Ser 0.88 0.57 - 1.00 mg/dL   eGFR 71 >59 mL/min/1.73   BUN/Creatinine Ratio 18 12 - 28   Sodium 137 134 - 144 mmol/L   Potassium 4.3 3.5 - 5.2 mmol/L   Chloride 99 96 - 106 mmol/L   CO2 23 20 - 29 mmol/L   Calcium 9.2 8.7 - 10.3 mg/dL   Total Protein 7.0 6.0 - 8.5 g/dL   Albumin 4.2 3.9 - 4.9 g/dL   Globulin, Total 2.8 1.5 - 4.5 g/dL   Albumin/Globulin Ratio 1.5 1.2 - 2.2   Bilirubin Total <0.2 0.0 - 1.2 mg/dL   Alkaline Phosphatase 91 44 - 121 IU/L   AST 17 0 - 40 IU/L   ALT 11 0 - 32 IU/L  Lipid Panel w/o Chol/HDL Ratio  Result Value Ref Range   Cholesterol, Total 197 100 - 199 mg/dL   Triglycerides 363 (H) 0 - 149 mg/dL   HDL 41 >39 mg/dL   VLDL Cholesterol Cal 61 (H) 5 - 40 mg/dL   LDL Chol Calc (NIH) 95 0 - 99 mg/dL  Urinalysis, Routine w reflex microscopic  Result Value Ref Range   Specific Gravity, UA >1.030 (H) 1.005 - 1.030   pH, UA 5.5 5.0 - 7.5   Color, UA Yellow Yellow   Appearance Ur Clear Clear   Leukocytes,UA 1+ (A) Negative   Protein,UA Negative  Negative/Trace   Glucose, UA Negative Negative   Ketones, UA Negative Negative   RBC, UA 2+ (A) Negative   Bilirubin, UA Negative Negative   Urobilinogen, Ur 0.2 0.2 - 1.0 mg/dL   Nitrite, UA Negative  Negative   Microscopic Examination See below:   TSH  Result Value Ref Range   TSH 1.930 0.450 - 4.500 uIU/mL  Microalbumin, Urine Waived  Result Value Ref Range   Microalb, Ur Waived 80 (H) 0 - 19 mg/L   Creatinine, Urine Waived 300 10 - 300 mg/dL   Microalb/Creat Ratio <30 <30 mg/g      Assessment & Plan:   Problem List Items Addressed This Visit       Cardiovascular and Mediastinum   HTN (hypertension) - Primary    Under good control on current regimen. Continue current regimen. Continue to monitor. Call with any concerns. Refills given. Labs drawn today.       Relevant Medications   losartan (COZAAR) 25 MG tablet   hydrochlorothiazide (HYDRODIURIL) 25 MG tablet   Other Relevant Orders   Basic metabolic panel     Follow up plan: Return in about 5 months (around 06/01/2022).

## 2022-01-01 LAB — BASIC METABOLIC PANEL
BUN/Creatinine Ratio: 18 (ref 12–28)
BUN: 15 mg/dL (ref 8–27)
CO2: 23 mmol/L (ref 20–29)
Calcium: 9.1 mg/dL (ref 8.7–10.3)
Chloride: 103 mmol/L (ref 96–106)
Creatinine, Ser: 0.84 mg/dL (ref 0.57–1.00)
Glucose: 87 mg/dL (ref 70–99)
Potassium: 3.8 mmol/L (ref 3.5–5.2)
Sodium: 142 mmol/L (ref 134–144)
eGFR: 75 mL/min/{1.73_m2} (ref >59)

## 2022-01-09 ENCOUNTER — Ambulatory Visit: Payer: Self-pay | Admitting: *Deleted

## 2022-01-09 NOTE — Telephone Encounter (Signed)
  Chief Complaint: leg swelling- medication SE? Symptoms: chronic leg swelling- patient states provider is aware- but getting worse and not better Frequency: 1-2 months Pertinent Negatives: Patient denies chest pain, SOB Disposition: '[]'$ ED /'[]'$ Urgent Care (no appt availability in office) / '[]'$ Appointment(In office/virtual)/ '[]'$  [facility]/ '[]'$ Home Care/ '[]'$ Refused Recommended Disposition /'[]'$ [facility] [facility]/ '[x]'$  Follow-up with PCP Additional Notes: Patient states she has had recent change in BP medication and feels it may be effecting edema in legs- worse. Patient is calling to make provider aware and to see if PCP wants to change BP medication. Patient advised I would send message for review- she may need to have appointment.

## 2022-01-09 NOTE — Telephone Encounter (Signed)
Reason for Disposition  [1] MILD swelling of both ankles (i.e., pedal edema) AND [2] is a chronic symptom (recurrent or ongoing AND present > 4 weeks)  Answer Assessment - Initial Assessment Questions 1. ONSET: "When did the swelling start?" (e.g., minutes, hours, days)     1-2 months 2. LOCATION: "What part of the leg is swollen?"  "Are both legs swollen or just one leg?"     Both legs and ankles 3. SEVERITY: "How bad is the swelling?" (e.g., localized; mild, moderate, severe)   - Localized: Small area of swelling localized to one leg.   - MILD pedal edema: Swelling limited to foot and ankle, pitting edema < 1/4 inch (6 mm) deep, rest and elevation eliminate most or all swelling.   - MODERATE edema: Swelling of lower leg to knee, pitting edema > 1/4 inch (6 mm) deep, rest and elevation only partially reduce swelling.   - SEVERE edema: Swelling extends above knee, facial or hand swelling present.      moderate 4. REDNESS: "Does the swelling look red or infected?"     no 5. PAIN: "Is the swelling painful to touch?" If Yes, ask: "How painful is it?"   (Scale 1-10; mild, moderate or severe)     Yes- pressure 6. FEVER: "Do you have a fever?" If Yes, ask: "What is it, how was it measured, and when did it start?"      no 7. CAUSE: "What do you think is causing the leg swelling?"     Medication change- losartan-1 month ago- discussed at last visit- presently taking 1/2 losartan '25mg'$ , HCTZ 8. MEDICAL HISTORY: "Do you have a history of blood clots (e.g., DVT), cancer, heart failure, kidney disease, or liver failure?"      Heart disease 9. RECURRENT SYMPTOM: "Have you had leg swelling before?" If Yes, ask: "When was the last time?" "What happened that time?"     A little- not this bad 10. OTHER SYMPTOMS: "Do you have any other symptoms?" (e.g., chest pain, difficulty breathing)       no  Protocols used: Leg Swelling and Edema-A-AH

## 2022-01-13 MED ORDER — VALSARTAN 40 MG PO TABS: 20.0000 mg | ORAL_TABLET | Freq: Every day | ORAL | 1 refills | Status: DC

## 2022-01-13 NOTE — Telephone Encounter (Signed)
New med sent to her pharmacy. Please make sure she has a follow up in about a month to make sure it's working the same. Thanks!

## 2022-01-13 NOTE — Telephone Encounter (Signed)
Attempted to contact patient NA LVM advising patient new rx was sent in. Please schedule patient for follow up in one month if patient returns call.

## 2022-01-13 NOTE — Addendum Note (Signed)
Addended by: Valerie Roys on: 01/13/2022 04:52 PM   Modules accepted: Orders

## 2022-01-14 NOTE — Telephone Encounter (Signed)
Patient aware of medication at pharmacy, states she will pick up today. Scheduled patient for 1 month follow up. No further questions.

## 2022-02-03 ENCOUNTER — Ambulatory Visit (INDEPENDENT_AMBULATORY_CARE_PROVIDER_SITE_OTHER): Payer: Medicare Other | Admitting: Family Medicine

## 2022-02-03 ENCOUNTER — Encounter: Payer: Self-pay | Admitting: Family Medicine

## 2022-02-03 VITALS — BP 112/67 | HR 76 | Temp 100.8°F | Ht 60.0 in | Wt 167.8 lb

## 2022-02-03 DIAGNOSIS — Z20822 Contact with and (suspected) exposure to covid-19: Secondary | ICD-10-CM | POA: Diagnosis not present

## 2022-02-03 LAB — VERITOR FLU A/B WAIVED
Influenza A: NEGATIVE
Influenza B: NEGATIVE

## 2022-02-03 MED ORDER — ONDANSETRON HCL 4 MG PO TABS: 4.0000 mg | ORAL_TABLET | Freq: Three times a day (TID) | ORAL | 0 refills | Status: DC | PRN

## 2022-02-03 MED ORDER — BENZONATATE 200 MG PO CAPS: 200.0000 mg | ORAL_CAPSULE | Freq: Two times a day (BID) | ORAL | 0 refills | Status: DC | PRN

## 2022-02-03 NOTE — Addendum Note (Signed)
Addended by: Valerie Roys on: 02/03/2022 09:17 AM   Modules accepted: Orders

## 2022-02-03 NOTE — Progress Notes (Signed)
BP 112/67   Pulse 76   Temp (!) 100.8 F (38.2 C) (Oral)   Ht 5' (1.524 m)   Wt 167 lb 12.8 oz (76.1 kg)   SpO2 98%   BMI 32.77 kg/m    Subjective:    Patient ID: Vanessa Casey, female    DOB: [birth year], 71 y.o.   MRN: [id number]  HPI: Vanessa Casey is a 71 y.o. female  Chief Complaint  Patient presents with   Covid Exposure   URI   UPPER RESPIRATORY TRACT INFECTION Duration: yesterday Worst symptom: vomiting Fever: yes Cough: yes Shortness of breath: no Wheezing: no Chest pain: no Chest tightness: no Chest congestion: no Nasal congestion: yes Runny nose: yes Post nasal drip: no Sneezing: no Sore throat: no Swollen glands: no Sinus pressure: no Headache: no Face pain: no Toothache: no Ear pain: no  Ear pressure: no  Eyes red/itching:no Eye drainage/crusting: no  Vomiting: yes Rash: no Fatigue: yes Sick contacts: yes Strep contacts: no  Context: better Recurrent sinusitis: no Relief with OTC cold/cough medications: no  Treatments attempted: none    Relevant past medical, surgical, family and social history reviewed and updated as indicated. Interim medical history since our last visit reviewed. Allergies and medications reviewed and updated.  Review of Systems  Constitutional:  Positive for chills, diaphoresis, fatigue and fever. Negative for activity change, appetite change and unexpected weight change.  HENT:  Positive for congestion and rhinorrhea. Negative for dental problem, drooling, ear discharge, ear pain, facial swelling, hearing loss, mouth sores, nosebleeds, postnasal drip, sinus pressure, sinus pain, sneezing, sore throat, tinnitus, trouble swallowing and voice change.   Eyes: Negative.   Respiratory:  Positive for cough. Negative for apnea, choking, chest tightness, shortness of breath, wheezing and stridor.   Cardiovascular: Negative.   Gastrointestinal: Negative.   Psychiatric/Behavioral: Negative.      Per HPI unless specifically  indicated above     Objective:    BP 112/67   Pulse 76   Temp (!) 100.8 F (38.2 C) (Oral)   Ht 5' (1.524 m)   Wt 167 lb 12.8 oz (76.1 kg)   SpO2 98%   BMI 32.77 kg/m   Wt Readings from Last 3 Encounters:  02/03/22 167 lb 12.8 oz (76.1 kg)  12/31/21 166 lb 14.4 oz (75.7 kg)  12/03/21 166 lb 8 oz (75.5 kg)    Physical Exam Vitals and nursing note reviewed.  Constitutional:      General: She is not in acute distress.    Appearance: Normal appearance. She is not ill-appearing, toxic-appearing or diaphoretic.  HENT:     Head: Normocephalic and atraumatic.     Right Ear: Hearing, tympanic membrane, ear canal and external ear normal.     Left Ear: Hearing, tympanic membrane, ear canal and external ear normal.     Nose: Mucosal edema, congestion and rhinorrhea present.     Mouth/Throat:     Lips: Pink.     Mouth: Mucous membranes are moist.     Pharynx: Oropharynx is clear. No pharyngeal swelling, oropharyngeal exudate, posterior oropharyngeal erythema or uvula swelling.     Tonsils: No tonsillar exudate or tonsillar abscesses.  Eyes:     General: No scleral icterus.       Right eye: No discharge.        Left eye: No discharge.     Extraocular Movements: Extraocular movements intact.     Conjunctiva/sclera: Conjunctivae normal.     Pupils: Pupils are equal, round, and  reactive to light.  Cardiovascular:     Rate and Rhythm: Normal rate and regular rhythm.     Pulses: Normal pulses.     Heart sounds: Normal heart sounds. No murmur heard.    No friction rub. No gallop.  Pulmonary:     Effort: Pulmonary effort is normal. No respiratory distress.     Breath sounds: Normal breath sounds. No stridor. No wheezing, rhonchi or rales.  Chest:     Chest wall: No tenderness.  Musculoskeletal:        General: Normal range of motion.     Cervical back: Normal range of motion and neck supple.  Skin:    General: Skin is warm and dry.     Capillary Refill: Capillary refill takes less  than 2 seconds.     Coloration: Skin is not jaundiced or pale.     Findings: No bruising, erythema, lesion or rash.  Neurological:     General: No focal deficit present.     Mental Status: She is alert and oriented to person, place, and time. Mental status is at baseline.  Psychiatric:        Mood and Affect: Mood normal.        Behavior: Behavior normal.        Thought Content: Thought content normal.        Judgment: Judgment normal.     Results for orders placed or performed in visit on 00/86/76  Basic metabolic panel  Result Value Ref Range   Glucose 87 70 - 99 mg/dL   BUN 15 8 - 27 mg/dL   Creatinine, Ser 0.84 0.57 - 1.00 mg/dL   eGFR 75 >59 mL/min/1.73   BUN/Creatinine Ratio 18 12 - 28   Sodium 142 134 - 144 mmol/L   Potassium 3.8 3.5 - 5.2 mmol/L   Chloride 103 96 - 106 mmol/L   CO2 23 20 - 29 mmol/L   Calcium 9.1 8.7 - 10.3 mg/dL      Assessment & Plan:   Problem List Items Addressed This Visit   None Visit Diagnoses     Close exposure to COVID-19 virus    -  Primary   Will check for COVID- will do at home test, if positive, will send meds faster. Tessalon and zofran for comfort. Call with any concerns or if not getting better        Follow up plan: Return if symptoms worsen or fail to improve.

## 2022-02-04 ENCOUNTER — Telehealth: Payer: Self-pay | Admitting: Family Medicine

## 2022-02-04 NOTE — Telephone Encounter (Signed)
Patient inquiring about COVID results

## 2022-02-04 NOTE — Telephone Encounter (Signed)
Spoke with patient and informed patient her COVID results have not yet returned. Patient advised once the results were received someone from our clinical staff would give her a call. Patient verbalized understanding and has no further questions.

## 2022-02-05 ENCOUNTER — Other Ambulatory Visit: Payer: Self-pay | Admitting: Nurse Practitioner

## 2022-02-05 LAB — NOVEL CORONAVIRUS, NAA: SARS-CoV-2, NAA: DETECTED — AB

## 2022-02-05 MED ORDER — MOLNUPIRAVIR EUA 200MG CAPSULE: 4.0000 | ORAL_CAPSULE | Freq: Two times a day (BID) | ORAL | 0 refills | Status: AC

## 2022-02-05 NOTE — Progress Notes (Signed)
Molnupiravir sent to the pharmacy.

## 2022-02-05 NOTE — Progress Notes (Signed)
Please let patient know that she is COVID positive.  If she would like to do the oral antiviral I am happy to send it in for her.

## 2022-02-12 ENCOUNTER — Other Ambulatory Visit: Payer: Self-pay | Admitting: Family Medicine

## 2022-02-12 NOTE — Telephone Encounter (Signed)
Dc 'd 12/03/21 (Change in therapy) Lowry Ram CMA  Requested Prescriptions  Refused Prescriptions Disp Refills   lisinopril (ZESTRIL) 10 MG tablet [Pharmacy Med Name: LISINOPRIL '10MG'$  TABLETS] 90 tablet 1    Sig: TAKE 1 TABLET(10 MG) BY MOUTH DAILY     Cardiovascular:  ACE Inhibitors Passed - 02/12/2022  3:13 AM      Passed - Cr in normal range and within 180 days    Creatinine, Ser  Date Value Ref Range Status  12/31/2021 0.84 0.57 - 1.00 mg/dL Final         Passed - K in normal range and within 180 days    Potassium  Date Value Ref Range Status  12/31/2021 3.8 3.5 - 5.2 mmol/L Final         Passed - Patient is not pregnant      Passed - Last BP in normal range    BP Readings from Last 1 Encounters:  02/03/22 112/67         Passed - Valid encounter within last 6 months    Recent Outpatient Visits           1 week ago Close exposure to COVID-19 virus   [place], Megan P, DO   1 month ago Primary hypertension   [place], Megan P, DO   2 months ago Routine general medical examination at a health care facility   [facility] [place] P, DO   6 months ago Primary hypertension   [place], [place], DO   8 months ago Peripheral edema   [place], Barb Merino, DO       Future Appointments             In 5 days Wynetta Emery, Barb Merino, DO [employer] [facility] [place]   In 3 months Wynetta Emery, Barb Merino, DO [facility]

## 2022-02-17 ENCOUNTER — Ambulatory Visit: Payer: Medicare Other | Admitting: Family Medicine

## 2022-02-24 ENCOUNTER — Encounter: Payer: Self-pay | Admitting: Dermatology

## 2022-02-24 ENCOUNTER — Ambulatory Visit: Payer: Medicare Other | Admitting: Dermatology

## 2022-02-24 VITALS — BP 144/67 | HR 73

## 2022-02-24 DIAGNOSIS — D492 Neoplasm of unspecified behavior of bone, soft tissue, and skin: Secondary | ICD-10-CM | POA: Diagnosis not present

## 2022-02-24 DIAGNOSIS — L820 Inflamed seborrheic keratosis: Secondary | ICD-10-CM

## 2022-02-24 DIAGNOSIS — H0014 Chalazion left upper eyelid: Secondary | ICD-10-CM

## 2022-02-24 NOTE — Patient Instructions (Addendum)
Cryotherapy Aftercare  Wash gently with soap and water everyday.   Apply Vaseline jelly daily until healed.   Recommend daily broad spectrum sunscreen SPF 30+ to sun-exposed areas, reapply every 2 hours as needed. Call for new or changing lesions.  Staying in the shade or wearing long sleeves, sun glasses (UVA+UVB protection) and wide brim hats (4-inch brim around the entire circumference of the hat) are also recommended for sun protection.    Due to recent changes in healthcare laws, you may see results of your pathology and/or laboratory studies on MyChart before the doctors have had a chance to review them. We understand that in some cases there may be results that are confusing or concerning to you. Please understand that not all results are received at the same time and often the doctors may need to interpret multiple results in order to provide you with the best plan of care or course of treatment. Therefore, we ask that you please give [place] 2 business days to thoroughly review all your results before contacting the office for clarification. Should we see a critical lab result, you will be contacted sooner.   If You Need Anything After Your Visit  If you have any questions or concerns for your doctor, please call our main line at [phone] and press option 4 to reach your doctor's medical assistant. If no one answers, please leave a voicemail as directed and we will return your call as soon as possible. Messages left after 4 pm will be answered the following business day.   You may also send [place] a message via [place]. We typically respond to MyChart messages within 1-2 business days.  For prescription refills, please ask your pharmacy to contact our office. Our fax number is [phone].  If you have an urgent issue when the clinic is closed that cannot wait until the next business day, you can page your doctor at the number below.    Please note that while we do our best to be available  for urgent issues outside of office hours, we are not available 24/7.   If you have an urgent issue and are unable to reach [place], you may choose to seek medical care at your doctor's office, retail clinic, urgent care center, or emergency room.  If you have a medical emergency, please immediately call 911 or go to the emergency department.  Pager Numbers  - Dr. Nehemiah Massed: [phone]  - Dr. Laurence Ferrari: [phone]  - Dr. Nicole Kindred: [phone]  In the event of inclement weather, please call our main line at [phone] for an update on the status of any delays or closures.  Dermatology Medication Tips: Please keep the boxes that topical medications come in in order to help keep track of the instructions about where and how to use these. Pharmacies typically print the medication instructions only on the boxes and not directly on the medication tubes.   If your medication is too expensive, please contact our office at [phone] option 4 or send [place] a message through [place].   We are unable to tell what your co-pay for medications will be in advance as this is different depending on your insurance coverage. However, we may be able to find a substitute medication at lower cost or fill out paperwork to get insurance to cover a needed medication.   If a prior authorization is required to get your medication covered by your insurance company, please allow [place] 1-2 business days to complete this process.  Drug prices  often vary depending on where the prescription is filled and some pharmacies may offer cheaper prices.  The website www.goodrx.com contains coupons for medications through different pharmacies. The prices here do not account for what the cost may be with help from insurance (it may be cheaper with your insurance), but the website can give you the price if you did not use any insurance.  - You can print the associated coupon and take it with your prescription to the pharmacy.  - You may  also stop by our office during regular business hours and pick up a GoodRx coupon card.  - If you need your prescription sent electronically to a different pharmacy, notify our office through [facility] or by phone at [phone] option 4.     Si Usted Necesita Algo Despus de Su Visita  Tambin puede enviarnos un mensaje a travs de Pharmacist, community. Por lo general respondemos a los mensajes de MyChart en el transcurso de 1 a 2 das hbiles.  Para renovar recetas, por favor pida a su farmacia que se ponga en contacto con nuestra oficina. Harland Dingwall de fax es [place] [phone].  Si tiene un asunto urgente cuando la clnica est cerrada y que no puede esperar hasta el siguiente da hbil, puede llamar/localizar a su doctor(a) al nmero que aparece a continuacin.   Por favor, tenga en cuenta que aunque hacemos todo lo posible para estar disponibles para asuntos urgentes fuera del horario de [place], no estamos disponibles las 24 horas del da, los 7 das de la [place].   Si tiene un problema urgente y no puede comunicarse con nosotros, puede optar por buscar atencin mdica  en el consultorio de su doctor(a), en una clnica privada, en un centro de atencin urgente o en una sala de emergencias.  Si tiene Engineering geologist, por favor llame inmediatamente al 911 o vaya a la sala de emergencias.  Nmeros de bper  - Dr. Nehemiah Massed: [phone]  - Dra. Moye: [phone]  - Dra. Nicole Kindred: [phone]  En caso de inclemencias del [place], por favor llame a Johnsie Kindred principal al [phone] para una actualizacin sobre el [place] de cualquier retraso o cierre.  Consejos para la medicacin en dermatologa: Por favor, guarde las cajas en las que vienen los medicamentos de uso tpico para ayudarle a seguir las instrucciones sobre dnde y cmo usarlos. Las farmacias generalmente imprimen las instrucciones del medicamento slo en las cajas y no directamente en los tubos del [place].    Si su medicamento es muy caro, por favor, pngase en contacto con Zigmund Daniel llamando al [phone] y presione la opcin 4 o envenos un mensaje a travs de Pharmacist, community.   No podemos decirle cul ser su copago por los medicamentos por adelantado ya que esto es diferente dependiendo de la cobertura de su seguro. Sin embargo, es posible que podamos encontrar un medicamento sustituto a Electrical engineer un formulario para que el seguro cubra el medicamento que se considera necesario.   Si se requiere una autorizacin previa para que su compaa de seguros [place] su medicamento, por favor permtanos de 1 a 2 das hbiles para completar este proceso.  Los precios de los medicamentos varan con frecuencia dependiendo del Environmental consultant de dnde se surte la receta y alguna farmacias pueden ofrecer precios ms baratos.  El sitio web www.goodrx.com tiene cupones para medicamentos de Airline pilot. Los precios aqu no tienen en cuenta lo que podra costar con la ayuda del seguro (puede ser ms barato con  su seguro), pero el sitio web puede darle el precio si no Field seismologist.  - Puede imprimir el cupn correspondiente y llevarlo con su receta a la farmacia.  - Tambin puede pasar por nuestra oficina durante el horario de atencin regular y Charity fundraiser una tarjeta de cupones de GoodRx.  - Si necesita que su receta se enve electrnicamente a una farmacia diferente, informe a nuestra oficina a travs de MyChart de [place] o por telfono llamando al [phone] y presione la opcin 4.

## 2022-02-24 NOTE — Progress Notes (Signed)
   Follow-Up Visit   Subjective  Vanessa Casey is a 71 y.o. female who presents for the following: lesions (Check spots on neck and left sideburn area. Raised, rough, irritated) and Skin Cancer (Bx proven BCC's: left upper forehead at hairline, upper mid forehead. Patient prefers to wait on Tx for these ).  The patient has spots, moles and lesions to be evaluated, some may be new or changing and the patient has concerns that these could be cancer.  The following portions of the chart were reviewed this encounter and updated as appropriate:  Tobacco  Allergies  Meds  Problems  Med Hx  Surg Hx  Fam Hx      Review of Systems: No other skin or systemic complaints except as noted in HPI or Assessment and Plan.   Objective  Well appearing patient in no apparent distress; mood and affect are within normal limits.  A focused examination was performed including face, neck. Relevant physical exam findings are noted in the Assessment and Plan.  Left Zygoma x1, left neck x2 (3) Erythematous keratotic or waxy stuck-on papule or plaque.  Left Upper Eyelid Firm flesh colored papule  Left Upper Cutaneous Lip Pink papule   Assessment & Plan  Inflamed seborrheic keratosis (3) Left Zygoma x1, left neck x2  Collision with verruca favored at left sideburn area. Recheck at follow up. Likely biopsy if not resolved.   Symptomatic, irritating, patient would like treated.  Destruction of lesion - Left Zygoma x1, left neck x2  Destruction method: cryotherapy   Informed consent: discussed and consent obtained   Lesion destroyed using liquid nitrogen: Yes   Region frozen until ice ball extended beyond lesion: Yes   Outcome: patient tolerated procedure well with no complications   Post-procedure details: wound care instructions given   Additional details:  Prior to procedure, discussed risks of blister formation, small wound, skin dyspigmentation, or rare scar following cryotherapy. Recommend  Vaseline ointment to treated areas while healing.   Chalazion left upper eyelid Left Upper Eyelid  Benign-appearing.     Recommend follow up with ophthalmologist   Neoplasm of skin Left Upper Cutaneous Lip  Plan Bx at next visit to r/o [facility]. Previous Bx showed angiofibroma   Return in about 1 month (around 03/27/2022) for EDC of BCC's, Recheck ISK Left Zygoma, Bx left upper cutaneous lip.  I, Emelia Salisbury, CMA, am acting as scribe for Forest Gleason, MD.  Documentation: I have reviewed the above documentation for accuracy and completeness, and I agree with the above.  Forest Gleason, MD

## 2022-04-08 ENCOUNTER — Ambulatory Visit: Payer: 59 | Admitting: Dermatology

## 2022-04-28 ENCOUNTER — Telehealth: Payer: Self-pay | Admitting: Family Medicine

## 2022-04-28 NOTE — Telephone Encounter (Signed)
Copied from [place] [id number]. Topic: Medicare AWV >> Apr 28, 2022  2:47 PM Devoria Glassing wrote: Reason for CRM:  Left message for patient to call back and schedule Medicare Annual Wellness Visit (AWV).  Last date of AWV: 04/29/2021  Please schedule an appointment at any time with [facility].  If any questions, please contact me at  . Thank you ,  [place] Direct Dial: [phone]

## 2022-05-05 ENCOUNTER — Ambulatory Visit (INDEPENDENT_AMBULATORY_CARE_PROVIDER_SITE_OTHER): Payer: 59 | Admitting: Internal Medicine

## 2022-05-05 ENCOUNTER — Encounter: Payer: Self-pay | Admitting: Internal Medicine

## 2022-05-05 ENCOUNTER — Other Ambulatory Visit: Payer: Self-pay | Admitting: Internal Medicine

## 2022-05-05 VITALS — BP 144/78 | HR 69 | Temp 97.7°F | Resp 18 | Ht 60.0 in | Wt 167.8 lb

## 2022-05-05 DIAGNOSIS — E781 Pure hyperglyceridemia: Secondary | ICD-10-CM

## 2022-05-05 DIAGNOSIS — Z1211 Encounter for screening for malignant neoplasm of colon: Secondary | ICD-10-CM

## 2022-05-05 DIAGNOSIS — R030 Elevated blood-pressure reading, without diagnosis of hypertension: Secondary | ICD-10-CM

## 2022-05-05 DIAGNOSIS — S76111A Strain of right quadriceps muscle, fascia and tendon, initial encounter: Secondary | ICD-10-CM | POA: Diagnosis not present

## 2022-05-05 MED ORDER — NAPROXEN 500 MG PO TABS: 500.0000 mg | ORAL_TABLET | Freq: Two times a day (BID) | ORAL | 0 refills | Status: DC

## 2022-05-05 MED ORDER — FENOFIBRATE 48 MG PO TABS: 48.0000 mg | ORAL_TABLET | Freq: Every day | ORAL | 1 refills | Status: DC

## 2022-05-05 MED ORDER — NAPROXEN 500 MG PO TABS: 500.0000 mg | ORAL_TABLET | Freq: Two times a day (BID) | ORAL | 0 refills | Status: AC

## 2022-05-05 NOTE — Patient Instructions (Addendum)
It was great seeing you today!  Plan discussed at today's visit: -Hold all blood pressure medications for now, monitor blood pressure at home, write them down and bring to our next appointment -For swelling, recommend keeping elevated legs, avoid sodium and can use compression stockings as well  -For leg pain, recommend taking Naproxen 500 mg twice a day with food for 10 days. Avoid all other anti-inflammatories (Tylenol is ok) -Start Tricor 48 mg daily to help reduce triglycerides. Try to reduce sugars in the diet.   Follow up in: 1 month   Take care and let us know if you have any questions or concerns prior to your next visit.  Dr. Acey Lav Strain  A quadriceps strain is an injury to the muscles or tendons on the front of the thigh. The quadriceps muscles are used in straightening the knee and bending the hip. A strain occurs when the muscle is overstretched or overloaded. There are three types of strains: Grade 1 is a mild strain. It involves a stretching or minor tearing of your muscle fibers or tendons. You should have little, if any, trouble using your thigh. Grade 2 is a moderate strain. It involves a partial tearing of your muscle fibers or tendons. You will have pain and some loss of strength in your thigh. Grade 3 is a severe strain. It involves a complete tearing of your muscle fibers or tendons. It causes severe pain and loss of strength in your thigh. Recovery will take a few weeks or longer, depending on how bad your strain is. What are the causes? This injury is caused by overextending the muscles in the thigh. What increases the risk? The following factors may make you more likely to develop this injury: Participating in: Activities that involve jumping, sprinting, or sudden stopping or twisting. Contact sports, such as football or soccer. Having a previous injury to your thigh or knee. Having poor thigh strength and flexibility. Not warming up properly  before activity. Having one leg that is much stronger than the other. Exercising to the point of exhaustion. What are the signs or symptoms? Symptoms of this condition include: Sudden, severe pain in your thigh. Pain and tenderness over your quadriceps muscles. The pain gets worse when you use these muscles. Muscle spasm in your thigh. Swelling in your thigh. Bruising. Having trouble with tasks that involve using your quadriceps muscles, such as walking. A crackling sound when the tendon is moved or touched. How is this diagnosed? This condition is diagnosed based on: A physical exam. Your medical history. Imaging tests, such as: X-rays. Ultrasound. MRI. How is this treated? Treatment for this condition may include: Resting your leg and avoiding activities that cause pain. Taking medicine to help reduce pain and inflammation. Applying ice to the area to relieve swelling and inflammation. Elevating the leg to reduce or prevent swelling. Applying a compression wrap to the muscle. Using crutches until you can walk without pain. Working with a physical therapist on exercises to improve movement and strength in your thigh. In rare cases, surgery may be needed. Follow these instructions at home: Managing pain, stiffness, and swelling  If directed, put ice on the injured area. To do this: Put ice in a plastic bag. Place a towel between your skin and the bag. Leave the ice on for 20 minutes, 2-3 times a day. Remove the ice if your skin turns bright red. This is very important. If you cannot feel pain, heat, or cold, you have a  greater risk of damage to the area. Raise (elevate) the injured area above the level of your heart while you are sitting or lying down. Activity Do not use the injured leg to support your body weight until your health care provider says that you can. Use crutches as told by your health care provider. Do exercises as told by your health care provider. Return to  your normal activities as told by your health care provider. Ask your health care provider what activities are safe for you. General instructions Take over-the-counter and prescription medicines only as told by your health care provider. Use compression wraps to apply pressure as told by your health care provider. Keep all follow-up visits. This is important. How is this prevented? Warm up and stretch before being active. Cool down and stretch after being active. Give your body time to rest between periods of activity. Maintain physical fitness, including: Strength. Flexibility. Be safe and responsible while being active. This will help you avoid falls. Contact a health care provider if: Your pain, bruising, or tenderness gets worse, even with treatment. Your leg becomes weaker. Summary A quadriceps strain is an injury to the muscles or tendons on the front of the thigh. This injury is caused by overextending the muscles in the thigh. Treatment may include rest, ice, medicines, and physical therapy. In rare cases, surgery may be needed. This information is not intended to replace advice given to you by your health care provider. Make sure you discuss any questions you have with your health care provider. Document Revised: 08/13/2020 Document Reviewed: 08/13/2020 Elsevier Patient Education  [place].  Quadriceps Strain Rehab Ask your health care provider which exercises are safe for you. Do exercises exactly as told by your health care provider and adjust them as directed. It is normal to feel mild stretching, pulling, tightness, or discomfort as you do these exercises. Stop right away if you feel sudden pain or your pain gets worse. Do not begin these exercises until told by your health care provider. Stretching and range-of-motion exercises These exercises warm up your muscles and joints and improve the movement and flexibility of your thigh. These exercises can also help to  relieve stiffness or swelling. Heel slides  Lie on your back with both legs straight. If this causes back discomfort, bend the knee of your healthy leg so your foot is flat on the floor. Slowly slide your left / right heel back toward your buttocks. Stop when you feel a gentle stretch in the front of your knee or thigh (quadriceps). Hold this position for __________ seconds. Slowly slide your left / right heel back to the starting position. Repeat __________ times. Complete this exercise __________ times a day. Quadriceps stretch, prone  Lie on your abdomen on a firm surface, such as a bed or padded floor (prone position). Bend your left / right knee and hold your ankle. If you cannot reach your ankle or pant leg, loop a belt around your foot and grab the belt instead. Gently pull your heel toward your buttocks. Your knee should not slide out to the side. You should feel a stretch in the front of your thigh and knee (quadriceps). Hold this position for __________ seconds. Repeat __________ times. Complete this exercise __________ times a day. Strengthening exercises These exercises build strength and endurance in your thigh. Endurance is the ability to use your muscles for a long time, even after your muscles get tired. Straight leg raises, supine  This exercise stretches  the muscles in front of your thigh (quadriceps) and the muscles that move your hips (hip flexors). Quality counts! Watch for signs that the quadriceps muscle is working to ensure that you are strengthening the correct muscles and not cheating by using healthier muscles. Lie on your back (supine position) with your left / right leg extended and your other knee bent. Tense the muscles in the front of your left / right thigh. You should see your kneecap slide up or see increased dimpling just above the knee. Tighten these muscles even more and raise your leg 4-6 inches (10-15 cm) off the floor. Hold this position for __________  seconds. Keep the thigh muscles tense as you lower your leg. Relax the muscles slowly and completely after each repetition. Repeat __________ times. Complete this exercise __________ times a day. Leg raises, prone This exercise strengthens the muscles that move the hips (hip extensors). Lie on your abdomen on a bed or a firm surface (prone position). Place a pillow under your hips. Bend your left / right knee so your foot is straight up in the air. Squeeze your buttocks muscles and lift your left / right thigh off the bed. Do not let your back arch. Hold this position for __________ seconds. Slowly return to the starting position. Let your muscles relax completely before doing another repetition. Repeat __________ times. Complete this exercise __________ times a day. Wall sits Follow the directions for form closely. Knee pain can occur if your feet or knees are not placed properly. Lean your back against a smooth wall or door, and walk your feet out 18-24 inches (46-61 cm) from it. Place your feet hip-width apart. Slowly slide down the wall or door until your knees bend __________ degrees. Keep your weight back and over your heels, not over your toes. Keep your thighs straight or pointing slightly outward. Hold this position for __________ seconds. Use your thigh and buttocks muscles to push yourself back up to a standing position. Keep your weight through your heels while you do this. Rest for __________ seconds after each repetition. Repeat __________ times. Complete this exercise __________ times a day. This information is not intended to replace advice given to you by your health care provider. Make sure you discuss any questions you have with your health care provider. Document Revised: 08/13/2020 Document Reviewed: 08/13/2020 Elsevier Patient Education  [place].

## 2022-05-05 NOTE — Progress Notes (Signed)
New Patient Office Visit  Subjective    Patient ID: Vanessa Casey, female    DOB: 08-Dec-1950  Age: 72 y.o. MRN: [id number]  CC:  Chief Complaint  Patient presents with   Establish Care   Knee Pain    Right onset 3 weeks ago, sometimes can hardly walk    HPI Dorrian Shook presents to establish care.  Hypertension: -Medications: HCTZ 25 mg, Losartan 10 mg daily but not taking anything right now  -Checking BP at home (average): not checking regularly  -Denies any SOB, CP, vision changes, LE edema or symptoms of hypotension  KNEE PAIN Duration:  3 months  Involved knee: right Mechanism of injury:  was taking care of her ill sister, helping turn and lift her for the last several weeks Location:medial Onset: gradual Quality:  dull and aching Frequency: intermittent Radiation: no Aggravating factors: weight bearing and walking  Alleviating factors: NSAIDs  Status: stable Weakness with weight bearing or walking: no Sensation of giving way: no Locking: no Popping: no Bruising: no Swelling: no Redness: no Paresthesias/decreased sensation: no  History of multiple basal cell carcinomas removed: -Last of which was in September 2023 on her face and forehead -Following with dermatology on a yearly basis.  Hypertriglyceridemia: -Not currently on medication -Last lipid panel Lipid Panel     Component Value Date/Time   CHOL 197 12/03/2021 1046   TRIG 363 (H) 12/03/2021 1046   HDL 41 12/03/2021 1046   LDLCALC 95 12/03/2021 1046   LABVLDL 61 (H) 12/03/2021 1046   The 10-year ASCVD risk score (Arnett DK, et al., 2019) is: 16.5%   Values used to calculate the score:     Age: 40 years     Sex: Female     Is Non-Hispanic African American: No     Diabetic: No     Tobacco smoker: No     Systolic Blood Pressure: 123456 mmHg     Is BP treated: Yes     HDL Cholesterol: 41 mg/dL     Total Cholesterol: 197 mg/dL   Health Maintenance: -Blood work UTD -Mammogram 10/23  Birads-1 -Colon cancer screening due  -Prevnar 20 vaccine due, will obtain at follow-up  Outpatient Encounter Medications as of 05/05/2022  Medication Sig   hydrochlorothiazide (HYDRODIURIL) 25 MG tablet TAKE 1 TABLET(25 MG) BY MOUTH DAILY   ibuprofen (ADVIL) 600 MG tablet Take 1 tablet (600 mg total) by mouth every 8 (eight) hours as needed.   valsartan (DIOVAN) 40 MG tablet Take 0.5 tablets (20 mg total) by mouth daily.   [DISCONTINUED] benzonatate (TESSALON) 200 MG capsule Take 1 capsule (200 mg total) by mouth 2 (two) times daily as needed for cough.   [DISCONTINUED] ondansetron (ZOFRAN) 4 MG tablet Take 1 tablet (4 mg total) by mouth every 8 (eight) hours as needed for nausea or vomiting.   No facility-administered encounter medications on file as of 05/05/2022.    Past Medical History:  Diagnosis Date   Acute cholecystitis 03/04/2018   Atypical mole 07/30/2017   left breast/excision   Basal cell carcinoma 07/30/2017   right jaw   Basal cell carcinoma 09/16/2017   left upper arm/excision   Basal cell carcinoma 11/22/2018   right upper cutaneous lip   BCC (basal cell carcinoma of skin) 11/13/2021   upper mid forehead (Nodular)  needs referral for MOHS   BCC (basal cell carcinoma of skin) 11/13/2021   left upper forehead at hairline (superficial)  needs treatment schedule MOHS or ED&C  Edema    FEET/ LEGS   Hypertension    Medical history non-contributory    Post-operative nausea and vomiting 04/30/2017   Shingles     Past Surgical History:  Procedure Laterality Date   BASAL CELL CARCINOMA EXCISION     CATARACT EXTRACTION W/PHACO Right 05/05/2017   Procedure: CATARACT EXTRACTION PHACO AND INTRAOCULAR LENS PLACEMENT ([place]);  Surgeon: Birder Robson, MD;  Location: [facility] ORS;  Service: Ophthalmology;  Laterality: Right;  [place] 00:33.2 AP% 15.8 CDE 5.25 Fluid Pack Lot # [id number]   CATARACT EXTRACTION W/PHACO Left 06/09/2017   Procedure: CATARACT EXTRACTION PHACO AND  INTRAOCULAR LENS PLACEMENT (IOC);  Surgeon: Birder Robson, MD;  Location: [facility] ORS;  Service: Ophthalmology;  Laterality: Left;  [place] 01:02.2 AP% 15.2 CDE 9.48 Fluid Pack Lot # [id number]   CHOLECYSTECTOMY N/A 03/04/2018   Procedure: LAPAROSCOPIC CHOLECYSTECTOMY;  Surgeon: Jules Husbands, MD;  Location: [facility] ORS;  Service: General;  Laterality: N/A;   TOTAL ABDOMINAL HYSTERECTOMY      Family History  Problem Relation Age of Onset   Dementia Mother    Heart failure Father    Heart attack Sister    Heart attack Brother    Breast cancer Maternal Aunt     Social History   Socioeconomic History   Marital status: Single    Spouse name: Not on file   Number of children: Not on file   Years of education: Not on file   Highest education level: Not on file  Occupational History   Not on file  Tobacco Use   Smoking status: Never   Smokeless tobacco: Never  Vaping Use   Vaping Use: Never used  Substance and Sexual Activity   Alcohol use: No   Drug use: No   Sexual activity: Not Currently    Birth control/protection: Post-menopausal  Other Topics Concern   Not on file  Social History Narrative   Not on file   Social Determinants of Health   Financial Resource Strain: Low Risk  (04/29/2021)   Overall Financial Resource Strain (CARDIA)    Difficulty of Paying Living Expenses: Not hard at all  Food Insecurity: No Food Insecurity (04/29/2021)   Hunger Vital Sign    Worried About Running Out of Food in the Last Year: Never true    Ran Out of Food in the Last Year: Never true  Transportation Needs: No Transportation Needs (04/29/2021)   PRAPARE - Hydrologist (Medical): No    Lack of Transportation (Non-Medical): No  Physical Activity: Insufficiently Active (04/29/2021)   Exercise Vital Sign    Days of Exercise per Week: 3 days    Minutes of Exercise per Session: 30 min  Stress: No Stress Concern Present (04/29/2021)   [place]    Feeling of Stress : Not at all  Social Connections: Unknown (04/29/2021)   Social Connection and Isolation Panel [NHANES]    Frequency of Communication with Friends and Family: More than three times a week    Frequency of Social Gatherings with Friends and Family: More than three times a week    Attends Religious Services: Never    Marine scientist or Organizations: No    Attends Archivist Meetings: Never    Marital Status: Not on file  Intimate Partner Violence: Not At Risk (04/29/2021)   Humiliation, Afraid, Rape, and Kick questionnaire    Fear of Current or Ex-Partner: No  Emotionally Abused: No    Physically Abused: No    Sexually Abused: No    Review of Systems  Constitutional:  Negative for chills and fever.  Eyes:  Negative for blurred vision.  Respiratory:  Negative for shortness of breath.   Musculoskeletal:  Positive for joint pain.  Neurological:  Negative for headaches.        Objective    BP (!) 144/78   Pulse 69   Temp 97.7 F (36.5 C)   Resp 18   Ht 5' (1.524 m)   Wt 167 lb 12.8 oz (76.1 kg)   SpO2 97%   BMI 32.77 kg/m   Physical Exam Constitutional:      Appearance: Normal appearance.  HENT:     Head: Normocephalic and atraumatic.  Eyes:     Conjunctiva/sclera: Conjunctivae normal.  Cardiovascular:     Rate and Rhythm: Normal rate and regular rhythm.  Pulmonary:     Effort: Pulmonary effort is normal.     Breath sounds: Normal breath sounds.  Musculoskeletal:     Right knee: No swelling or erythema. Normal range of motion. Tenderness present. No medial joint line, lateral joint line, MCL, LCL, ACL, PCL or patellar tendon tenderness.     Left knee: Normal.     Right lower leg: No edema.     Left lower leg: No edema.     Comments: Tenderness at quadriceps femoris insertion point  Skin:    General: Skin is warm and dry.  Neurological:     General: No focal deficit present.      Mental Status: She is alert. Mental status is at baseline.  Psychiatric:        Mood and Affect: Mood normal.        Behavior: Behavior normal.         Assessment & Plan:   1. Quadriceps muscle strain, right, initial encounter: Discussed conservative treatment with rest, moist heat and gentle stretching.  Patient was given a temporary handicap placard.  Will treat with anti-inflammatories naproxen 500 mg twice daily for 10 days.  Quadriceps strengthening exercises provided to the patient to start when acute strain has resolved.  - naproxen (NAPROSYN) 500 MG tablet; Take 1 tablet (500 mg total) by mouth 2 (two) times daily with a meal for 10 days.  Dispense: 20 tablet; Refill: 0  2. Elevated blood pressure reading: Given the patient has 3 blood pressure medications prescribed, she is not currently taking any.  Her blood pressure is mildly elevated today but states she is nervous about being in this office for the first time.  Will hold all blood pressure medications and patient will start checking blood pressure at home daily.  She will write these down and follow-up in our office for blood pressure recheck in 1 month.  3. Hypertriglyceridemia: Reviewed labs from September with the patient which showed elevated triglycerides.  Overall ASCVD risk is elevated at 16%, will start treatment with Tricor 48 mg daily and plan to recheck triglycerides in the fall, most likely will require statin at that time.  Also discussed lifestyle and dietary changes.  - fenofibrate (TRICOR) 48 MG tablet; Take 1 tablet (48 mg total) by mouth daily.  Dispense: 30 tablet; Refill: 1  4. Colon cancer screening: Cologuard ordered.  - Cologuard   Return in 4 weeks (on 06/02/2022).   Teodora Medici, DO

## 2022-05-06 NOTE — Telephone Encounter (Signed)
Requested medication (s) are due for refill today - no  Requested medication (s) are on the active medication list -yes  Future visit scheduled -yes  Last refill: 05/05/22  Notes to clinic: Request requires changes to original Rx- does not cover 90 day supply  Requested Prescriptions  Pending Prescriptions Disp Refills   fenofibrate (TRICOR) 48 MG tablet [Pharmacy Med Name: FENOFIBRATE '48MG'$  TABLETS] 90 tablet     Sig: TAKE 1 TABLET(48 MG) BY MOUTH DAILY     Cardiovascular:  Antilipid - Fibric Acid Derivatives Failed - 05/05/2022  2:44 PM      Failed - Lipid Panel in normal range within the last 12 months    Cholesterol, Total  Date Value Ref Range Status  12/03/2021 197 100 - 199 mg/dL Final   LDL Chol Calc (NIH)  Date Value Ref Range Status  12/03/2021 95 0 - 99 mg/dL Final   HDL  Date Value Ref Range Status  12/03/2021 41 >39 mg/dL Final   Triglycerides  Date Value Ref Range Status  12/03/2021 363 (H) 0 - 149 mg/dL Final         Passed - ALT in normal range and within 360 days    ALT  Date Value Ref Range Status  12/03/2021 11 0 - 32 IU/L Final         Passed - AST in normal range and within 360 days    AST  Date Value Ref Range Status  12/03/2021 17 0 - 40 IU/L Final         Passed - Cr in normal range and within 360 days    Creatinine, Ser  Date Value Ref Range Status  12/31/2021 0.84 0.57 - 1.00 mg/dL Final         Passed - HGB in normal range and within 360 days    Hemoglobin  Date Value Ref Range Status  12/03/2021 11.5 11.1 - 15.9 g/dL Final         Passed - HCT in normal range and within 360 days    Hematocrit  Date Value Ref Range Status  12/03/2021 34.7 34.0 - 46.6 % Final         Passed - PLT in normal range and within 360 days    Platelets  Date Value Ref Range Status  12/03/2021 269 150 - 450 x10E3/uL Final         Passed - WBC in normal range and within 360 days    WBC  Date Value Ref Range Status  12/03/2021 7.3 3.4 - 10.8  x10E3/uL Final  03/04/2018 10.8 (H) 4.0 - 10.5 K/uL Final         Passed - eGFR is 30 or above and within 360 days    GFR calc Af Amer  Date Value Ref Range Status  07/14/2019 95 >59 mL/min/1.73 Final    Comment:    **[facility] currently reports eGFR in compliance with the current**   recommendations of the [employer]. [facility] will   update reporting as new guidelines are published from the NKF-ASN   Task force.    GFR calc non Af Amer  Date Value Ref Range Status  07/14/2019 83 >59 mL/min/1.73 Final   eGFR  Date Value Ref Range Status  12/31/2021 75 >59 mL/min/1.73 Final         Passed - Valid encounter within last 12 months    Recent Outpatient Visits           Yesterday Elevated blood  pressure reading   [place], DO   3 months ago Close exposure to COVID-19 virus   [place], Megan P, DO   4 months ago Primary hypertension   [place], [place], DO   5 months ago Routine general medical examination at a health care facility   [facility] [place], [place], DO   9 months ago Primary hypertension   [place], Barb Merino, DO       Future Appointments             In 1 month Teodora Medici, [facility], [facility]               Requested Prescriptions  Pending Prescriptions Disp Refills   fenofibrate (TRICOR) 48 MG tablet [Pharmacy Med Name: FENOFIBRATE '48MG'$  TABLETS] 90 tablet     Sig: TAKE 1 TABLET(48 MG) BY MOUTH DAILY     Cardiovascular:  Antilipid - Fibric Acid Derivatives Failed - 05/05/2022  2:44 PM      Failed - Lipid Panel in normal range within the last 12 months    Cholesterol, Total  Date Value Ref Range Status  12/03/2021 197 100 - 199 mg/dL Final   LDL Chol Calc (NIH)  Date Value Ref Range Status  12/03/2021 95 0 - 99 mg/dL  Final   HDL  Date Value Ref Range Status  12/03/2021 41 >39 mg/dL Final   Triglycerides  Date Value Ref Range Status  12/03/2021 363 (H) 0 - 149 mg/dL Final         Passed - ALT in normal range and within 360 days    ALT  Date Value Ref Range Status  12/03/2021 11 0 - 32 IU/L Final         Passed - AST in normal range and within 360 days    AST  Date Value Ref Range Status  12/03/2021 17 0 - 40 IU/L Final         Passed - Cr in normal range and within 360 days    Creatinine, Ser  Date Value Ref Range Status  12/31/2021 0.84 0.57 - 1.00 mg/dL Final         Passed - HGB in normal range and within 360 days    Hemoglobin  Date Value Ref Range Status  12/03/2021 11.5 11.1 - 15.9 g/dL Final         Passed - HCT in normal range and within 360 days    Hematocrit  Date Value Ref Range Status  12/03/2021 34.7 34.0 - 46.6 % Final         Passed - PLT in normal range and within 360 days    Platelets  Date Value Ref Range Status  12/03/2021 269 150 - 450 x10E3/uL Final         Passed - WBC in normal range and within 360 days    WBC  Date Value Ref Range Status  12/03/2021 7.3 3.4 - 10.8 x10E3/uL Final  03/04/2018 10.8 (H) 4.0 - 10.5 K/uL Final         Passed - eGFR is 30 or above and within 360 days    GFR calc Af Amer  Date Value Ref Range Status  07/14/2019 95 >59 mL/min/1.73 Final    Comment:    **[facility] currently reports eGFR in compliance with the current**   recommendations  of the [employer]. [facility] will   update reporting as new guidelines are published from the NKF-ASN   Task force.    GFR calc non Af Amer  Date Value Ref Range Status  07/14/2019 83 >59 mL/min/1.73 Final   eGFR  Date Value Ref Range Status  12/31/2021 75 >59 mL/min/1.73 Final         Passed - Valid encounter within last 12 months    Recent Outpatient Visits           Yesterday Elevated blood pressure reading   [place], DO   3 months ago Close exposure to COVID-19 virus   [place], [place], DO   4 months ago Primary hypertension   [place], [place], DO   5 months ago Routine general medical examination at a health care facility   [place], [place], DO   9 months ago Primary hypertension   [place], [place], DO       Future Appointments             In 1 month Teodora Medici, [facility], [facility]

## 2022-05-19 ENCOUNTER — Other Ambulatory Visit: Payer: Self-pay | Admitting: Internal Medicine

## 2022-05-19 DIAGNOSIS — S76111A Strain of right quadriceps muscle, fascia and tendon, initial encounter: Secondary | ICD-10-CM

## 2022-05-20 NOTE — Telephone Encounter (Signed)
Unable to refill per protocol, Rx expired.   Requested Prescriptions  Pending Prescriptions Disp Refills   naproxen (NAPROSYN) 500 MG tablet [Pharmacy Med Name: NAPROXEN '500MG'$  TABLETS] 30 tablet 0    Sig: TAKE 1 TABLET(500 MG) BY MOUTH TWICE DAILY WITH A MEAL FOR 15 DAYS     Analgesics:  NSAIDS Failed - 05/19/2022  3:35 AM      Failed - Manual Review: Labs are only required if the patient has taken medication for more than 8 weeks.      Passed - Cr in normal range and within 360 days    Creatinine, Ser  Date Value Ref Range Status  12/31/2021 0.84 0.57 - 1.00 mg/dL Final         Passed - HGB in normal range and within 360 days    Hemoglobin  Date Value Ref Range Status  12/03/2021 11.5 11.1 - 15.9 g/dL Final         Passed - PLT in normal range and within 360 days    Platelets  Date Value Ref Range Status  12/03/2021 269 150 - 450 x10E3/uL Final         Passed - HCT in normal range and within 360 days    Hematocrit  Date Value Ref Range Status  12/03/2021 34.7 34.0 - 46.6 % Final         Passed - eGFR is 30 or above and within 360 days    GFR calc Af Amer  Date Value Ref Range Status  07/14/2019 95 >59 mL/min/1.73 Final    Comment:    **[facility] currently reports eGFR in compliance with the current**   recommendations of the [employer]. [facility] will   update reporting as new guidelines are published from the NKF-ASN   Task force.    GFR calc non Af Amer  Date Value Ref Range Status  07/14/2019 83 >59 mL/min/1.73 Final   eGFR  Date Value Ref Range Status  12/31/2021 75 >59 mL/min/1.73 Final         Passed - Patient is not pregnant      Passed - Valid encounter within last 12 months    Recent Outpatient Visits           2 weeks ago Elevated blood pressure reading   [place], DO   3 months ago Close exposure to COVID-19 virus   [place], [place], DO   4  months ago Primary hypertension   [place], [place], DO   5 months ago Routine general medical examination at a health care facility   [place], [place], DO   10 months ago Primary hypertension   [place], Barb Merino, DO       Future Appointments             In 3 weeks Teodora Medici, [facility], [facility]

## 2022-05-27 ENCOUNTER — Other Ambulatory Visit: Payer: Self-pay | Admitting: Internal Medicine

## 2022-05-27 DIAGNOSIS — S76111A Strain of right quadriceps muscle, fascia and tendon, initial encounter: Secondary | ICD-10-CM

## 2022-05-27 NOTE — Telephone Encounter (Signed)
Unable to refill per protocol, Rx expired. Not on current medication list.  Requested Prescriptions  Pending Prescriptions Disp Refills   naproxen (NAPROSYN) 500 MG tablet [Pharmacy Med Name: NAPROXEN 500MG  TABLETS] 20 tablet 0    Sig: TAKE 1 TABLET(500 MG) BY MOUTH TWICE DAILY WITH A MEAL FOR 10 DAYS     Analgesics:  NSAIDS Failed - 05/27/2022  3:33 AM      Failed - Manual Review: Labs are only required if the patient has taken medication for more than 8 weeks.      Passed - Cr in normal range and within 360 days    Creatinine, Ser  Date Value Ref Range Status  12/31/2021 0.84 0.57 - 1.00 mg/dL Final         Passed - HGB in normal range and within 360 days    Hemoglobin  Date Value Ref Range Status  12/03/2021 11.5 11.1 - 15.9 g/dL Final         Passed - PLT in normal range and within 360 days    Platelets  Date Value Ref Range Status  12/03/2021 269 150 - 450 x10E3/uL Final         Passed - HCT in normal range and within 360 days    Hematocrit  Date Value Ref Range Status  12/03/2021 34.7 34.0 - 46.6 % Final         Passed - eGFR is 30 or above and within 360 days    GFR calc Af Amer  Date Value Ref Range Status  07/14/2019 95 >59 mL/min/1.73 Final    Comment:    **[facility] currently reports eGFR in compliance with the current**   recommendations of the [employer]. [facility] will   update reporting as new guidelines are published from the NKF-ASN   Task force.    GFR calc non Af Amer  Date Value Ref Range Status  07/14/2019 83 >59 mL/min/1.73 Final   eGFR  Date Value Ref Range Status  12/31/2021 75 >59 mL/min/1.73 Final         Passed - Patient is not pregnant      Passed - Valid encounter within last 12 months    Recent Outpatient Visits           3 weeks ago Elevated blood pressure reading   [place], DO   3 months ago Close exposure to COVID-19 virus   [place], [place], DO   4 months ago Primary hypertension   [place], [place], DO   5 months ago Routine general medical examination at a health care facility   [place], [place], DO   10 months ago Primary hypertension   [place], Barb Merino, DO       Future Appointments             In 2 weeks Teodora Medici, [facility], [facility]

## 2022-06-02 ENCOUNTER — Ambulatory Visit: Payer: Medicare Other | Admitting: Family Medicine

## 2022-06-09 NOTE — Progress Notes (Unsigned)
Established Patient Office Visit  Subjective    Patient ID: Vanessa Casey, female    DOB: May 27, 1950  Age: 72 y.o. MRN: [id number]  CC:  No chief complaint on file.   HPI Vanessa Casey presents to follow up on blood pressure.   Hypertension: -Medications: HCTZ 25 mg, Losartan 10 mg daily but not taking anything right now  -Checking BP at home (average): not checking regularly  -Denies any SOB, CP, vision changes, LE edema or symptoms of hypotension  History of multiple basal cell carcinomas removed: -Last of which was in September 2023 on her face and forehead -Following with dermatology on a yearly basis.  Hypertriglyceridemia: -Started on tricor 48 mg at LOV -Last lipid panel Lipid Panel     Component Value Date/Time   CHOL 197 12/03/2021 1046   TRIG 363 (H) 12/03/2021 1046   HDL 41 12/03/2021 1046   LDLCALC 95 12/03/2021 1046   LABVLDL 61 (H) 12/03/2021 1046   The 10-year ASCVD risk score (Arnett DK, et al., 2019) is: 16.5%   Values used to calculate the score:     Age: 21 years     Sex: Female     Is Non-Hispanic African American: No     Diabetic: No     Tobacco smoker: No     Systolic Blood Pressure: 123456 mmHg     Is BP treated: Yes     HDL Cholesterol: 41 mg/dL     Total Cholesterol: 197 mg/dL   Health Maintenance: -Blood work UTD -Mammogram 10/23 Birads-1 -Colon cancer screening due  -Prevnar 20 vaccine due, will obtain at follow-up  Outpatient Encounter Medications as of 06/10/2022  Medication Sig   fenofibrate (TRICOR) 48 MG tablet Take 1 tablet (48 mg total) by mouth daily.   hydrochlorothiazide (HYDRODIURIL) 25 MG tablet TAKE 1 TABLET(25 MG) BY MOUTH DAILY   ibuprofen (ADVIL) 600 MG tablet Take 1 tablet (600 mg total) by mouth every 8 (eight) hours as needed.   valsartan (DIOVAN) 40 MG tablet Take 0.5 tablets (20 mg total) by mouth daily.   No facility-administered encounter medications on file as of 06/10/2022.    Past Medical History:  Diagnosis  Date   Acute cholecystitis 03/04/2018   Atypical mole 07/30/2017   left breast/excision   Basal cell carcinoma 07/30/2017   right jaw   Basal cell carcinoma 09/16/2017   left upper arm/excision   Basal cell carcinoma 11/22/2018   right upper cutaneous lip   BCC (basal cell carcinoma of skin) 11/13/2021   upper mid forehead (Nodular)  needs referral for MOHS   BCC (basal cell carcinoma of skin) 11/13/2021   left upper forehead at hairline (superficial)  needs treatment schedule MOHS or ED&C   Edema    FEET/ LEGS   Hypertension    Medical history non-contributory    Post-operative nausea and vomiting 04/30/2017   Shingles     Past Surgical History:  Procedure Laterality Date   BASAL CELL CARCINOMA EXCISION     CATARACT EXTRACTION W/PHACO Right 05/05/2017   Procedure: CATARACT EXTRACTION PHACO AND INTRAOCULAR LENS PLACEMENT ([place]);  Surgeon: Birder Robson, MD;  Location: [facility] ORS;  Service: Ophthalmology;  Laterality: Right;  [place] 00:33.2 AP% 15.8 CDE 5.25 Fluid Pack Lot # [id number]   CATARACT EXTRACTION W/PHACO Left 06/09/2017   Procedure: CATARACT EXTRACTION PHACO AND INTRAOCULAR LENS PLACEMENT ([facility]);  Surgeon: Birder Robson, MD;  Location: [facility] ORS;  Service: Ophthalmology;  Laterality: Left;  [place] 01:02.2 AP% 15.2 CDE 9.48 Fluid  Pack Lot # [id number]   CHOLECYSTECTOMY N/A 03/04/2018   Procedure: LAPAROSCOPIC CHOLECYSTECTOMY;  Surgeon: Jules Husbands, MD;  Location: [facility] ORS;  Service: General;  Laterality: N/A;   TOTAL ABDOMINAL HYSTERECTOMY      Family History  Problem Relation Age of Onset   Dementia Mother    Heart failure Father    Heart attack Sister    Heart attack Brother    Breast cancer Maternal Aunt     Social History   Socioeconomic History   Marital status: Single    Spouse name: Not on file   Number of children: Not on file   Years of education: Not on file   Highest education level: Not on file  Occupational History   Not on file  Tobacco Use    Smoking status: Never   Smokeless tobacco: Never  Vaping Use   Vaping Use: Never used  Substance and Sexual Activity   Alcohol use: No   Drug use: No   Sexual activity: Not Currently    Birth control/protection: Post-menopausal  Other Topics Concern   Not on file  Social History Narrative   Not on file   Social Determinants of Health   Financial Resource Strain: Low Risk  (04/29/2021)   Overall Financial Resource Strain (CARDIA)    Difficulty of Paying Living Expenses: Not hard at all  Food Insecurity: No Food Insecurity (04/29/2021)   Hunger Vital Sign    Worried About Running Out of Food in the Last Year: Never true    Ran Out of Food in the Last Year: Never true  Transportation Needs: No Transportation Needs (04/29/2021)   PRAPARE - Hydrologist (Medical): No    Lack of Transportation (Non-Medical): No  Physical Activity: Insufficiently Active (04/29/2021)   Exercise Vital Sign    Days of Exercise per Week: 3 days    Minutes of Exercise per Session: 30 min  Stress: No Stress Concern Present (04/29/2021)   [place]    Feeling of Stress : Not at all  Social Connections: Unknown (04/29/2021)   Social Connection and Isolation Panel [NHANES]    Frequency of Communication with Friends and Family: More than three times a week    Frequency of Social Gatherings with Friends and Family: More than three times a week    Attends Religious Services: Never    Marine scientist or Organizations: No    Attends Archivist Meetings: Never    Marital Status: Not on file  Intimate Partner Violence: Not At Risk (04/29/2021)   Humiliation, Afraid, Rape, and Kick questionnaire    Fear of Current or Ex-Partner: No    Emotionally Abused: No    Physically Abused: No    Sexually Abused: No    Review of Systems  Constitutional:  Negative for chills and fever.  Eyes:  Negative for  blurred vision.  Respiratory:  Negative for shortness of breath.   Musculoskeletal:  Positive for joint pain.  Neurological:  Negative for headaches.        Objective    There were no vitals taken for this visit.  Physical Exam Constitutional:      Appearance: Normal appearance.  HENT:     Head: Normocephalic and atraumatic.  Eyes:     Conjunctiva/sclera: Conjunctivae normal.  Cardiovascular:     Rate and Rhythm: Normal rate and regular rhythm.  Pulmonary:     Effort: Pulmonary  effort is normal.     Breath sounds: Normal breath sounds.  Musculoskeletal:     Right knee: No swelling or erythema. Normal range of motion. Tenderness present. No medial joint line, lateral joint line, MCL, LCL, ACL, PCL or patellar tendon tenderness.     Left knee: Normal.     Right lower leg: No edema.     Left lower leg: No edema.     Comments: Tenderness at quadriceps femoris insertion point  Skin:    General: Skin is warm and dry.  Neurological:     General: No focal deficit present.     Mental Status: She is alert. Mental status is at baseline.  Psychiatric:        Mood and Affect: Mood normal.        Behavior: Behavior normal.         Assessment & Plan:   1. Quadriceps muscle strain, right, initial encounter: Discussed conservative treatment with rest, moist heat and gentle stretching.  Patient was given a temporary handicap placard.  Will treat with anti-inflammatories naproxen 500 mg twice daily for 10 days.  Quadriceps strengthening exercises provided to the patient to start when acute strain has resolved.  - naproxen (NAPROSYN) 500 MG tablet; Take 1 tablet (500 mg total) by mouth 2 (two) times daily with a meal for 10 days.  Dispense: 20 tablet; Refill: 0  2. Elevated blood pressure reading: Given the patient has 3 blood pressure medications prescribed, she is not currently taking any.  Her blood pressure is mildly elevated today but states she is nervous about being in this  office for the first time.  Will hold all blood pressure medications and patient will start checking blood pressure at home daily.  She will write these down and follow-up in our office for blood pressure recheck in 1 month.  3. Hypertriglyceridemia: Reviewed labs from September with the patient which showed elevated triglycerides.  Overall ASCVD risk is elevated at 16%, will start treatment with Tricor 48 mg daily and plan to recheck triglycerides in the fall, most likely will require statin at that time.  Also discussed lifestyle and dietary changes.  - fenofibrate (TRICOR) 48 MG tablet; Take 1 tablet (48 mg total) by mouth daily.  Dispense: 30 tablet; Refill: 1  4. Colon cancer screening: Cologuard ordered.  - Cologuard   No follow-ups on file.   Teodora Medici, DO

## 2022-06-10 ENCOUNTER — Encounter: Payer: Self-pay | Admitting: Internal Medicine

## 2022-06-10 ENCOUNTER — Ambulatory Visit (INDEPENDENT_AMBULATORY_CARE_PROVIDER_SITE_OTHER): Payer: 59 | Admitting: Internal Medicine

## 2022-06-10 VITALS — BP 130/78 | HR 68 | Temp 97.9°F | Resp 18 | Ht 60.0 in | Wt 164.5 lb

## 2022-06-10 DIAGNOSIS — R300 Dysuria: Secondary | ICD-10-CM | POA: Diagnosis not present

## 2022-06-10 DIAGNOSIS — E781 Pure hyperglyceridemia: Secondary | ICD-10-CM

## 2022-06-10 DIAGNOSIS — N3090 Cystitis, unspecified without hematuria: Secondary | ICD-10-CM

## 2022-06-10 DIAGNOSIS — H029 Unspecified disorder of eyelid: Secondary | ICD-10-CM | POA: Diagnosis not present

## 2022-06-10 DIAGNOSIS — I10 Essential (primary) hypertension: Secondary | ICD-10-CM

## 2022-06-10 LAB — POCT URINALYSIS DIPSTICK
Bilirubin, UA: NEGATIVE
Blood, UA: POSITIVE
Glucose, UA: NEGATIVE
Ketones, UA: NEGATIVE
Nitrite, UA: POSITIVE
Protein, UA: NEGATIVE
Spec Grav, UA: 1.015 (ref 1.010–1.025)
Urobilinogen, UA: 0.2 E.U./dL
pH, UA: 6.5 (ref 5.0–8.0)

## 2022-06-10 MED ORDER — HYDROCHLOROTHIAZIDE 12.5 MG PO CAPS: 12.5000 mg | ORAL_CAPSULE | Freq: Every day | ORAL | 0 refills | Status: DC

## 2022-06-10 MED ORDER — SULFAMETHOXAZOLE-TRIMETHOPRIM 800-160 MG PO TABS: 1.0000 | ORAL_TABLET | Freq: Two times a day (BID) | ORAL | 0 refills | Status: AC

## 2022-06-10 NOTE — Patient Instructions (Addendum)
It was great seeing you today!  Plan discussed at today's visit: -Change blood pressure medication to HCTZ 12.5 mg daily  -Continue to monitor blood pressure at home, if <110/65 and if you have symptoms of feeling more tired than normal, dizzy or feeling like you are going to pass out, please let me know  Follow up in: 3 months  Take care and let us know if you have any questions or concerns prior to your next visit.  Dr. Rosana Berger

## 2022-06-12 ENCOUNTER — Ambulatory Visit: Payer: 59 | Admitting: Dermatology

## 2022-06-13 LAB — URINE CULTURE
MICRO NUMBER:: 14772255
SPECIMEN QUALITY:: ADEQUATE

## 2022-06-13 MED ORDER — NITROFURANTOIN MONOHYD MACRO 100 MG PO CAPS: 100.0000 mg | ORAL_CAPSULE | Freq: Two times a day (BID) | ORAL | 0 refills | Status: AC

## 2022-06-13 NOTE — Addendum Note (Signed)
Addended by: Margarita Mail on: 06/13/2022 12:51 PM   Modules accepted: Orders

## 2022-06-20 ENCOUNTER — Other Ambulatory Visit: Payer: Self-pay | Admitting: Internal Medicine

## 2022-06-20 ENCOUNTER — Telehealth: Payer: Self-pay | Admitting: Internal Medicine

## 2022-06-20 DIAGNOSIS — M25569 Pain in unspecified knee: Secondary | ICD-10-CM

## 2022-06-20 MED ORDER — NAPROXEN 500 MG PO TABS: 500.0000 mg | ORAL_TABLET | Freq: Two times a day (BID) | ORAL | 0 refills | Status: AC

## 2022-06-20 NOTE — Telephone Encounter (Signed)
Left detailed vm °

## 2022-06-20 NOTE — Telephone Encounter (Signed)
Pt is calling in because she was previously prescribed a medication for her knee and she is wondering if she could be prescribed some more. Pt says she has an appointment in July, but her knee is currently bothering her. Pt doesn't remember the name of the medication, but says Dr. Caralee Ates should know. Please advise.

## 2022-06-20 NOTE — Telephone Encounter (Signed)
Wants refill on naproxen?

## 2022-06-28 ENCOUNTER — Other Ambulatory Visit: Payer: Self-pay | Admitting: Internal Medicine

## 2022-06-28 DIAGNOSIS — M25569 Pain in unspecified knee: Secondary | ICD-10-CM

## 2022-06-30 NOTE — Telephone Encounter (Signed)
Requested medication (s) are due for refill today: yes  Requested medication (s) are on the active medication list: yes  Last refill:  06/20/22  Future visit scheduled: yes  Notes to clinic:  Unable to refill per protocol, Medication was given for short supply, should patient continue to take? Routing for review.      Requested Prescriptions  Pending Prescriptions Disp Refills   naproxen (NAPROSYN) 500 MG tablet [Pharmacy Med Name: NAPROXEN  TABLETS] 20 tablet 0    Sig: TAKE 1 TABLET(500 MG) BY MOUTH TWICE DAILY WITH A MEAL FOR 10 DAYS     Analgesics:  NSAIDS Failed - 06/28/2022  3:50 AM      Failed - Manual Review: Labs are only required if the patient has taken medication for more than 8 weeks.      Passed - Cr in normal range and within 360 days    Creatinine, Ser  Date Value Ref Range Status  12/31/2021 0.84 0.57 - 1.00 mg/dL Final         Passed - HGB in normal range and within 360 days    Hemoglobin  Date Value Ref Range Status  12/03/2021 11.5 11.1 - 15.9 g/dL Final         Passed - PLT in normal range and within 360 days    Platelets  Date Value Ref Range Status  12/03/2021 269 150 - 450 x10E3/uL Final         Passed - HCT in normal range and within 360 days    Hematocrit  Date Value Ref Range Status  12/03/2021 34.7 34.0 - 46.6 % Final         Passed - eGFR is 30 or above and within 360 days    GFR calc Af Amer  Date Value Ref Range Status  07/14/2019 95 >59 mL/min/1.73 Final    Comment:    **[facility] currently reports eGFR in compliance with the current**   recommendations of the [employer]. [facility] will   update reporting as new guidelines are published from the NKF-ASN   Task force.    GFR calc non Af Amer  Date Value Ref Range Status  07/14/2019 83 >59 mL/min/1.73 Final   eGFR  Date Value Ref Range Status  12/31/2021 75 >59 mL/min/1.73 Final         Passed - Patient is not pregnant      Passed - Valid encounter within  last 12 months    Recent Outpatient Visits           2 weeks ago Cystitis   [facility] Margarita Mail, DO   1 month ago Elevated blood pressure reading   [facility] Margarita Mail, DO   4 months ago Close exposure to COVID-19 virus   [place] [place], Megan P, DO   6 months ago Primary hypertension   [place] [facility] [place], Megan P, DO   6 months ago Routine general medical examination at a health care facility   [facility] [place], Oralia Rud, DO       Future Appointments             Tomorrow Sandi Mealy, MD [facility]   In 2 months Margarita Mail, DO [facility], [facility] [place]

## 2022-07-01 ENCOUNTER — Ambulatory Visit: Payer: 59 | Admitting: Dermatology

## 2022-07-06 ENCOUNTER — Other Ambulatory Visit: Payer: Self-pay | Admitting: Internal Medicine

## 2022-07-06 DIAGNOSIS — E781 Pure hyperglyceridemia: Secondary | ICD-10-CM

## 2022-07-07 NOTE — Telephone Encounter (Unsigned)
Copied from CRM [id number]. Topic: General - Inquiry >> Jul 07, 2022  9:25 AM Haroldine Laws wrote: Reason for CRM: pt needs a referral to someone who cam write her a letter for her to have ESA for her dog.  CB#  [phone]

## 2022-07-14 ENCOUNTER — Telehealth: Payer: Self-pay | Admitting: Internal Medicine

## 2022-07-14 DIAGNOSIS — H43813 Vitreous degeneration, bilateral: Secondary | ICD-10-CM | POA: Diagnosis not present

## 2022-07-14 DIAGNOSIS — H40003 Preglaucoma, unspecified, bilateral: Secondary | ICD-10-CM | POA: Diagnosis not present

## 2022-07-14 DIAGNOSIS — Z961 Presence of intraocular lens: Secondary | ICD-10-CM | POA: Diagnosis not present

## 2022-07-14 DIAGNOSIS — H26492 Other secondary cataract, left eye: Secondary | ICD-10-CM | POA: Diagnosis not present

## 2022-07-14 NOTE — Telephone Encounter (Signed)
Called patient to schedule Medicare Annual Wellness Visit (AWV). Left message for patient to call back and schedule Medicare Annual Wellness Visit (AWV).  Last date of AWV: 04/29/2021  Please schedule an appointment at any time with [facility] .  If any questions, please contact me at [phone].  Thank you ,  Bernice Cicero Care Guide [facility] AWV TEAM Direct Dial: [phone]   

## 2022-07-16 ENCOUNTER — Telehealth: Payer: Self-pay | Admitting: Internal Medicine

## 2022-07-16 NOTE — Telephone Encounter (Signed)
Contacted Vanessa Casey to schedule their annual wellness visit. Appointment made for 07/24/2022.  [facility] Care Guide [facility] AWV TEAM Direct Dial: [phone]

## 2022-07-24 ENCOUNTER — Telehealth: Payer: Self-pay

## 2022-07-24 NOTE — Telephone Encounter (Signed)
07/24/2022 09:16 AM EDT by Sue Lush, LPN  Outgoing Vanessa Casey, Vanessa Casey (Self) [phone] (Home) Remove  Left Message - i called pt three times. Ileft a message to rtn call to CCM to r/s AWV.

## 2022-07-24 NOTE — Progress Notes (Deleted)
I connected with  Vanessa Casey on 07/24/22 by a audio enabled telemedicine application and verified that I am speaking with the correct person using two identifiers.  Patient Location: Home  Provider Location: Office/Clinic  I discussed the limitations of evaluation and management by telemedicine. The patient expressed understanding and agreed to proceed.  Subjective:   Vanessa Casey is a 72 y.o. female who presents for Medicare Annual (Subsequent) preventive examination.  Review of Systems    ***       Objective:    There were no vitals filed for this visit. There is no height or weight on file to calculate BMI.     04/29/2021   11:23 AM 04/27/2020    1:44 PM 04/21/2019    3:45 PM 03/03/2018    7:44 PM 01/14/2018    3:44 PM 06/09/2017   10:33 AM 05/05/2017    7:07 AM  Advanced Directives  Does Patient Have a Medical Advance Directive? No No No No No No No  Would patient like information on creating a medical advance directive?    No - Patient declined Yes (MAU/Ambulatory/Procedural Areas - Information given) No - Patient declined     Current Medications (verified) Outpatient Encounter Medications as of 07/24/2022  Medication Sig   fenofibrate (TRICOR) 48 MG tablet TAKE 1 TABLET(48 MG) BY MOUTH DAILY   hydrochlorothiazide (MICROZIDE) 12.5 MG capsule Take 1 capsule (12.5 mg total) by mouth daily.   ibuprofen (ADVIL) 600 MG tablet Take 1 tablet (600 mg total) by mouth every 8 (eight) hours as needed.   No facility-administered encounter medications on file as of 07/24/2022.    Allergies (verified) Lisinopril and Losartan   History: Past Medical History:  Diagnosis Date   Acute cholecystitis 03/04/2018   Atypical mole 07/30/2017   left breast/excision   Basal cell carcinoma 07/30/2017   right jaw   Basal cell carcinoma 09/16/2017   left upper arm/excision   Basal cell carcinoma 11/22/2018   right upper cutaneous lip   BCC (basal cell carcinoma of skin) 11/13/2021    upper mid forehead (Nodular)  needs referral for MOHS   BCC (basal cell carcinoma of skin) 11/13/2021   left upper forehead at hairline (superficial)  needs treatment schedule MOHS or ED&C   Edema    FEET/ LEGS   Hypertension    Medical history non-contributory    Post-operative nausea and vomiting 04/30/2017   Shingles    Past Surgical History:  Procedure Laterality Date   BASAL CELL CARCINOMA EXCISION     CATARACT EXTRACTION W/PHACO Right 05/05/2017   Procedure: CATARACT EXTRACTION PHACO AND INTRAOCULAR LENS PLACEMENT (IOC);  Surgeon: Galen Manila, MD;  Location: [facility] ORS;  Service: Ophthalmology;  Laterality: Right;  [place] 00:33.2 AP% 15.8 CDE 5.25 Fluid Pack Lot # [id number]   CATARACT EXTRACTION W/PHACO Left 06/09/2017   Procedure: CATARACT EXTRACTION PHACO AND INTRAOCULAR LENS PLACEMENT (IOC);  Surgeon: Galen Manila, MD;  Location: [facility] ORS;  Service: Ophthalmology;  Laterality: Left;  [place] 01:02.2 AP% 15.2 CDE 9.48 Fluid Pack Lot # [id number]   CHOLECYSTECTOMY N/A 03/04/2018   Procedure: LAPAROSCOPIC CHOLECYSTECTOMY;  Surgeon: Leafy Ro, MD;  Location: [facility] ORS;  Service: General;  Laterality: N/A;   TOTAL ABDOMINAL HYSTERECTOMY     Family History  Problem Relation Age of Onset   Dementia Mother    Heart failure Father    Heart attack Sister    Heart attack Brother    Breast cancer Maternal Aunt    Social History  Socioeconomic History   Marital status: Single    Spouse name: Not on file   Number of children: Not on file   Years of education: Not on file   Highest education level: Not on file  Occupational History   Not on file  Tobacco Use   Smoking status: Never   Smokeless tobacco: Never  Vaping Use   Vaping Use: Never used  Substance and Sexual Activity   Alcohol use: No   Drug use: No   Sexual activity: Not Currently    Birth control/protection: Post-menopausal  Other Topics Concern   Not on file  Social History Narrative   Not on file    Social Determinants of Health   Financial Resource Strain: Low Risk  (04/29/2021)   Overall Financial Resource Strain (CARDIA)    Difficulty of Paying Living Expenses: Not hard at all  Food Insecurity: No Food Insecurity (04/29/2021)   Hunger Vital Sign    Worried About Running Out of Food in the Last Year: Never true    Ran Out of Food in the Last Year: Never true  Transportation Needs: No Transportation Needs (04/29/2021)   PRAPARE - Administrator, Civil Service (Medical): No    Lack of Transportation (Non-Medical): No  Physical Activity: Insufficiently Active (04/29/2021)   Exercise Vital Sign    Days of Exercise per Week: 3 days    Minutes of Exercise per Session: 30 min  Stress: No Stress Concern Present (04/29/2021)   [employer] of Occupational Health - Occupational Stress Questionnaire    Feeling of Stress : Not at all  Social Connections: Unknown (04/29/2021)   Social Connection and Isolation Panel [NHANES]    Frequency of Communication with Friends and Family: More than three times a week    Frequency of Social Gatherings with Friends and Family: More than three times a week    Attends Religious Services: Never    Database administrator or Organizations: No    Attends Banker Meetings: Never    Marital Status: Not on file    Tobacco Counseling Counseling given: Not Answered   Clinical Intake:                 Diabetic?***         Activities of Daily Living    06/10/2022   10:12 AM 05/05/2022    1:42 PM  In your present state of health, do you have any difficulty performing the following activities:  Hearing? 0 0  Vision? 0 0  Difficulty concentrating or making decisions? 0 0  Walking or climbing stairs? 0 0  Dressing or bathing? 0 0  Doing errands, shopping? 0 0    Patient Care Team: Margarita Mail, DO as PCP - General (Internal Medicine) Isla Pence, OD (Optometry)  Indicate any recent Medical Services  you may have received from other than [facility] providers in the past year (date may be approximate).     Assessment:   This is a routine wellness examination for Vanessa Casey.  Hearing/Vision screen No results found.  Dietary issues and exercise activities discussed:     Goals Addressed   None    Depression Screen    06/10/2022   10:12 AM 05/05/2022    1:42 PM 02/03/2022    8:52 AM 12/31/2021   10:52 AM 12/03/2021   10:15 AM 05/28/2021    4:00 PM 04/29/2021   11:36 AM  PHQ 2/9 Scores  PHQ - 2 Score 0  0 0 0 0 0 0  PHQ- 9 Score 0 0 0 0 0 0     Fall Risk    06/10/2022   10:10 AM 05/05/2022    1:42 PM 02/03/2022    8:52 AM 12/03/2021   10:16 AM 04/29/2021   11:23 AM  Fall Risk   Falls in the past year? 0 0 0 0 0  Number falls in past yr: 0 0 0 0 0  Injury with Fall? 0 0 0 0 0  Risk for fall due to :   No Fall Risks No Fall Risks   Follow up   Falls evaluation completed Falls evaluation completed Falls evaluation completed;Falls prevention discussed    FALL RISK PREVENTION PERTAINING TO THE HOME:  Any stairs in or around the home? {YES/NO:21197} If so, are there any without handrails? {YES/NO:21197} Home free of loose throw rugs in walkways, pet beds, electrical cords, etc? {YES/NO:21197} Adequate lighting in your home to reduce risk of falls? {YES/NO:21197}  ASSISTIVE DEVICES UTILIZED TO PREVENT FALLS:  Life alert? {YES/NO:21197} Use of a cane, walker or w/c? {YES/NO:21197} Grab bars in the bathroom? {YES/NO:21197} Shower chair or bench in shower? {YES/NO:21197} Elevated toilet seat or a handicapped toilet? {YES/NO:21197}  TIMED UP AND GO:  Was the test performed? {YES/NO:21197}.  Length of time to ambulate 10 feet: *** sec.   {Appearance of WUJW:1191478}  Cognitive Function:        04/27/2020    1:46 PM 03/17/2019    3:32 PM 01/14/2018    3:45 PM 01/12/2017    3:23 PM  6CIT Screen  What Year? 0 points 0 points 0 points 0 points  What month? 0 points 0 points 0 points 0  points  What time? 0 points 0 points 0 points 0 points  Count back from 20 0 points 2 points 0 points 0 points  Months in reverse 0 points 0 points 0 points 0 points  Repeat phrase 0 points 2 points 0 points 2 points  Total Score 0 points 4 points 0 points 2 points    Immunizations Immunization History  Administered Date(s) Administered   Fluad Quad(high Dose 65+) 12/03/2018, 12/03/2021   Influenza, High Dose Seasonal PF 12/16/2017   Influenza,inj,Quad PF,6+ Mos 12/11/2016   Influenza-Unspecified 12/18/2020   PFIZER(Purple Top)SARS-COV-2 Vaccination 05/12/2019, 06/02/2019   Pneumococcal Conjugate-13 01/12/2017   Pneumococcal Polysaccharide-23 01/14/2018   Td 11/08/2014    {TDAP status:2101805}  {Flu Vaccine status:2101806}  {Pneumococcal vaccine status:2101807}  {Covid-19 vaccine status:2101808}  Qualifies for Shingles Vaccine? {YES/NO:21197}  Zostavax completed {YES/NO:21197}  {Shingrix Completed?:2101804}  Screening Tests Health Maintenance  Topic Date Due   COVID-19 Vaccine (3 - Pfizer risk series) 06/30/2019   Fecal DNA (Cologuard)  01/14/2020   Medicare Annual Wellness (AWV)  04/29/2022   Zoster Vaccines- Shingrix (1 of 2) 08/03/2022 (Originally 01/01/1970)   INFLUENZA VACCINE  10/09/2022   MAMMOGRAM  12/26/2023   DTaP/Tdap/Td (2 - Tdap) 11/07/2024   Pneumonia Vaccine 7+ Years old  Completed   DEXA SCAN  Completed   Hepatitis C Screening  Completed   HPV VACCINES  Aged Out    Health Maintenance  Health Maintenance Due  Topic Date Due   COVID-19 Vaccine (3 - Pfizer risk series) 06/30/2019   Fecal DNA (Cologuard)  01/14/2020   Medicare Annual Wellness (AWV)  04/29/2022    {Colorectal cancer screening:2101809}  {Mammogram status:21018020}  {Bone Density status:21018021}  Lung Cancer Screening: (Low Dose CT Chest recommended if Age 66-80 years, 30 pack-year  currently smoking OR have quit w/in 15years.) {DOES NOT does:27190::"does not"} qualify.    Lung Cancer Screening Referral: ***  Additional Screening:  Hepatitis C Screening: {DOES NOT does:27190::"does not"} qualify; Completed ***  Vision Screening: Recommended annual ophthalmology exams for early detection of glaucoma and other disorders of the eye. Is the patient up to date with their annual eye exam?  {YES/NO:21197} Who is the provider or what is the name of the office in which the patient attends annual eye exams? *** If pt is not established with a provider, would they like to be referred to a provider to establish care? {YES/NO:21197}.   Dental Screening: Recommended annual dental exams for proper oral hygiene  Community Resource Referral / Chronic Care Management: CRR required this visit?  {YES/NO:21197}  CCM required this visit?  {YES/NO:21197}     Plan:     I have personally reviewed and noted the following in the patient's chart:   Medical and social history Use of alcohol, tobacco or illicit drugs  Current medications and supplements including opioid prescriptions. {Opioid [phone]} Functional ability and status Nutritional status Physical activity Advanced directives List of other physicians Hospitalizations, surgeries, and ER visits in previous 12 months Vitals Screenings to include cognitive, depression, and falls Referrals and appointments  In addition, I have reviewed and discussed with patient certain preventive protocols, quality metrics, and best practice recommendations. A written personalized care plan for preventive services as well as general preventive health recommendations were provided to patient.     Sue Lush, LPN   1/61/0960   Nurse Notes: ***

## 2022-08-11 ENCOUNTER — Telehealth: Payer: Self-pay | Admitting: Dermatology

## 2022-08-11 NOTE — Telephone Encounter (Signed)
Thank you :)

## 2022-08-11 NOTE — Telephone Encounter (Signed)
Called and discussed need for treatment of bcc at forehead. Patient prefers ED&C. Patient was already scheduled for follow up with Dr. Roseanne Reno on July 16 at 12 pm to look at a few spots. Patient would like do procedure at appointment already scheduled.       Patient has 2 untreated BCCs at her forehead that she did not come back to have treated. Please review that these grow deeper and wider and can grow into the skull and rarely become life threatening (cause death) if they are not treated.   Recommend Mohs surgery or ED&C. There are cream options that can sometimes work but have a lower cure rate. Radiation is also an option but it requires many visits.   If she prefers [facility], I can add her to clinic tomorrow afternoon or she can schedule with Dr. Katrinka Blazing for August. Mohs has the higher cure rate and they could do that at the [facility] or [place] or [facility].   If she declines any treatment, please document that the above was reviewed including risk of invasion into skull and death and that she declines treatment. If we cannot reach her, we need to send a certified letter. Thank you!   1. Skin , upper mid forehead BASAL CELL CARCINOMA, NODULAR PATTERN --> Mohs surgery   2. Skin , left upper forehead at hair line SUPERFICIAL BASAL CELL CARCINOMA  --> Mohs recommended, could consider scrape and burn in office if preferred since this is more superficial, but Mohs still has a higher cure rate and less likely it comes back   [facility] has about an 85% cure rate and leaves a round wound the size of the skin cancer which is healed with ointment and a bandage over a few weeks time. It leaves a round white scar. No additional pathology is done. If the skin cancer were to come back, we would need to do a surgery to remove it.   Mohs involves cutting out right around the spot and then checking under the microscope to be sure the whole skin cancer is out. The cure rate is about 98-99%. It is done at another  office outside of [place] ([place], [place], or [place]). Once the Mohs surgeon confirms the skin cancer is out, they will discuss the options to repair or heal the area. You must take it easy for about two weeks after surgery (no lifting over 10-15 lbs, avoid activity to get your heart rate and blood pressure up).

## 2022-08-11 NOTE — Telephone Encounter (Signed)
Patient has 2 untreated BCCs at her forehead that she did not come back to have treated. Please review that these grow deeper and wider and can grow into the skull and rarely become life threatening (cause death) if they are not treated.   Recommend Mohs surgery or ED&C. There are cream options that can sometimes work but have a lower cure rate. Radiation is also an option but it requires many visits.   If she prefers [facility], I can add her to clinic tomorrow afternoon or she can schedule with Dr. Katrinka Blazing for August. Mohs has the higher cure rate and they could do that at the [facility] or [place] or [facility]   If she declines any treatment, please document that the above was reviewed including risk of invasion into skull and death and that she declines treatment. If we cannot reach her, we need to send a certified letter. Thank you!   1. Skin , upper mid forehead BASAL CELL CARCINOMA, NODULAR PATTERN --> Mohs surgery   2. Skin , left upper forehead at hair line SUPERFICIAL BASAL CELL CARCINOMA  --> Mohs recommended, could consider scrape and burn in office if preferred since this is more superficial, but Mohs still has a higher cure rate and less likely it comes back   [facility] has about an 85% cure rate and leaves a round wound the size of the skin cancer which is healed with ointment and a bandage over a few weeks time. It leaves a round white scar. No additional pathology is done. If the skin cancer were to come back, we would need to do a surgery to remove it.   Mohs involves cutting out right around the spot and then checking under the microscope to be sure the whole skin cancer is out. The cure rate is about 98-99%. It is done at another office outside of [place] ([place], [place], or [place]). Once the Mohs surgeon confirms the skin cancer is out, they will discuss the options to repair or heal the area. You must take it easy for about two weeks after surgery (no lifting over 10-15 lbs,  avoid activity to get your heart rate and blood pressure up).

## 2022-08-22 ENCOUNTER — Telehealth: Payer: Self-pay | Admitting: Dermatology

## 2022-08-22 NOTE — Telephone Encounter (Signed)
Please send a certified letter. Thank you!     Dear [name],  I hope this letter finds you well.  It has been my sincere pleasure to be a part of your healthcare team. At the time of my leaving, our records show you have two basal cell skin cancers at the forehead which have not been treated yet.  Since I will be unable to follow-up with you in future, I am writing to stress the importance of keeping your skin cancer treatment appointment. When treated, skin cancer typically has very good outcomes.   However, if skin cancer is left untreated it can pose serious health risks including destroying nearby tissue or even death.   If for any reason you are unable to go to your appointment or if you do not have your appointment scheduled, please reach out to our office at [phone] option 4 or through MyChart so that we can help you schedule or reschedule as needed.  If you have any concerns about your treatment and wish to discuss other treatment options, please reach out to our office as well. You can reach [place] by phone at [phone] option 4 or through [employer].   As I am transitioning from the practice, I regret that I will not be able to continue with your care personally. However, I am confident you are in capable hands with my colleagues and the team at [facility].   Thank you for entrusting me with your skin health. 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 Please do not hesitate to reach out if you have any questions or concerns.  Wishing you continued health and well-being.  Sincerely,  Vanessa Mealy, MD, MPH Dermatologist [facility] [phone] option 4

## 2022-08-25 ENCOUNTER — Other Ambulatory Visit: Payer: Self-pay | Admitting: Family Medicine

## 2022-08-25 ENCOUNTER — Encounter: Payer: Self-pay | Admitting: Dermatology

## 2022-08-29 ENCOUNTER — Ambulatory Visit (INDEPENDENT_AMBULATORY_CARE_PROVIDER_SITE_OTHER): Payer: 59

## 2022-08-29 VITALS — Ht 60.0 in | Wt 164.0 lb

## 2022-08-29 DIAGNOSIS — Z0000 Encounter for general adult medical examination without abnormal findings: Secondary | ICD-10-CM

## 2022-08-29 NOTE — Patient Instructions (Signed)
Vanessa Casey , Thank you for taking time to come for your Medicare Wellness Visit. I appreciate your ongoing commitment to your health goals. Please review the following plan we discussed and let me know if I can assist you in the future.   These are the goals we discussed:  Goals      Patient Stated     04/27/2020, stay healthy     Patient Stated     Stay healthy        This is a list of the screening recommended for you and due dates:  Health Maintenance  Topic Date Due   Zoster (Shingles) Vaccine (1 of 2) Never done   COVID-19 Vaccine (3 - Pfizer risk series) 06/30/2019   Cologuard (Stool DNA test)  01/14/2020   Flu Shot  10/09/2022   Medicare Annual Wellness Visit  08/29/2023   Mammogram  12/26/2023   DTaP/Tdap/Td vaccine (2 - Tdap) 11/07/2024   Pneumonia Vaccine  Completed   DEXA scan (bone density measurement)  Completed   Hepatitis C Screening  Completed   HPV Vaccine  Aged Out    Advanced directives: no  Conditions/risks identified: low falls risk  Next appointment: Follow up in one year for your annual wellness visit 0/26/2025 @ 9:45am telephone   Preventive Care 65 Years and Older, Female Preventive care refers to lifestyle choices and visits with your health care provider that can promote health and wellness. What does preventive care include? A yearly physical exam. This is also called an annual well check. Dental exams once or twice a year. Routine eye exams. Ask your health care provider how often you should have your eyes checked. Personal lifestyle choices, including: Daily care of your teeth and gums. Regular physical activity. Eating a healthy diet. Avoiding tobacco and drug use. Limiting alcohol use. Practicing safe sex. Taking low-dose aspirin every day. Taking vitamin and mineral supplements as recommended by your health care provider. What happens during an annual well check? The services and screenings done by your health care provider during  your annual well check will depend on your age, overall health, lifestyle risk factors, and family history of disease. Counseling  Your health care provider may ask you questions about your: Alcohol use. Tobacco use. Drug use. Emotional well-being. Home and relationship well-being. Sexual activity. Eating habits. History of falls. Memory and ability to understand (cognition). Work and work Astronomer. Reproductive health. Screening  You may have the following tests or measurements: Height, weight, and BMI. Blood pressure. Lipid and cholesterol levels. These may be checked every 5 years, or more frequently if you are over 64 years old. Skin check. Lung cancer screening. You may have this screening every year starting at age 34 if you have a 30-pack-year history of smoking and currently smoke or have quit within the past 15 years. Fecal occult blood test (FOBT) of the stool. You may have this test every year starting at age 35. Flexible sigmoidoscopy or colonoscopy. You may have a sigmoidoscopy every 5 years or a colonoscopy every 10 years starting at age 3. Hepatitis C blood test. Hepatitis B blood test. Sexually transmitted disease (STD) testing. Diabetes screening. This is done by checking your blood sugar (glucose) after you have not eaten for a while (fasting). You may have this done every 1-3 years. Bone density scan. This is done to screen for osteoporosis. You may have this done starting at age 63. Mammogram. This may be done every 1-2 years. Talk to your health care  provider about how often you should have regular mammograms. Talk with your health care provider about your test results, treatment options, and if necessary, the need for more tests. Vaccines  Your health care provider may recommend certain vaccines, such as: Influenza vaccine. This is recommended every year. Tetanus, diphtheria, and acellular pertussis (Tdap, Td) vaccine. You may need a Td booster every 10  years. Zoster vaccine. You may need this after age 74. Pneumococcal 13-valent conjugate (PCV13) vaccine. One dose is recommended after age 67. Pneumococcal polysaccharide (PPSV23) vaccine. One dose is recommended after age 73. Talk to your health care provider about which screenings and vaccines you need and how often you need them. This information is not intended to replace advice given to you by your health care provider. Make sure you discuss any questions you have with your health care provider. Document Released: 03/23/2015 Document Revised: 11/14/2015 Document Reviewed: 12/26/2014 Elsevier Interactive Patient Education  2017 [employer].  Fall Prevention in the Home Falls can cause injuries. They can happen to people of all ages. There are many things you can do to make your home safe and to help prevent falls. What can I do on the outside of my home? Regularly fix the edges of walkways and driveways and fix any cracks. Remove anything that might make you trip as you walk through a door, such as a raised step or threshold. Trim any bushes or trees on the path to your home. Use bright outdoor lighting. Clear any walking paths of anything that might make someone trip, such as rocks or tools. Regularly check to see if handrails are loose or broken. Make sure that both sides of any steps have handrails. Any raised decks and porches should have guardrails on the edges. Have any leaves, snow, or ice cleared regularly. Use sand or salt on walking paths during winter. Clean up any spills in your garage right away. This includes oil or grease spills. What can I do in the bathroom? Use night lights. Install grab bars by the toilet and in the tub and shower. Do not use towel bars as grab bars. Use non-skid mats or decals in the tub or shower. If you need to sit down in the shower, use a plastic, non-slip stool. Keep the floor dry. Clean up any water that spills on the floor as soon as it  happens. Remove soap buildup in the tub or shower regularly. Attach bath mats securely with double-sided non-slip rug tape. Do not have throw rugs and other things on the floor that can make you trip. What can I do in the bedroom? Use night lights. Make sure that you have a light by your bed that is easy to reach. Do not use any sheets or blankets that are too big for your bed. They should not hang down onto the floor. Have a firm chair that has side arms. You can use this for support while you get dressed. Do not have throw rugs and other things on the floor that can make you trip. What can I do in the kitchen? Clean up any spills right away. Avoid walking on wet floors. Keep items that you use a lot in easy-to-reach places. If you need to reach something above you, use a strong step stool that has a grab bar. Keep electrical cords out of the way. Do not use floor polish or wax that makes floors slippery. If you must use wax, use non-skid floor wax. Do not have throw rugs  and other things on the floor that can make you trip. What can I do with my stairs? Do not leave any items on the stairs. Make sure that there are handrails on both sides of the stairs and use them. Fix handrails that are broken or loose. Make sure that handrails are as long as the stairways. Check any carpeting to make sure that it is firmly attached to the stairs. Fix any carpet that is loose or worn. Avoid having throw rugs at the top or bottom of the stairs. If you do have throw rugs, attach them to the floor with carpet tape. Make sure that you have a light switch at the top of the stairs and the bottom of the stairs. If you do not have them, ask someone to add them for you. What else can I do to help prevent falls? Wear shoes that: Do not have high heels. Have rubber bottoms. Are comfortable and fit you well. Are closed at the toe. Do not wear sandals. If you use a stepladder: Make sure that it is fully opened.  Do not climb a closed stepladder. Make sure that both sides of the stepladder are locked into place. Ask someone to hold it for you, if possible. Clearly mark and make sure that you can see: Any grab bars or handrails. First and last steps. Where the edge of each step is. Use tools that help you move around (mobility aids) if they are needed. These include: Canes. Walkers. Scooters. Crutches. Turn on the lights when you go into a dark area. Replace any light bulbs as soon as they burn out. Set up your furniture so you have a clear path. Avoid moving your furniture around. If any of your floors are uneven, fix them. If there are any pets around you, be aware of where they are. Review your medicines with your doctor. Some medicines can make you feel dizzy. This can increase your chance of falling. Ask your doctor what other things that you can do to help prevent falls. This information is not intended to replace advice given to you by your health care provider. Make sure you discuss any questions you have with your health care provider. Document Released: 12/21/2008 Document Revised: 08/02/2015 Document Reviewed: 03/31/2014 Elsevier Interactive Patient Education  2017 [employer].

## 2022-08-29 NOTE — Progress Notes (Signed)
Subjective:   Vanessa Casey is a 72 y.o. female who presents for Medicare Annual (Subsequent) preventive examination.  Visit Complete: Virtual  I connected with  Vanessa Casey on 08/29/22 by a audio enabled telemedicine application and verified that I am speaking with the correct person using two identifiers.  Patient Location: Home  Provider Location: Office/Clinic  I discussed the limitations of evaluation and management by telemedicine. The patient expressed understanding and agreed to proceed.  Patient Medicare AWV questionnaire was completed by the patient on (not done); I have confirmed that all information answered by patient is correct and no changes since this date.  Review of Systems    Cardiac Risk Factors include: advanced age (>55men, >70 women);dyslipidemia;hypertension;obesity (BMI >30kg/m2)    Objective:    Today's Vitals   08/29/22 0955  Weight: 164 lb (74.4 kg)  Height: 5' (1.524 m)   Body mass index is 32.03 kg/m.     08/29/2022   10:06 AM 04/29/2021   11:23 AM 04/27/2020    1:44 PM 04/21/2019    3:45 PM 03/03/2018    7:44 PM 01/14/2018    3:44 PM 06/09/2017   10:33 AM  Advanced Directives  Does Patient Have a Medical Advance Directive? No No No No No No No  Would patient like information on creating a medical advance directive?     No - Patient declined Yes (MAU/Ambulatory/Procedural Areas - Information given) No - Patient declined    Current Medications (verified) Outpatient Encounter Medications as of 08/29/2022  Medication Sig   fenofibrate (TRICOR) 48 MG tablet TAKE 1 TABLET(48 MG) BY MOUTH DAILY   hydrochlorothiazide (MICROZIDE) 12.5 MG capsule Take 1 capsule (12.5 mg total) by mouth daily.   ibuprofen (ADVIL) 600 MG tablet Take 1 tablet (600 mg total) by mouth every 8 (eight) hours as needed.   No facility-administered encounter medications on file as of 08/29/2022.    Allergies (verified) Lisinopril and Losartan   History: Past Medical  History:  Diagnosis Date   Acute cholecystitis 03/04/2018   Atypical mole 07/30/2017   left breast/excision   Basal cell carcinoma 07/30/2017   right jaw   Basal cell carcinoma 09/16/2017   left upper arm/excision   Basal cell carcinoma 11/22/2018   right upper cutaneous lip   BCC (basal cell carcinoma of skin) 11/13/2021   upper mid forehead (Nodular)  needs referral for MOHS   BCC (basal cell carcinoma of skin) 11/13/2021   left upper forehead at hairline (superficial)  needs treatment schedule MOHS or ED&C   Edema    FEET/ LEGS   Hypertension    Medical history non-contributory    Post-operative nausea and vomiting 04/30/2017   Shingles    Past Surgical History:  Procedure Laterality Date   BASAL CELL CARCINOMA EXCISION     CATARACT EXTRACTION W/PHACO Right 05/05/2017   Procedure: CATARACT EXTRACTION PHACO AND INTRAOCULAR LENS PLACEMENT (IOC);  Surgeon: Galen Manila, MD;  Location: [facility] ORS;  Service: Ophthalmology;  Laterality: Right;  [place] 00:33.2 AP% 15.8 CDE 5.25 Fluid Pack Lot # [id number]   CATARACT EXTRACTION W/PHACO Left 06/09/2017   Procedure: CATARACT EXTRACTION PHACO AND INTRAOCULAR LENS PLACEMENT (IOC);  Surgeon: Galen Manila, MD;  Location: [facility] ORS;  Service: Ophthalmology;  Laterality: Left;  [place] 01:02.2 AP% 15.2 CDE 9.48 Fluid Pack Lot # [id number] H   CHOLECYSTECTOMY N/A 03/04/2018   Procedure: LAPAROSCOPIC CHOLECYSTECTOMY;  Surgeon: Leafy Ro, MD;  Location: [facility] ORS;  Service: General;  Laterality: N/A;   TOTAL ABDOMINAL  HYSTERECTOMY     Family History  Problem Relation Age of Onset   Dementia Mother    Heart failure Father    Heart attack Sister    Heart attack Brother    Breast cancer Maternal Aunt    Social History   Socioeconomic History   Marital status: Single    Spouse name: Not on file   Number of children: Not on file   Years of education: Not on file   Highest education level: Not on file  Occupational History   Not on file   Tobacco Use   Smoking status: Never   Smokeless tobacco: Never  Vaping Use   Vaping Use: Never used  Substance and Sexual Activity   Alcohol use: No   Drug use: No   Sexual activity: Not Currently    Birth control/protection: Post-menopausal  Other Topics Concern   Not on file  Social History Narrative   Not on file   Social Determinants of Health   Financial Resource Strain: Low Risk  (08/29/2022)   Overall Financial Resource Strain (CARDIA)    Difficulty of Paying Living Expenses: Not hard at all  Food Insecurity: No Food Insecurity (08/29/2022)   Hunger Vital Sign    Worried About Running Out of Food in the Last Year: Never true    Ran Out of Food in the Last Year: Never true  Transportation Needs: No Transportation Needs (08/29/2022)   PRAPARE - Administrator, Civil Service (Medical): No    Lack of Transportation (Non-Medical): No  Physical Activity: Insufficiently Active (08/29/2022)   Exercise Vital Sign    Days of Exercise per Week: 4 days    Minutes of Exercise per Session: 30 min  Stress: No Stress Concern Present (08/29/2022)   [employer] of Occupational Health - Occupational Stress Questionnaire    Feeling of Stress : Not at all  Social Connections: Socially Isolated (08/29/2022)   Social Connection and Isolation Panel [NHANES]    Frequency of Communication with Friends and Family: More than three times a week    Frequency of Social Gatherings with Friends and Family: More than three times a week    Attends Religious Services: Never    Database administrator or Organizations: No    Attends Banker Meetings: Never    Marital Status: Widowed    Tobacco Counseling Counseling given: Not Answered   Clinical Intake:  Pre-visit preparation completed: Yes  Pain : No/denies pain   BMI - recorded: 32.03 Nutritional Status: BMI > 30  Obese Nutritional Risks: None Diabetes: No  How often do you need to have someone help you when  you read instructions, pamphlets, or other written materials from your doctor or pharmacy?: 1 - Never  Interpreter Needed?: No  Comments: lives alone Information entered by :: B.Avey Mcmanamon,LPN   Activities of Daily Living    08/29/2022   10:06 AM 06/10/2022   10:12 AM  In your present state of health, do you have any difficulty performing the following activities:  Hearing? 0 0  Vision? 0 0  Difficulty concentrating or making decisions? 0 0  Walking or climbing stairs? 0 0  Dressing or bathing? 0 0  Doing errands, shopping? 0 0  Preparing Food and eating ? N   Using the Toilet? N   In the past six months, have you accidently leaked urine? N   Do you have problems with loss of bowel control? N   Managing your Medications?  N   Managing your Finances? N   Housekeeping or managing your Housekeeping? N     Patient Care Team: Margarita Mail, DO as PCP - General (Internal Medicine) Isla Pence, OD (Optometry)  Indicate any recent Medical Services you may have received from other than [facility] providers in the past year (date may be approximate).     Assessment:   This is a routine wellness examination for Vanessa Casey.  Hearing/Vision screen Hearing Screening - Comments:: Adequate hearing Vision Screening - Comments:: Adequate vision;reading only [facility] -Dr Lillie Fragmin  Dietary issues and exercise activities discussed:     Goals Addressed             This Visit's Progress    Patient Stated   On track    04/27/2020, stay healthy       Depression Screen    08/29/2022   10:03 AM 06/10/2022   10:12 AM 05/05/2022    1:42 PM 02/03/2022    8:52 AM 12/31/2021   10:52 AM 12/03/2021   10:15 AM 05/28/2021    4:00 PM  PHQ 2/9 Scores  PHQ - 2 Score 0 0 0 0 0 0 0  PHQ- 9 Score  0 0 0 0 0 0    Fall Risk    08/29/2022    9:57 AM 06/10/2022   10:10 AM 05/05/2022    1:42 PM 02/03/2022    8:52 AM 12/03/2021   10:16 AM  Fall Risk   Falls in the past year? 0 0 0 0 0  Number  falls in past yr: 0 0 0 0 0  Injury with Fall? 0 0 0 0 0  Risk for fall due to : No Fall Risks   No Fall Risks No Fall Risks  Follow up Falls prevention discussed;Education provided   Falls evaluation completed Falls evaluation completed    MEDICARE RISK AT HOME:  Medicare Risk at Home - 08/29/22 0958     Any stairs in or around the home? No    If so, are there any without handrails? No    Home free of loose throw rugs in walkways, pet beds, electrical cords, etc? Yes    Adequate lighting in your home to reduce risk of falls? Yes    Life alert? No    Use of a cane, walker or w/c? No    Grab bars in the bathroom? No    Shower chair or bench in shower? No    Elevated toilet seat or a handicapped toilet? No             TIMED UP AND GO:  Was the test performed?  No    Cognitive Function:        08/29/2022   10:07 AM 04/27/2020    1:46 PM 03/17/2019    3:32 PM 01/14/2018    3:45 PM 01/12/2017    3:23 PM  6CIT Screen  What Year? 0 points 0 points 0 points 0 points 0 points  What month? 0 points 0 points 0 points 0 points 0 points  What time? 0 points 0 points 0 points 0 points 0 points  Count back from 20 0 points 0 points 2 points 0 points 0 points  Months in reverse 0 points 0 points 0 points 0 points 0 points  Repeat phrase 0 points 0 points 2 points 0 points 2 points  Total Score 0 points 0 points 4 points 0 points 2 points    Immunizations Immunization  History  Administered Date(s) Administered   Fluad Quad(high Dose 65+) 12/03/2018, 12/03/2021   Influenza, High Dose Seasonal PF 12/16/2017   Influenza,inj,Quad PF,6+ Mos 12/11/2016   Influenza-Unspecified 12/18/2020   PFIZER(Purple Top)SARS-COV-2 Vaccination 05/12/2019, 06/02/2019   Pneumococcal Conjugate-13 01/12/2017   Pneumococcal Polysaccharide-23 01/14/2018   Td 11/08/2014    TDAP status: Up to date  Flu Vaccine status: Up to date  Pneumococcal vaccine status: Completed during today's visit.  Covid-19  vaccine status: Completed vaccines  Qualifies for Shingles Vaccine? Yes   Zostavax completed No   Shingrix Completed?: No.    Education has been provided regarding the importance of this vaccine. Patient has been advised to call insurance company to determine out of pocket expense if they have not yet received this vaccine. Advised may also receive vaccine at local pharmacy or Health Dept. Verbalized acceptance and understanding.  Screening Tests Health Maintenance  Topic Date Due   Zoster Vaccines- Shingrix (1 of 2) Never done   COVID-19 Vaccine (3 - Pfizer risk series) 06/30/2019   Fecal DNA (Cologuard)  01/14/2020   INFLUENZA VACCINE  10/09/2022   Medicare Annual Wellness (AWV)  08/29/2023   MAMMOGRAM  12/26/2023   DTaP/Tdap/Td (2 - Tdap) 11/07/2024   Pneumonia Vaccine 64+ Years old  Completed   DEXA SCAN  Completed   Hepatitis C Screening  Completed   HPV VACCINES  Aged Out    Health Maintenance  Health Maintenance Due  Topic Date Due   Zoster Vaccines- Shingrix (1 of 2) Never done   COVID-19 Vaccine (3 - Pfizer risk series) 06/30/2019   Fecal DNA (Cologuard)  01/14/2020    Colorectal cancer screening: Type of screening: Cologuard. Completed yes. Repeat every 3 yearsrecently done/sent back to lab  Mammogram status: Completed yes. Repeat every year  Bone Density status: Completed yes. Results reflect: Bone density results: NORMAL. Repeat every 5 years.  Lung Cancer Screening: (Low Dose CT Chest recommended if Age 71-80 years, 20 pack-year currently smoking OR have quit w/in 15years.) does not qualify.   Lung Cancer Screening Referral: no  Additional Screening:  Hepatitis C Screening: does not qualify; Completed yes  Vision Screening: Recommended annual ophthalmology exams for early detection of glaucoma and other disorders of the eye. Is the patient up to date with their annual eye exam?  Yes  Who is the provider or what is the name of the office in which the patient  attends annual eye exams? Dr Azucena Cecil  If pt is not established with a provider, would they like to be referred to a provider to establish care? No .   Dental Screening: Recommended annual dental exams for proper oral hygiene  Diabetic Foot Exam: n/a  Community Resource Referral / Chronic Care Management: CRR required this visit?  No   CCM required this visit?  No     Plan:     I have personally reviewed and noted the following in the patient's chart:   Medical and social history Use of alcohol, tobacco or illicit drugs  Current medications and supplements including opioid prescriptions. Patient is not currently taking opioid prescriptions. Functional ability and status Nutritional status Physical activity Advanced directives List of other physicians Hospitalizations, surgeries, and ER visits in previous 12 months Vitals Screenings to include cognitive, depression, and falls Referrals and appointments  In addition, I have reviewed and discussed with patient certain preventive protocols, quality metrics, and best practice recommendations. A written personalized care plan for preventive services as well as general preventive health  recommendations were provided to patient.     Sue Lush, LPN   10/02/3662   After Visit Summary: (MyChart) Due to this being a telephonic visit, the after visit summary with patients personalized plan was offered to patient via MyChart   Nurse Notes: The patient states she is doing well and has no concerns or questions at this time.

## 2022-09-07 ENCOUNTER — Other Ambulatory Visit: Payer: Self-pay | Admitting: Internal Medicine

## 2022-09-07 DIAGNOSIS — I10 Essential (primary) hypertension: Secondary | ICD-10-CM

## 2022-09-08 NOTE — Progress Notes (Unsigned)
Established Patient Office Visit  Subjective    Patient ID: Vanessa Casey, female    DOB: Jul 21, 1950  Age: 72 y.o. MRN: [id number]  CC:  No chief complaint on file.   HPI [name] presents to follow up on chronic medical conditions.  Hypertension: -Medications: hydrochlorothiazide decreased to 12.5 mg at LOV -Checking BP at home (average): 120-130/70-80 -Denies any SOB, CP, vision changes, LE edema or symptoms of hypotension  History of multiple basal cell carcinomas removed: -Last of which was in September 2023 on her face and forehead -Following with dermatology on a yearly basis. -Does have a lesion on left eyelid that occasionally bleeds  Hypertriglyceridemia: -Started on tricor 48 mg at LOV -Patient is compliant with above medications and reports no side effects.  -Last lipid panel Lipid Panel     Component Value Date/Time   CHOL 197 12/03/2021 1046   TRIG 363 (H) 12/03/2021 1046   HDL 41 12/03/2021 1046   LDLCALC 95 12/03/2021 1046   LABVLDL 61 (H) 12/03/2021 1046   The ASCVD Risk score (Arnett DK, et al., 2019) failed to calculate for the following reasons:   The systolic blood pressure is missing   Health Maintenance: -Blood work UTD -Mammogram 10/23 Birads-1 -Colon cancer screening due - cologuard ordered in September, will send in sample -Prevnar 20 vaccine due, will obtain at follow-up  Outpatient Encounter Medications as of 09/09/2022  Medication Sig   fenofibrate (TRICOR) 48 MG tablet TAKE 1 TABLET(48 MG) BY MOUTH DAILY   hydrochlorothiazide (MICROZIDE) 12.5 MG capsule Take 1 capsule (12.5 mg total) by mouth daily.   ibuprofen (ADVIL) 600 MG tablet Take 1 tablet (600 mg total) by mouth every 8 (eight) hours as needed.   No facility-administered encounter medications on file as of 09/09/2022.    Past Medical History:  Diagnosis Date   Acute cholecystitis 03/04/2018   Atypical mole 07/30/2017   left breast/excision   Basal cell carcinoma  07/30/2017   right jaw   Basal cell carcinoma 09/16/2017   left upper arm/excision   Basal cell carcinoma 11/22/2018   right upper cutaneous lip   BCC (basal cell carcinoma of skin) 11/13/2021   upper mid forehead (Nodular)  needs referral for MOHS   BCC (basal cell carcinoma of skin) 11/13/2021   left upper forehead at hairline (superficial)  needs treatment schedule MOHS or ED&C   Edema    FEET/ LEGS   Hypertension    Medical history non-contributory    Post-operative nausea and vomiting 04/30/2017   Shingles     Past Surgical History:  Procedure Laterality Date   BASAL CELL CARCINOMA EXCISION     CATARACT EXTRACTION W/PHACO Right 05/05/2017   Procedure: CATARACT EXTRACTION PHACO AND INTRAOCULAR LENS PLACEMENT (IOC);  Surgeon: Galen Manila, MD;  Location: [facility] ORS;  Service: Ophthalmology;  Laterality: Right;  [place] 00:33.2 AP% 15.8 CDE 5.25 Fluid Pack Lot # [id number]   CATARACT EXTRACTION W/PHACO Left 06/09/2017   Procedure: CATARACT EXTRACTION PHACO AND INTRAOCULAR LENS PLACEMENT (IOC);  Surgeon: Galen Manila, MD;  Location: [facility] ORS;  Service: Ophthalmology;  Laterality: Left;  [place] 01:02.2 AP% 15.2 CDE 9.48 Fluid Pack Lot # [id number]   CHOLECYSTECTOMY N/A 03/04/2018   Procedure: LAPAROSCOPIC CHOLECYSTECTOMY;  Surgeon: Leafy Ro, MD;  Location: [facility] ORS;  Service: General;  Laterality: N/A;   TOTAL ABDOMINAL HYSTERECTOMY      Family History  Problem Relation Age of Onset   Dementia Mother    Heart failure Father  Heart attack Sister    Heart attack Brother    Breast cancer Maternal Aunt     Social History   Socioeconomic History   Marital status: Single    Spouse name: Not on file   Number of children: Not on file   Years of education: Not on file   Highest education level: Not on file  Occupational History   Not on file  Tobacco Use   Smoking status: Never   Smokeless tobacco: Never  Vaping Use   Vaping Use: Never used  Substance and Sexual  Activity   Alcohol use: No   Drug use: No   Sexual activity: Not Currently    Birth control/protection: Post-menopausal  Other Topics Concern   Not on file  Social History Narrative   Not on file   Social Determinants of Health   Financial Resource Strain: Low Risk  (08/29/2022)   Overall Financial Resource Strain (CARDIA)    Difficulty of Paying Living Expenses: Not hard at all  Food Insecurity: No Food Insecurity (08/29/2022)   Hunger Vital Sign    Worried About Running Out of Food in the Last Year: Never true    Ran Out of Food in the Last Year: Never true  Transportation Needs: No Transportation Needs (08/29/2022)   PRAPARE - Administrator, Civil Service (Medical): No    Lack of Transportation (Non-Medical): No  Physical Activity: Insufficiently Active (08/29/2022)   Exercise Vital Sign    Days of Exercise per Week: 4 days    Minutes of Exercise per Session: 30 min  Stress: No Stress Concern Present (08/29/2022)   [employer] of Occupational Health - Occupational Stress Questionnaire    Feeling of Stress : Not at all  Social Connections: Socially Isolated (08/29/2022)   Social Connection and Isolation Panel [NHANES]    Frequency of Communication with Friends and Family: More than three times a week    Frequency of Social Gatherings with Friends and Family: More than three times a week    Attends Religious Services: Never    Database administrator or Organizations: No    Attends Banker Meetings: Never    Marital Status: Widowed  Intimate Partner Violence: Not At Risk (08/29/2022)   Humiliation, Afraid, Rape, and Kick questionnaire    Fear of Current or Ex-Partner: No    Emotionally Abused: No    Physically Abused: No    Sexually Abused: No    Review of Systems  Constitutional:  Negative for chills and fever.  Eyes:  Negative for blurred vision.  Respiratory:  Negative for shortness of breath.   Genitourinary:  Positive for dysuria and  hematuria. Negative for frequency and urgency.  Musculoskeletal:  Positive for joint pain.  Neurological:  Negative for headaches.        Objective    There were no vitals taken for this visit.  Physical Exam Constitutional:      Appearance: Normal appearance.  HENT:     Head: Normocephalic and atraumatic.  Eyes:     Conjunctiva/sclera: Conjunctivae normal.  Cardiovascular:     Rate and Rhythm: Normal rate and regular rhythm.  Pulmonary:     Effort: Pulmonary effort is normal.     Breath sounds: Normal breath sounds.  Musculoskeletal:     Right knee: No swelling or erythema. Normal range of motion. Tenderness present. No medial joint line, lateral joint line, MCL, LCL, ACL, PCL or patellar tendon tenderness.  Left knee: Normal.     Right lower leg: No edema.     Left lower leg: No edema.     Comments: Tenderness at quadriceps femoris insertion point  Skin:    General: Skin is warm and dry.     Comments: Small, flesh colored scabbed lesion on upper aspect of left eyelid, no signs of infection present   Neurological:     General: No focal deficit present.     Mental Status: She is alert. Mental status is at baseline.  Psychiatric:        Mood and Affect: Mood normal.        Behavior: Behavior normal.         Assessment & Plan:   1. Hypertension, unspecified type: Blood pressure stable, will change medication to HCTZ 12.5 mg daily and continue to monitor blood pressure at home. Follow up here in 3 months.   - hydrochlorothiazide (MICROZIDE) 12.5 MG capsule; Take 1 capsule (12.5 mg total) by mouth daily.  Dispense: 90 capsule; Refill: 0  2. Hypertriglyceridemia: Doing well on Tricor 48 mg, continue medication and plan to recheck lipids in September.   3. Dysuria/Cystitis: UA positive for nitrates, leukocytes and blood. Will prescribe Bactrim x 3 days and obtain urine culture.   - POCT Urinalysis Dipstick - Urine Culture - sulfamethoxazole-trimethoprim (BACTRIM  DS) 800-160 MG tablet; Take 1 tablet by mouth 2 (two) times daily for 3 days.  Dispense: 6 tablet; Refill: 0  4. Lesion of left upper eyelid: Uncertain etiology but lesion is scabbing and bleeding, due to history of multiple skin cancers, patient will call dermatologist today to get an appointment for biopsy.    No follow-ups on file.   Margarita Mail, DO

## 2022-09-09 ENCOUNTER — Ambulatory Visit (INDEPENDENT_AMBULATORY_CARE_PROVIDER_SITE_OTHER): Payer: 59 | Admitting: Internal Medicine

## 2022-09-09 ENCOUNTER — Encounter: Payer: Self-pay | Admitting: Internal Medicine

## 2022-09-09 VITALS — BP 124/80 | HR 89 | Temp 98.1°F | Resp 18 | Ht 60.0 in | Wt 164.7 lb

## 2022-09-09 DIAGNOSIS — Z23 Encounter for immunization: Secondary | ICD-10-CM

## 2022-09-09 DIAGNOSIS — E781 Pure hyperglyceridemia: Secondary | ICD-10-CM | POA: Diagnosis not present

## 2022-09-09 DIAGNOSIS — M1990 Unspecified osteoarthritis, unspecified site: Secondary | ICD-10-CM

## 2022-09-09 DIAGNOSIS — I10 Essential (primary) hypertension: Secondary | ICD-10-CM

## 2022-09-09 DIAGNOSIS — M62838 Other muscle spasm: Secondary | ICD-10-CM

## 2022-09-09 MED ORDER — TIZANIDINE HCL 4 MG PO TABS
4.0000 mg | ORAL_TABLET | Freq: Every evening | ORAL | 0 refills | Status: DC | PRN
Start: 2022-09-09 — End: 2022-10-06

## 2022-09-09 MED ORDER — CELECOXIB 50 MG PO CAPS
50.0000 mg | ORAL_CAPSULE | Freq: Every day | ORAL | 0 refills | Status: AC | PRN
Start: 2022-09-09 — End: ?

## 2022-09-09 NOTE — Telephone Encounter (Signed)
Requested Prescriptions  Pending Prescriptions Disp Refills   hydrochlorothiazide (MICROZIDE) 12.5 MG capsule [Pharmacy Med Name: HYDROCHLOROTHIAZIDE 12.5MG  CAPSULES] 90 capsule 0    Sig: TAKE 1 CAPSULE(12.5 MG) BY MOUTH DAILY     Cardiovascular: Diuretics - Thiazide Failed - 09/07/2022 11:32 AM      Failed - Cr in normal range and within 180 days    Creatinine, Ser  Date Value Ref Range Status  12/31/2021 0.84 0.57 - 1.00 mg/dL Final         Failed - K in normal range and within 180 days    Potassium  Date Value Ref Range Status  12/31/2021 3.8 3.5 - 5.2 mmol/L Final         Failed - Na in normal range and within 180 days    Sodium  Date Value Ref Range Status  12/31/2021 142 134 - 144 mmol/L Final         Passed - Last BP in normal range    BP Readings from Last 1 Encounters:  06/10/22 130/78         Passed - Valid encounter within last 6 months    Recent Outpatient Visits           3 months ago Cystitis   [facility] Margarita Mail, DO   4 months ago Elevated blood pressure reading   [facility] Margarita Mail, DO   7 months ago Close exposure to COVID-19 virus   [facility] [place], [place], DO   8 months ago Primary hypertension   [facility] [place], Megan P, DO   9 months ago Routine general medical examination at a health care facility   [facility] [place], Oralia Rud, [place]       Future Appointments             Today Margarita Mail, DO [facility]   In 2 weeks Willeen Niece, MD [facility]

## 2022-09-09 NOTE — Patient Instructions (Signed)
It was great seeing you today!  Plan discussed at today's visit: -Continue hydrochlorothiazide 12.5 mg daily, can cut 25 mg in half -Take Celebrex 50 mg daily as needed for pain/arthritis with food -Can also take Zanaflex (muscle relaxer) as needed at night time -Pneumonia vaccine today -Please bring emotional support animal form  Follow up in: 6 months  Take care and let us know if you have any questions or concerns prior to your next visit.  Dr. Caralee Ates

## 2022-09-23 ENCOUNTER — Ambulatory Visit (INDEPENDENT_AMBULATORY_CARE_PROVIDER_SITE_OTHER): Payer: 59 | Admitting: Dermatology

## 2022-09-23 ENCOUNTER — Encounter: Payer: Self-pay | Admitting: Dermatology

## 2022-09-23 VITALS — BP 130/69

## 2022-09-23 DIAGNOSIS — D492 Neoplasm of unspecified behavior of bone, soft tissue, and skin: Secondary | ICD-10-CM

## 2022-09-23 DIAGNOSIS — L820 Inflamed seborrheic keratosis: Secondary | ICD-10-CM | POA: Diagnosis not present

## 2022-09-23 DIAGNOSIS — Z85828 Personal history of other malignant neoplasm of skin: Secondary | ICD-10-CM

## 2022-09-23 DIAGNOSIS — L578 Other skin changes due to chronic exposure to nonionizing radiation: Secondary | ICD-10-CM | POA: Diagnosis not present

## 2022-09-23 DIAGNOSIS — W908XXA Exposure to other nonionizing radiation, initial encounter: Secondary | ICD-10-CM | POA: Diagnosis not present

## 2022-09-23 DIAGNOSIS — C4401 Basal cell carcinoma of skin of lip: Secondary | ICD-10-CM

## 2022-09-23 NOTE — Progress Notes (Signed)
Follow-Up Visit   Subjective  Vanessa Casey is a 72 y.o. female who presents for the following: BCCs need  EDC L upper forehead at hairline- biopsy was performed almost a year ago, Upper mid forehead, check spots neck x 2 itchy   The following portions of the chart were reviewed this encounter and updated as appropriate: medications, allergies, medical history  Review of Systems:  No other skin or systemic complaints except as noted in HPI or Assessment and Plan.  Objective  Well appearing patient in no apparent distress; mood and affect are within normal limits.   A focused examination was performed of the following areas: face  Relevant exam findings are noted in the Assessment and Plan.       L upper lip near nasolabial fold 10.0 x 8.17mm flesh white firm pap with central ulceration       R neck x 1, L upper chest x 1 (2) Stuck on waxy paps with erythema    Assessment & Plan   ACTINIC DAMAGE - chronic, secondary to cumulative UV radiation exposure/sun exposure over time - diffuse scaly erythematous macules with underlying dyspigmentation - Recommend daily broad spectrum sunscreen SPF 30+ to sun-exposed areas, reapply every 2 hours as needed.  - Recommend staying in the shade or wearing long sleeves, sun glasses (UVA+UVB protection) and wide brim hats (4-inch brim around the entire circumference of the hat). - Call for new or changing lesions.   HISTORY OF BASAL CELL CARCINOMA OF THE SKIN - No evidence of recurrence today, see above photo upper forehead - Recommend regular full body skin exams - Recommend daily broad spectrum sunscreen SPF 30+ to sun-exposed areas, reapply every 2 hours as needed.  - Call if any new or changing lesions are noted between office visits  - Upper forehead at hairline, Upper mid forehead clear with biopsy from 11/08/22.  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  Thought to be a chalazion last visit in derm clinic, but hasn't resolved and has bled off and on. Recommend doing bx at [facility] since at margin of eyelid, patient has an appointment in August 2024  Neoplasm of skin L upper lip near nasolabial fold  Epidermal / dermal shaving  Lesion diameter (cm):  1 Informed consent: discussed and consent obtained   Patient was prepped and draped in usual sterile fashion: area prepped with alcohol. Anesthesia: the lesion was anesthetized in a standard fashion   Anesthetic:  1% lidocaine w/ epinephrine 1-100,000 buffered w/ 8.4% NaHCO3 Instrument used: flexible razor blade   Hemostasis achieved with: pressure, aluminum chloride and electrodesiccation   Outcome: patient tolerated procedure well   Post-procedure details: wound care instructions given   Post-procedure details comment:  Ointment and small bandage applied  Specimen 1 - Surgical pathology Differential Diagnosis: Recurrent Angiofibroma r/o BCC  Check Margins: yes 10.0 x 8.14mm flesh white firm pap with central ulceration [phone]  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  Will repeat biopsy today.  Inflamed seborrheic keratosis (2) R neck x 1, L upper chest x 1  Symptomatic, irritating, patient would like treated.   Destruction of lesion - R neck x 1, L upper chest x 1 (2)  Destruction method: cryotherapy   Informed  consent: discussed and consent obtained   Lesion destroyed using liquid nitrogen: Yes   Region frozen until ice ball extended beyond lesion: Yes   Outcome: patient tolerated procedure well with no complications   Post-procedure details: wound care instructions given   Additional details:  Prior to procedure, discussed risks of blister formation, small wound, skin  dyspigmentation, or rare scar following cryotherapy. Recommend Vaseline ointment to treated areas while healing.     Return in about 6 months (around 03/26/2023) for TBSE, Hx of BCC, Hx of Dysplastic nevi.  I, Ardis Rowan, RMA, am acting as scribe for Willeen Niece, MD .   Documentation: I have reviewed the above documentation for accuracy and completeness, and I agree with the above.  Willeen Niece, MD

## 2022-09-23 NOTE — Patient Instructions (Addendum)
Wound Care Instructions  Cleanse wound gently with soap and water once a day then pat dry with clean gauze. Apply a thin coat of Petrolatum (petroleum jelly, "Vaseline") over the wound (unless you have an allergy to this). We recommend that you use a new, sterile tube of Vaseline. Do not pick or remove scabs. Do not remove the yellow or white "healing tissue" from the base of the wound.  Cover the wound with fresh, clean, nonstick gauze and secure with paper tape. You may use Band-Aids in place of gauze and tape if the wound is small enough, but would recommend trimming much of the tape off as there is often too much. Sometimes Band-Aids can irritate the skin.  You should call the office for your biopsy report after 1 week if you have not already been contacted.  If you experience any problems, such as abnormal amounts of bleeding, swelling, significant bruising, significant pain, or evidence of infection, please call the office immediately.  FOR ADULT SURGERY PATIENTS: If you need something for pain relief you may take 1 extra strength Tylenol (acetaminophen) AND 2 Ibuprofen (200mg each) together every 4 hours as needed for pain. (do not take these if you are allergic to them or if you have a reason you should not take them.) Typically, you may only need pain medication for 1 to 3 days.     Cryotherapy Aftercare  Wash gently with soap and water everyday.   Apply Vaseline and Band-Aid daily until healed.    Due to recent changes in healthcare laws, you may see results of your pathology and/or laboratory studies on MyChart before the doctors have had a chance to review them. We understand that in some cases there may be results that are confusing or concerning to you. Please understand that not all results are received at the same time and often the doctors may need to interpret multiple results in order to provide you with the best plan of care or course of treatment. Therefore, we ask that you  please give us 2 business days to thoroughly review all your results before contacting the office for clarification. Should we see a critical lab result, you will be contacted sooner.   If You Need Anything After Your Visit  If you have any questions or concerns for your doctor, please call our main line at [phone] and press option 4 to reach your doctor's medical assistant. If no one answers, please leave a voicemail as directed and we will return your call as soon as possible. Messages left after 4 pm will be answered the following business day.   You may also send us a message via MyChart. We typically respond to MyChart messages within 1-2 business days.  For prescription refills, please ask your pharmacy to contact our office. Our fax number is [phone].  If you have an urgent issue when the clinic is closed that cannot wait until the next business day, you can page your doctor at the number below.    Please note that while we do our best to be available for urgent issues outside of office hours, we are not available 24/7.   If you have an urgent issue and are unable to reach us, you may choose to seek medical care at your doctor's office, retail clinic, urgent care center, or emergency room.  If you have a medical emergency, please immediately call 911 or go to the emergency department.  Pager Numbers  - Dr. Kowalski: [phone]  -   Dr. Moye: [phone]  - Dr. Stewart: [phone]  In the event of inclement weather, please call our main line at [phone] for an update on the status of any delays or closures.  Dermatology Medication Tips: Please keep the boxes that topical medications come in in order to help keep track of the instructions about where and how to use these. Pharmacies typically print the medication instructions only on the boxes and not directly on the medication tubes.   If your medication is too expensive, please contact our office at  [phone] option 4 or send us a message through MyChart.   We are unable to tell what your co-pay for medications will be in advance as this is different depending on your insurance coverage. However, we may be able to find a substitute medication at lower cost or fill out paperwork to get insurance to cover a needed medication.   If a prior authorization is required to get your medication covered by your insurance company, please allow us 1-2 business days to complete this process.  Drug prices often vary depending on where the prescription is filled and some pharmacies may offer cheaper prices.  The website www.goodrx.com contains coupons for medications through different pharmacies. The prices here do not account for what the cost may be with help from insurance (it may be cheaper with your insurance), but the website can give you the price if you did not use any insurance.  - You can print the associated coupon and take it with your prescription to the pharmacy.  - You may also stop by our office during regular business hours and pick up a GoodRx coupon card.  - If you need your prescription sent electronically to a different pharmacy, notify our office through [facility] MyChart or by phone at [phone] option 4.     Si Usted Necesita Algo Despus de Su Visita  Tambin puede enviarnos un mensaje a travs de MyChart. Por lo general respondemos a los mensajes de MyChart en el transcurso de 1 a 2 das hbiles.  Para renovar recetas, por favor pida a su farmacia que se ponga en contacto con nuestra oficina. Nuestro nmero de fax es el [phone].  Si tiene un asunto urgente cuando la clnica est cerrada y que no puede esperar hasta el siguiente da hbil, puede llamar/localizar a su doctor(a) al nmero que aparece a continuacin.   Por favor, tenga en cuenta que aunque hacemos todo lo posible para estar disponibles para asuntos urgentes fuera del horario de oficina, no estamos  disponibles las 24 horas del da, los 7 das de la semana.   Si tiene un problema urgente y no puede comunicarse con nosotros, puede optar por buscar atencin mdica  en el consultorio de su doctor(a), en una clnica privada, en un centro de atencin urgente o en una sala de emergencias.  Si tiene una emergencia mdica, por favor llame inmediatamente al 911 o vaya a la sala de emergencias.  Nmeros de bper  - Dr. Kowalski: [phone]  - Dra. Moye: [phone]  - Dra. Stewart: [phone]  En caso de inclemencias del tiempo, por favor llame a nuestra lnea principal al [phone] para una actualizacin sobre el estado de cualquier retraso o cierre.  Consejos para la medicacin en dermatologa: Por favor, guarde las cajas en las que vienen los medicamentos de uso tpico para ayudarle a seguir las instrucciones sobre dnde y cmo usarlos. Las farmacias generalmente imprimen las instrucciones del medicamento slo en las cajas y   no directamente en los tubos del medicamento.   Si su medicamento es muy caro, por favor, pngase en contacto con nuestra oficina llamando al [phone] y presione la opcin 4 o envenos un mensaje a travs de MyChart.   No podemos decirle cul ser su copago por los medicamentos por adelantado ya que esto es diferente dependiendo de la cobertura de su seguro. Sin embargo, es posible que podamos encontrar un medicamento sustituto a menor costo o llenar un formulario para que el seguro cubra el medicamento que se considera necesario.   Si se requiere una autorizacin previa para que su compaa de seguros cubra su medicamento, por favor permtanos de 1 a 2 das hbiles para completar este proceso.  Los precios de los medicamentos varan con frecuencia dependiendo del lugar de dnde se surte la receta y alguna farmacias pueden ofrecer precios ms baratos.  El sitio web www.goodrx.com tiene cupones para medicamentos de diferentes farmacias. Los precios aqu no  tienen en cuenta lo que podra costar con la ayuda del seguro (puede ser ms barato con su seguro), pero el sitio web puede darle el precio si no utiliz ningn seguro.  - Puede imprimir el cupn correspondiente y llevarlo con su receta a la farmacia.  - Tambin puede pasar por nuestra oficina durante el horario de atencin regular y recoger una tarjeta de cupones de GoodRx.  - Si necesita que su receta se enve electrnicamente a una farmacia diferente, informe a nuestra oficina a travs de MyChart de [place] o por telfono llamando al [phone] y presione la opcin 4.  

## 2022-09-30 ENCOUNTER — Other Ambulatory Visit: Payer: Self-pay

## 2022-09-30 ENCOUNTER — Telehealth: Payer: Self-pay

## 2022-09-30 DIAGNOSIS — C4401 Basal cell carcinoma of skin of lip: Secondary | ICD-10-CM

## 2022-09-30 NOTE — Telephone Encounter (Signed)
-----   Message from Willeen Niece sent at 09/29/2022  9:20 PM EDT ----- Skin , left upper lip near nasolabial fold BASAL CELL CARCINOMA, NODULAR PATTERN, DEEP MARGIN INVOLVED  BCC skin cancer, recommend Mohs surgery [facility] or per pt preference)   Mohs surgery involves cutting out right around the spot and then checking under the microscope to be sure the whole skin cancer is out. The cure rate is about 98-99%. It is done at another office outside of [place] ([place], [address], or [place]) by a specialist. Once the [employer] navy officer confirms the skin cancer is out, they will discuss the options to repair or heal the area. Often surgical reconstruction of the area is necessary and is done at the same visit after the skin cancer has been removed.  You must take it easy for about two weeks after surgery (no lifting over 10-15 lbs, avoid activity to get your heart rate and blood pressure up).    - please call patient

## 2022-09-30 NOTE — Telephone Encounter (Signed)
Advised patient biopsy of the left upper lip near nasolabial fold was [facility] and recommend Mohs surgery. Patient prefers [facility], will send referral.

## 2022-10-06 ENCOUNTER — Other Ambulatory Visit: Payer: Self-pay | Admitting: Internal Medicine

## 2022-10-06 DIAGNOSIS — M62838 Other muscle spasm: Secondary | ICD-10-CM

## 2022-10-06 NOTE — Telephone Encounter (Signed)
Requested medication (s) are due for refill today-yes   Requested medication (s) are on the active medication list -yes  Future visit scheduled -yes  Last refill: 09/09/22 #30  Notes to clinic: non delegated Rx  Requested Prescriptions  Pending Prescriptions Disp Refills   tiZANidine (ZANAFLEX) 4 MG tablet [Pharmacy Med Name: TIZANIDINE 4MG  TABLETS] 30 tablet 0    Sig: TAKE 1 TABLET(4 MG) BY MOUTH AT BEDTIME AS NEEDED FOR MUSCLE SPASMS     Not Delegated - Cardiovascular:  Alpha-2 Agonists - tizanidine Failed - 10/06/2022  3:33 AM      Failed - This refill cannot be delegated      Passed - Valid encounter within last 6 months    Recent Outpatient Visits           3 weeks ago Hypertension, unspecified type   [facility]ita Mail, DO   3 months ago Cystitis   [facility] Margarita Mail, DO   5 months ago Elevated blood pressure reading   [facility]ita Mail, DO   8 months ago Close exposure to COVID-19 virus   [place] [facility] [place], Megan P, DO   9 months ago Primary hypertension   [facility] [place], Oralia Rud, DO       Future Appointments             In 5 months Margarita Mail, DO [facility], [facility]   In 6 months Willeen Niece, MD [facility]               Requested Prescriptions  Pending Prescriptions Disp Refills   tiZANidine (ZANAFLEX) 4 MG tablet [Pharmacy Med Name: TIZANIDINE 4MG  TABLETS] 30 tablet 0    Sig: TAKE 1 TABLET(4 MG) BY MOUTH AT BEDTIME AS NEEDED FOR MUSCLE SPASMS     Not Delegated - Cardiovascular:  Alpha-2 Agonists - tizanidine Failed - 10/06/2022  3:33 AM      Failed - This refill cannot be delegated      Passed - Valid encounter within last 6 months    Recent Outpatient Visits           3 weeks ago Hypertension, unspecified type   [facility] Margarita Mail, DO   3 months ago Cystitis   [facility], DO   5 months ago Elevated blood pressure reading   [facility] Marg[facility]ita Mail, DO   8 months ago Close exposure to COVID-19 virus   [facility] [employer], [place], DO   9 months ago Primary hypertension   [facility] [place], Oralia Rud, DO       Future Appointments             In 5 months Margarita Mail, DO [facility], [facility]   In 6 months Willeen Niece, MD [facility]

## 2022-10-16 DIAGNOSIS — D23121 Other benign neoplasm of skin of left upper eyelid, including canthus: Secondary | ICD-10-CM | POA: Diagnosis not present

## 2022-10-22 ENCOUNTER — Telehealth: Payer: Self-pay

## 2022-10-22 NOTE — Telephone Encounter (Signed)
Called patient and she states she has not heard from [facility] regarding Mohs surgery for [facility] of the left upper lip near nasolabial. [facility] states they tried to call her on August 5, but patient never called back. Number given to patient to call [facility] back to schedule Mohs surgery.

## 2022-11-04 ENCOUNTER — Other Ambulatory Visit: Payer: Self-pay | Admitting: Internal Medicine

## 2022-11-04 DIAGNOSIS — M62838 Other muscle spasm: Secondary | ICD-10-CM

## 2022-11-04 NOTE — Telephone Encounter (Signed)
Requested medication (s) are due for refill today: Due 11/06/22  Requested medication (s) are on the active medication list: yes    Last refill: 10/06/22  #30  0 refills  Future visit scheduled yes 03/12/23  Notes to clinic: Not delegated, please review. Thank you.  Requested Prescriptions  Pending Prescriptions Disp Refills   tiZANidine (ZANAFLEX) 4 MG tablet [Pharmacy Med Name: TIZANIDINE 4MG  TABLETS] 30 tablet 0    Sig: TAKE 1 TABLET(4 MG) BY MOUTH AT BEDTIME AS NEEDED FOR MUSCLE SPASMS     Not Delegated - Cardiovascular:  Alpha-2 Agonists - tizanidine Failed - 11/04/2022  3:34 AM      Failed - This refill cannot be delegated      Passed - Valid encounter within last 6 months    Recent Outpatient Visits           1 month ago Hypertension, unspecified type   [facility] Marg[facility]ita Mail, DO   4 months ago Cystitis   [facility] Mail, DO   6 months ago Elevated blood pressure reading   [facility], DO   9 months ago Close exposure to COVID-19 virus   [place] [facility] [place], Megan P, DO   10 months ago Primary hypertension   [facility] [place], Oralia Rud, DO       Future Appointments             In 4 months Margarita Mail, DO [facility], [facility]   In 5 months Willeen Niece, MD [facility] [address] [facility]

## 2022-11-05 NOTE — Telephone Encounter (Signed)
Patient does not need. 

## 2022-11-17 NOTE — Telephone Encounter (Signed)
Talked to patient regarding Mohs referral. She has not had time to call and schedule appt due to her sister being sick. She will call us with appt day/time once scheduled.

## 2022-12-02 NOTE — Telephone Encounter (Signed)
Left message for patient to call about Mohs appointment not scheduled yet for [facility] of the left upper lip near nasolabial fold.

## 2022-12-03 ENCOUNTER — Other Ambulatory Visit: Payer: Self-pay | Admitting: Family Medicine

## 2022-12-04 NOTE — Telephone Encounter (Signed)
D/C 12/31/21. Requested Prescriptions  Refused Prescriptions Disp Refills   hydrochlorothiazide (HYDRODIURIL) 25 MG tablet [Pharmacy Med Name: HYDROCHLOROTHIAZIDE 25MG  TABLETS] 90 tablet 0    Sig: TAKE 1 TABLET(25 MG) BY MOUTH DAILY     Cardiovascular: Diuretics - Thiazide Failed - 12/03/2022  8:07 AM      Failed - Cr in normal range and within 180 days    Creatinine, Ser  Date Value Ref Range Status  12/31/2021 0.84 0.57 - 1.00 mg/dL Final         Failed - K in normal range and within 180 days    Potassium  Date Value Ref Range Status  12/31/2021 3.8 3.5 - 5.2 mmol/L Final         Failed - Na in normal range and within 180 days    Sodium  Date Value Ref Range Status  12/31/2021 142 134 - 144 mmol/L Final         Passed - Last BP in normal range    BP Readings from Last 1 Encounters:  09/23/22 130/69         Passed - Valid encounter within last 6 months    Recent Outpatient Visits           2 months ago Hypertension, unspecified type   [facility], DO   5 months ago Cystitis   [facility], DO   7 months ago Elevated blood pressure reading   [facility], DO   10 months ago Close exposure to COVID-19 virus   [facility] [place], [place], DO   11 months ago Primary hypertension   [place] [place], Oralia Rud, DO       Future Appointments             In 3 months Margarita Mail, DO [facility], [facility]   In 4 months Willeen Niece, MD [facility] [place] [facility]

## 2022-12-05 ENCOUNTER — Other Ambulatory Visit: Payer: Self-pay | Admitting: Internal Medicine

## 2022-12-05 DIAGNOSIS — I10 Essential (primary) hypertension: Secondary | ICD-10-CM

## 2022-12-05 DIAGNOSIS — M62838 Other muscle spasm: Secondary | ICD-10-CM

## 2022-12-05 DIAGNOSIS — M1990 Unspecified osteoarthritis, unspecified site: Secondary | ICD-10-CM

## 2022-12-05 NOTE — Telephone Encounter (Signed)
Requested medications are due for refill today.  yes  Requested medications are on the active medications list.  yes  Last refill. 09/09/2022 #90 0 rf  Future visit scheduled.   Yes  Notes to clinic.  Labs are expired    Requested Prescriptions  Pending Prescriptions Disp Refills   celecoxib (CELEBREX) 50 MG capsule [Pharmacy Med Name: CELECOXIB 50MG  CAPSULES] 90 capsule 0    Sig: TAKE 1 CAPSULE(50 MG) BY MOUTH DAILY AS NEEDED FOR PAIN     Analgesics:  COX2 Inhibitors Failed - 12/05/2022  8:08 AM      Failed - Manual Review: Labs are only required if the patient has taken medication for more than 8 weeks.      Failed - HGB in normal range and within 360 days    Hemoglobin  Date Value Ref Range Status  12/03/2021 11.5 11.1 - 15.9 g/dL Final         Failed - HCT in normal range and within 360 days    Hematocrit  Date Value Ref Range Status  12/03/2021 34.7 34.0 - 46.6 % Final         Failed - AST in normal range and within 360 days    AST  Date Value Ref Range Status  12/03/2021 17 0 - 40 IU/L Final         Failed - ALT in normal range and within 360 days    ALT  Date Value Ref Range Status  12/03/2021 11 0 - 32 IU/L Final         Passed - Cr in normal range and within 360 days    Creatinine, Ser  Date Value Ref Range Status  12/31/2021 0.84 0.57 - 1.00 mg/dL Final         Passed - eGFR is 30 or above and within 360 days    GFR calc Af Amer  Date Value Ref Range Status  07/14/2019 95 >59 mL/min/1.73 Final    Comment:    **[facility] currently reports eGFR in compliance with the current**   recommendations of the [employer]. [facility] will   update reporting as new guidelines are published from the NKF-ASN   Task force.    GFR calc non Af Amer  Date Value Ref Range Status  07/14/2019 83 >59 mL/min/1.73 Final   eGFR  Date Value Ref Range Status  12/31/2021 75 >59 mL/min/1.73 Final         Passed - Patient is not pregnant      Passed - Valid  encounter within last 12 months    Recent Outpatient Visits           2 months ago Hypertension, unspecified type   [facility] Mail, DO   5 months ago Cystitis   [facility], DO   7 months ago Elevated blood pressure reading   [facility], DO   10 months ago Close exposure to COVID-19 virus   [facility] [place], [place], DO   11 months ago Primary hypertension   [place] [facility] Dorcas Carrow, DO       Future Appointments             In 3 months Margarita Mail, DO [facility], [facility]   In 4 months Willeen Niece, MD [facility] [place] [facility]  hydrochlorothiazide (MICROZIDE) 12.5 MG capsule [Pharmacy Med Name: HYDROCHLOROTHIAZIDE 12.5MG  CAPSULES] 90 capsule 0    Sig: TAKE 1 CAPSULE(12.5 MG) BY MOUTH DAILY     Cardiovascular: Diuretics - Thiazide Failed - 12/05/2022  8:08 AM      Failed - Cr in normal range and within 180 days    Creatinine, Ser  Date Value Ref Range Status  12/31/2021 0.84 0.57 - 1.00 mg/dL Final         Failed - K in normal range and within 180 days    Potassium  Date Value Ref Range Status  12/31/2021 3.8 3.5 - 5.2 mmol/L Final         Failed - Na in normal range and within 180 days    Sodium  Date Value Ref Range Status  12/31/2021 142 134 - 144 mmol/L Final         Passed - Last BP in normal range    BP Readings from Last 1 Encounters:  09/23/22 130/69         Passed - Valid encounter within last 6 months    Recent Outpatient Visits           2 months ago Hypertension, unspecified type   [facility], DO   5 months ago Cystitis   [facility] Margarita Mail, DO   7 months ago Elevated blood pressure reading   [facility] Margarita Mail, DO   10 months ago Close exposure to COVID-19 virus   [facility] [place], [place], DO   11 months ago Primary hypertension   [place] [facility] [place], Oralia Rud, DO       Future Appointments             In 3 months Margarita Mail, DO [facility], [facility]   In 4 months Willeen Niece, MD [facility] [place] [facility]

## 2022-12-18 DIAGNOSIS — D23121 Other benign neoplasm of skin of left upper eyelid, including canthus: Secondary | ICD-10-CM | POA: Diagnosis not present

## 2022-12-18 DIAGNOSIS — H02831 Dermatochalasis of right upper eyelid: Secondary | ICD-10-CM | POA: Diagnosis not present

## 2022-12-23 NOTE — Telephone Encounter (Signed)
Left message for patient to call back re:Mohs referral. Also, sent patient a MyChart message.

## 2023-01-07 NOTE — Telephone Encounter (Signed)
Left message for patient to call office for an office visit to recheck [facility] site of the left upper lip. Dr Roseanne Reno may be able to treat in office.

## 2023-01-15 ENCOUNTER — Other Ambulatory Visit: Payer: Self-pay | Admitting: Internal Medicine

## 2023-01-15 DIAGNOSIS — E781 Pure hyperglyceridemia: Secondary | ICD-10-CM

## 2023-01-15 NOTE — Telephone Encounter (Signed)
Requested Prescriptions  Pending Prescriptions Disp Refills   fenofibrate (TRICOR) 48 MG tablet [Pharmacy Med Name: FENOFIBRATE 48MG  TABLETS] 90 tablet 0    Sig: TAKE 1 TABLET(48 MG) BY MOUTH DAILY     Cardiovascular:  Antilipid - Fibric Acid Derivatives Failed - 01/15/2023  3:33 AM      Failed - ALT in normal range and within 360 days    ALT  Date Value Ref Range Status  12/03/2021 11 0 - 32 IU/L Final         Failed - AST in normal range and within 360 days    AST  Date Value Ref Range Status  12/03/2021 17 0 - 40 IU/L Final         Failed - Cr in normal range and within 360 days    Creatinine, Ser  Date Value Ref Range Status  12/31/2021 0.84 0.57 - 1.00 mg/dL Final         Failed - HGB in normal range and within 360 days    Hemoglobin  Date Value Ref Range Status  12/03/2021 11.5 11.1 - 15.9 g/dL Final         Failed - HCT in normal range and within 360 days    Hematocrit  Date Value Ref Range Status  12/03/2021 34.7 34.0 - 46.6 % Final         Failed - PLT in normal range and within 360 days    Platelets  Date Value Ref Range Status  12/03/2021 269 150 - 450 x10E3/uL Final         Failed - WBC in normal range and within 360 days    WBC  Date Value Ref Range Status  12/03/2021 7.3 3.4 - 10.8 x10E3/uL Final  03/04/2018 10.8 (H) 4.0 - 10.5 K/uL Final         Failed - eGFR is 30 or above and within 360 days    GFR calc Af Amer  Date Value Ref Range Status  07/14/2019 95 >59 mL/min/1.73 Final    Comment:    **[facility] currently reports eGFR in compliance with the current**   recommendations of the [employer]. [facility] will   update reporting as new guidelines are published from the NKF-ASN   Task force.    GFR calc non Af Amer  Date Value Ref Range Status  07/14/2019 83 >59 mL/min/1.73 Final   eGFR  Date Value Ref Range Status  12/31/2021 75 >59 mL/min/1.73 Final         Failed - Lipid Panel in normal range within the last 12 months     Cholesterol, Total  Date Value Ref Range Status  12/03/2021 197 100 - 199 mg/dL Final   LDL Chol Calc (NIH)  Date Value Ref Range Status  12/03/2021 95 0 - 99 mg/dL Final   HDL  Date Value Ref Range Status  12/03/2021 41 >39 mg/dL Final   Triglycerides  Date Value Ref Range Status  12/03/2021 363 (H) 0 - 149 mg/dL Final         Passed - Valid encounter within last 12 months    Recent Outpatient Visits           4 months ago Hypertension, unspecified type   [facility], DO   7 months ago Cystitis   [facility], DO   8 months ago Elevated blood pressure reading   [facility] Marg[facility]ita Mail,  DO   11 months ago Close exposure to COVID-19 virus   [facility] [place], [place], DO   1 year ago Primary hypertension   [place] [facility] Dorcas Carrow, DO       Future Appointments             In 1 month Margarita Mail, DO [facility], [facility]   In 3 months Willeen Niece, MD [facility] [place] [facility]

## 2023-02-17 DIAGNOSIS — L814 Other melanin hyperpigmentation: Secondary | ICD-10-CM | POA: Diagnosis not present

## 2023-02-17 DIAGNOSIS — Z85828 Personal history of other malignant neoplasm of skin: Secondary | ICD-10-CM | POA: Diagnosis not present

## 2023-02-17 DIAGNOSIS — C441991 Other specified malignant neoplasm of skin of left upper eyelid, including canthus: Secondary | ICD-10-CM | POA: Diagnosis not present

## 2023-02-17 DIAGNOSIS — L578 Other skin changes due to chronic exposure to nonionizing radiation: Secondary | ICD-10-CM | POA: Diagnosis not present

## 2023-02-17 DIAGNOSIS — L988 Other specified disorders of the skin and subcutaneous tissue: Secondary | ICD-10-CM | POA: Diagnosis not present

## 2023-02-18 DIAGNOSIS — C441091 Unspecified malignant neoplasm of skin of left upper eyelid, including canthus: Secondary | ICD-10-CM | POA: Diagnosis not present

## 2023-02-18 DIAGNOSIS — C4499 Other specified malignant neoplasm of skin, unspecified: Secondary | ICD-10-CM | POA: Diagnosis not present

## 2023-03-09 ENCOUNTER — Other Ambulatory Visit: Payer: Self-pay | Admitting: Internal Medicine

## 2023-03-09 DIAGNOSIS — M1990 Unspecified osteoarthritis, unspecified site: Secondary | ICD-10-CM

## 2023-03-09 DIAGNOSIS — I10 Essential (primary) hypertension: Secondary | ICD-10-CM

## 2023-03-09 DIAGNOSIS — M62838 Other muscle spasm: Secondary | ICD-10-CM

## 2023-03-12 ENCOUNTER — Encounter: Payer: Self-pay | Admitting: Internal Medicine

## 2023-03-12 ENCOUNTER — Ambulatory Visit (INDEPENDENT_AMBULATORY_CARE_PROVIDER_SITE_OTHER): Payer: 59 | Admitting: Internal Medicine

## 2023-03-12 VITALS — BP 128/80 | HR 90 | Temp 98.0°F | Resp 16 | Ht 60.0 in | Wt 165.4 lb

## 2023-03-12 DIAGNOSIS — Z1231 Encounter for screening mammogram for malignant neoplasm of breast: Secondary | ICD-10-CM | POA: Diagnosis not present

## 2023-03-12 DIAGNOSIS — Z1211 Encounter for screening for malignant neoplasm of colon: Secondary | ICD-10-CM | POA: Diagnosis not present

## 2023-03-12 DIAGNOSIS — E781 Pure hyperglyceridemia: Secondary | ICD-10-CM | POA: Diagnosis not present

## 2023-03-12 DIAGNOSIS — I10 Essential (primary) hypertension: Secondary | ICD-10-CM

## 2023-03-12 DIAGNOSIS — E782 Mixed hyperlipidemia: Secondary | ICD-10-CM

## 2023-03-12 DIAGNOSIS — R0789 Other chest pain: Secondary | ICD-10-CM

## 2023-03-12 DIAGNOSIS — Z23 Encounter for immunization: Secondary | ICD-10-CM | POA: Diagnosis not present

## 2023-03-12 MED ORDER — HYDROCHLOROTHIAZIDE 12.5 MG PO CAPS: 12.5000 mg | ORAL_CAPSULE | Freq: Every day | ORAL | 1 refills | Status: DC

## 2023-03-12 MED ORDER — FENOFIBRATE 48 MG PO TABS: 48.0000 mg | ORAL_TABLET | Freq: Every day | ORAL | 1 refills | Status: DC

## 2023-03-12 NOTE — Assessment & Plan Note (Signed)
 Cholesterol did improve last year but overall cardiovascular risk about 14%, now with new chest discomfort. On Tricor  but no statins. Will repeat fasting labs today but will most likely start a statin after lab results, discussed with patient and she is agreeable.

## 2023-03-12 NOTE — Telephone Encounter (Signed)
 Patient just taking PRN, discussed this with her today.

## 2023-03-12 NOTE — Telephone Encounter (Signed)
 Requested medication (s) are due for refill today: yes  Requested medication (s) are on the active medication list: yes    Last refill: 12/08/22  #90  0 refills  Future visit scheduled Yes 09/09/23  Notes to clinic:Failed due to labs, please review.  Requested Prescriptions  Pending Prescriptions Disp Refills   celecoxib  (CELEBREX ) 50 MG capsule [Pharmacy Med Name: CELECOXIB  50MG  CAPSULES] 90 capsule 0    Sig: TAKE 1 CAPSULE(50 MG) BY MOUTH DAILY AS NEEDED FOR PAIN     Analgesics:  COX2 Inhibitors Failed - 03/12/2023  3:51 PM      Failed - Manual Review: Labs are only required if the patient has taken medication for more than 8 weeks.      Failed - HGB in normal range and within 360 days    Hemoglobin  Date Value Ref Range Status  12/03/2021 11.5 11.1 - 15.9 g/dL Final         Failed - Cr in normal range and within 360 days    Creatinine, Ser  Date Value Ref Range Status  12/31/2021 0.84 0.57 - 1.00 mg/dL Final         Failed - HCT in normal range and within 360 days    Hematocrit  Date Value Ref Range Status  12/03/2021 34.7 34.0 - 46.6 % Final         Failed - AST in normal range and within 360 days    AST  Date Value Ref Range Status  12/03/2021 17 0 - 40 IU/L Final         Failed - ALT in normal range and within 360 days    ALT  Date Value Ref Range Status  12/03/2021 11 0 - 32 IU/L Final         Failed - eGFR is 30 or above and within 360 days    GFR calc Af Amer  Date Value Ref Range Status  07/14/2019 95 >59 mL/min/1.73 Final    Comment:    **[facility] currently reports eGFR in compliance with the current**   recommendations of the [employer]. [facility] will   update reporting as new guidelines are published from the NKF-ASN   Task force.    GFR calc non Af Amer  Date Value Ref Range Status  07/14/2019 83 >59 mL/min/1.73 Final   eGFR  Date Value Ref Range Status  12/31/2021 75 >59 mL/min/1.73 Final         Passed - Patient is not  pregnant      Passed - Valid encounter within last 12 months    Recent Outpatient Visits           Today Chest discomfort   [facility] Bernardo Fend, DO   6 months ago Hypertension, unspecified type   [facility], DO   9 months ago Cystitis   [facility]ardo [facility]end, DO   10 months ago Elevated blood pressure reading   [facility] Bernardo Fend, DO   1 year ago Close exposure to COVID-19 virus   [place] [facility] Vicci Duwaine SQUIBB, DO       Future Appointments             In 1 month Jackquline Sawyer, MD [facility] [place] [facility]   In 6 months Bernardo Fend, DO [facility]  Refused Prescriptions Disp Refills   hydrochlorothiazide  (MICROZIDE ) 12.5 MG capsule [Pharmacy Med Name: HYDROCHLOROTHIAZIDE  12.5MG  CAPSULES] 90 capsule 0    Sig: TAKE 1 CAPSULE(12.5 MG) BY MOUTH DAILY     Cardiovascular: Diuretics - Thiazide Failed - 03/12/2023  3:51 PM      Failed - Cr in normal range and within 180 days    Creatinine, Ser  Date Value Ref Range Status  12/31/2021 0.84 0.57 - 1.00 mg/dL Final         Failed - K in normal range and within 180 days    Potassium  Date Value Ref Range Status  12/31/2021 3.8 3.5 - 5.2 mmol/L Final         Failed - Na in normal range and within 180 days    Sodium  Date Value Ref Range Status  12/31/2021 142 134 - 144 mmol/L Final         Passed - Last BP in normal range    BP Readings from Last 1 Encounters:  03/12/23 128/80         Passed - Valid encounter within last 6 months    Recent Outpatient Visits           Today Chest discomfort   [facility] Bernardo Fend, DO   6 months ago Hypertension, unspecified type   [facility] Fend, DO   9 months ago  Cystitis   [facility] Bern[facility] Fend, DO   10 months ago Elevated blood pressure reading   [facility] Bern[facility]end, DO   1 year ago Close exposure to COVID-19 virus   [place] [facility] Vicci Duwaine SQUIBB, DO       Future Appointments             In 1 month Jackquline Sawyer, MD [facility] [place] [facility]   In 6 months Bernardo Fend, DO [facility]

## 2023-03-12 NOTE — Telephone Encounter (Signed)
 Requested Prescriptions  Pending Prescriptions Disp Refills   celecoxib  (CELEBREX ) 50 MG capsule [Pharmacy Med Name: CELECOXIB  50MG  CAPSULES] 90 capsule 0    Sig: TAKE 1 CAPSULE(50 MG) BY MOUTH DAILY AS NEEDED FOR PAIN     Analgesics:  COX2 Inhibitors Failed - 03/12/2023  3:50 PM      Failed - Manual Review: Labs are only required if the patient has taken medication for more than 8 weeks.      Failed - HGB in normal range and within 360 days    Hemoglobin  Date Value Ref Range Status  12/03/2021 11.5 11.1 - 15.9 g/dL Final         Failed - Cr in normal range and within 360 days    Creatinine, Ser  Date Value Ref Range Status  12/31/2021 0.84 0.57 - 1.00 mg/dL Final         Failed - HCT in normal range and within 360 days    Hematocrit  Date Value Ref Range Status  12/03/2021 34.7 34.0 - 46.6 % Final         Failed - AST in normal range and within 360 days    AST  Date Value Ref Range Status  12/03/2021 17 0 - 40 IU/L Final         Failed - ALT in normal range and within 360 days    ALT  Date Value Ref Range Status  12/03/2021 11 0 - 32 IU/L Final         Failed - eGFR is 30 or above and within 360 days    GFR calc Af Amer  Date Value Ref Range Status  07/14/2019 95 >59 mL/min/1.73 Final    Comment:    **[facility] currently reports eGFR in compliance with the current**   recommendations of the [employer]. [facility] will   update reporting as new guidelines are published from the NKF-ASN   Task force.    GFR calc non Af Amer  Date Value Ref Range Status  07/14/2019 83 >59 mL/min/1.73 Final   eGFR  Date Value Ref Range Status  12/31/2021 75 >59 mL/min/1.73 Final         Passed - Patient is not pregnant      Passed - Valid encounter within last 12 months    Recent Outpatient Visits           Today Chest discomfort   [facility] Bernardo Fend, DO   6 months ago Hypertension, unspecified type   [facility] Bernardo Fend, DO   9 months ago Cystitis   [facility] Bern[facility] Fend, DO   10 months ago Elevated blood pressure reading   [facility] Bern[facility] Fend, DO   1 year ago Close exposure to COVID-19 virus   [place] [facility] Vicci Duwaine SQUIBB, DO       Future Appointments             In 1 month Jackquline Sawyer, MD [facility] [place] [facility]   In 6 months Bernardo Fend, DO [facility], [facility]             hydrochlorothiazide  (MICROZIDE ) 12.5 MG capsule [Pharmacy Med Name: HYDROCHLOROTHIAZIDE  12.5MG  CAPSULES] 90 capsule 0    Sig: TAKE 1 CAPSULE(12.5 MG) BY MOUTH DAILY     Cardiovascular: Diuretics - Thiazide Failed - 03/12/2023  3:50 PM  Failed - Cr in normal range and within 180 days    Creatinine, Ser  Date Value Ref Range Status  12/31/2021 0.84 0.57 - 1.00 mg/dL Final         Failed - K in normal range and within 180 days    Potassium  Date Value Ref Range Status  12/31/2021 3.8 3.5 - 5.2 mmol/L Final         Failed - Na in normal range and within 180 days    Sodium  Date Value Ref Range Status  12/31/2021 142 134 - 144 mmol/L Final         Passed - Last BP in normal range    BP Readings from Last 1 Encounters:  03/12/23 128/80         Passed - Valid encounter within last 6 months    Recent Outpatient Visits           Today Chest discomfort   [facility] Bernardo Fend, DO   6 months ago Hypertension, unspecified type   [facility] Bern[facility] Fend, DO   9 months ago Cystitis   [facility], DO   10 months ago Elevated blood pressure reading   [facility] Bernardo Fend, DO   1 year ago Close exposure to COVID-19 virus   [place] [facility] Vicci Duwaine SQUIBB, DO        Future Appointments             In 1 month Jackquline Sawyer, MD [facility] [place] [facility]   In 6 months Bernardo Fend, DO [facility], [facility]

## 2023-03-12 NOTE — Progress Notes (Signed)
 Established Patient Office Visit  Subjective   Patient ID: Vanessa Casey, female    DOB: 1950/12/07  Age: 73 y.o. MRN: [id number]  Chief Complaint  Patient presents with   Medical Management of Chronic Issues    HPI  Patient is here for follow up on chronic medical conditions. Patient states she had a few occasions where she had felt a chest pressure/discomfort sensation. It is not painful but is worrisome. Will happen both at rest and will walking but mainly at rest. No other symptoms, no diaphoresis, SOB, etc other than left arm pain. Symptoms usually last a minute and resolve on its own. She does have a strong family history of cardiovascular disease.   Hypertension: -Medications: hydrochlorothiazide  12.5 mg  -Checking BP at home (average): 120-130/70-80 -Denies any SOB, vision changes, LE edema or symptoms of hypotension. Chest discomfort described above.   History of multiple basal cell carcinomas removed: -Had a BCC removed September 2023 on her face and forehead but recently had an eye surgery and was found to have another BCC removed on her eyelid last month -Following with dermatology on a yearly basis  Hypertriglyceridemia: -Currently on Tricor  48 mg, never been on a statin -Patient is compliant with above medications and reports no side effects.  -Last lipid panel: Lipid Panel     Component Value Date/Time   CHOL 197 12/03/2021 1046   TRIG 363 (H) 12/03/2021 1046   HDL 41 12/03/2021 1046   LDLCALC 95 12/03/2021 1046   LABVLDL 61 (H) 12/03/2021 1046    Health Maintenance: -Blood work due -Mammogram 10/23 Birads-1, mammogram ordered for this year -Colon cancer screening due - cologuard ordered previously but no results in chart -Flu vaccine today  Patient Active Problem List   Diagnosis Date Noted   Mixed hyperlipidemia 07/17/2020   HTN (hypertension) 04/24/2019   Post-operative nausea and vomiting 04/30/2017   Advance directive discussed with patient  01/12/2017   Past Medical History:  Diagnosis Date   Acute cholecystitis 03/04/2018   Atypical mole 07/30/2017   left breast/excision   Basal cell carcinoma 07/30/2017   right jaw   Basal cell carcinoma 09/16/2017   left upper arm/excision   Basal cell carcinoma 11/22/2018   right upper cutaneous lip   Basal cell carcinoma 09/23/2022   left upper lip near nasolabial fold/refer for Mohs [facility]   BCC (basal cell carcinoma of skin) 11/07/2021   upper mid forehead (Nodular)  clear wih bx 09/23/22   BCC (basal cell carcinoma of skin) 11/07/2021   left upper forehead at hairline (superficial)  clear with bx 09/23/22   Edema    FEET/ LEGS   Hypertension    Medical history non-contributory    Post-operative nausea and vomiting 04/30/2017   Shingles    Past Surgical History:  Procedure Laterality Date   BASAL CELL CARCINOMA EXCISION     CATARACT EXTRACTION W/PHACO Right 05/05/2017   Procedure: CATARACT EXTRACTION PHACO AND INTRAOCULAR LENS PLACEMENT (IOC);  Surgeon: Jaye Fallow, MD;  Location: [facility] ORS;  Service: Ophthalmology;  Laterality: Right;  US  00:33.2 AP% 15.8 CDE 5.25 Fluid Pack Lot # [id number]   CATARACT EXTRACTION W/PHACO Left 06/09/2017   Procedure: CATARACT EXTRACTION PHACO AND INTRAOCULAR LENS PLACEMENT (IOC);  Surgeon: Jaye Fallow, MD;  Location: [facility] ORS;  Service: Ophthalmology;  Laterality: Left;  US  01:02.2 AP% 15.2 CDE 9.48 Fluid Pack Lot # [id number] H   CHOLECYSTECTOMY N/A 03/04/2018   Procedure: LAPAROSCOPIC CHOLECYSTECTOMY;  Surgeon: Jordis Laneta FALCON, MD;  Location: [facility]  ORS;  Service: General;  Laterality: N/A;   TOTAL ABDOMINAL HYSTERECTOMY     Social History   Tobacco Use   Smoking status: Never   Smokeless tobacco: Never  Vaping Use   Vaping status: Never Used  Substance Use Topics   Alcohol use: No   Drug use: No   Social History   Socioeconomic History   Marital status: Single    Spouse name: Not on file   Number of children: Not on  file   Years of education: Not on file   Highest education level: Not on file  Occupational History   Not on file  Tobacco Use   Smoking status: Never   Smokeless tobacco: Never  Vaping Use   Vaping status: Never Used  Substance and Sexual Activity   Alcohol use: No   Drug use: No   Sexual activity: Not Currently    Birth control/protection: Post-menopausal  Other Topics Concern   Not on file  Social History Narrative   Not on file   Social Drivers of Health   Financial Resource Strain: Low Risk  (08/29/2022)   Overall Financial Resource Strain (CARDIA)    Difficulty of Paying Living Expenses: Not hard at all  Food Insecurity: No Food Insecurity (08/29/2022)   Hunger Vital Sign    Worried About Running Out of Food in the Last Year: Never true    Ran Out of Food in the Last Year: Never true  Transportation Needs: No Transportation Needs (08/29/2022)   PRAPARE - Administrator, Civil Service (Medical): No    Lack of Transportation (Non-Medical): No  Physical Activity: Insufficiently Active (08/29/2022)   Exercise Vital Sign    Days of Exercise per Week: 4 days    Minutes of Exercise per Session: 30 min  Stress: No Stress Concern Present (08/29/2022)   [employer] of Occupational Health - Occupational Stress Questionnaire    Feeling of Stress : Not at all  Social Connections: Socially Isolated (08/29/2022)   Social Connection and Isolation Panel [NHANES]    Frequency of Communication with Friends and Family: More than three times a week    Frequency of Social Gatherings with Friends and Family: More than three times a week    Attends Religious Services: Never    Database Administrator or Organizations: No    Attends Banker Meetings: Never    Marital Status: Widowed  Intimate Partner Violence: Not At Risk (08/29/2022)   Humiliation, Afraid, Rape, and Kick questionnaire    Fear of Current or Ex-Partner: No    Emotionally Abused: No    Physically  Abused: No    Sexually Abused: No   Family Status  Relation Name Status   Mother  (Not Specified)   Father  (Not Specified)   Sister  (Not Specified)   Brother  (Not Specified)   Mat Aunt  (Not Specified)  No partnership data on file   Family History  Problem Relation Age of Onset   Dementia Mother    Heart failure Father    Heart attack Sister    Heart attack Brother    Breast cancer Maternal Aunt    Allergies  Allergen Reactions   Lisinopril  Cough   Losartan  Other (See Comments)    edema    Review of Systems  Constitutional:  Negative for chills and fever.  Eyes:  Negative for blurred vision.  Respiratory:  Negative for shortness of breath.   Cardiovascular:  Positive for  chest pain. Negative for palpitations and leg swelling.      Objective:     BP 128/80   Pulse 90   Temp 98 F (36.7 C) (Oral)   Resp 16   Ht 5' (1.524 m)   Wt 165 lb 6.4 oz (75 kg)   SpO2 96%   BMI 32.30 kg/m  BP Readings from Last 3 Encounters:  03/12/23 128/80  09/23/22 130/69  09/09/22 124/80   Wt Readings from Last 3 Encounters:  03/12/23 165 lb 6.4 oz (75 kg)  09/09/22 164 lb 11.2 oz (74.7 kg)  08/29/22 164 lb (74.4 kg)      Physical Exam Constitutional:      Appearance: Normal appearance.  HENT:     Head: Normocephalic and atraumatic.  Eyes:     Conjunctiva/sclera: Conjunctivae normal.  Neck:     Vascular: No carotid bruit.  Cardiovascular:     Rate and Rhythm: Normal rate and regular rhythm.  Pulmonary:     Effort: Pulmonary effort is normal.     Breath sounds: Normal breath sounds.  Musculoskeletal:     Right lower leg: No edema.     Left lower leg: No edema.  Skin:    General: Skin is warm and dry.  Neurological:     General: No focal deficit present.     Mental Status: She is alert. Mental status is at baseline.  Psychiatric:        Mood and Affect: Mood normal.        Behavior: Behavior normal.      No results found for any visits on  03/12/23.  Last CBC Lab Results  Component Value Date   WBC 7.3 12/03/2021   HGB 11.5 12/03/2021   HCT 34.7 12/03/2021   MCV 85 12/03/2021   MCH 28.0 12/03/2021   RDW 12.7 12/03/2021   PLT 269 12/03/2021   Last metabolic panel Lab Results  Component Value Date   GLUCOSE 87 12/31/2021   NA 142 12/31/2021   K 3.8 12/31/2021   CL 103 12/31/2021   CO2 23 12/31/2021   BUN 15 12/31/2021   CREATININE 0.84 12/31/2021   EGFR 75 12/31/2021   CALCIUM 9.1 12/31/2021   PROT 7.0 12/03/2021   ALBUMIN 4.2 12/03/2021   LABGLOB 2.8 12/03/2021   AGRATIO 1.5 12/03/2021   BILITOT <0.2 12/03/2021   ALKPHOS 91 12/03/2021   AST 17 12/03/2021   ALT 11 12/03/2021   ANIONGAP 8 03/04/2018   Last lipids Lab Results  Component Value Date   CHOL 197 12/03/2021   HDL 41 12/03/2021   LDLCALC 95 12/03/2021   TRIG 363 (H) 12/03/2021   Last hemoglobin A1c Lab Results  Component Value Date   HGBA1C 5.7 (H) 12/11/2016   Last thyroid  functions Lab Results  Component Value Date   TSH 1.930 12/03/2021   Last vitamin D No results found for: 25OHVITD2, 25OHVITD3, VD25OH Last vitamin B12 and Folate No results found for: VITAMINB12, FOLATE    The 10-year ASCVD risk score (Arnett DK, et al., 2019) is: 14.5%    Assessment & Plan:  Chest discomfort -     EKG 12-Lead -     Ambulatory referral to Cardiology  Hypertension, unspecified type Assessment & Plan: Blood pressure stable here today, no changes made to medications and appropriate refills sent to pharmacy. Labs ordered.    Orders: -     CBC with Differential/Platelet -     COMPLETE METABOLIC PANEL WITH GFR -  hydroCHLOROthiazide ; Take 1 capsule (12.5 mg total) by mouth daily.  Dispense: 90 capsule; Refill: 1  Hypertriglyceridemia -     Lipid panel -     Fenofibrate ; Take 1 tablet (48 mg total) by mouth daily.  Dispense: 90 tablet; Refill: 1  Colon cancer screening -     Cologuard  Encounter for screening mammogram  for malignant neoplasm of breast -     3D Screening Mammogram, Left and Right; Future  Need for influenza vaccination -     Flu Vaccine Trivalent High Dose (Fluad)  Mixed hyperlipidemia Assessment & Plan: Cholesterol did improve last year but overall cardiovascular risk about 14%, now with new chest discomfort. On Tricor  but no statins. Will repeat fasting labs today but will most likely start a statin after lab results, discussed with patient and she is agreeable.    EKG with normal sinus rhythm but due to anginal symptoms and strong family history will refer to Cardiology for stress test. Plan to start statin after labs result.   Return in about 6 months (around 09/09/2023).    Sharyle Fischer, DO

## 2023-03-12 NOTE — Assessment & Plan Note (Signed)
 Blood pressure stable here today, no changes made to medications and appropriate refills sent to pharmacy. Labs ordered.

## 2023-03-13 LAB — CBC WITH DIFFERENTIAL/PLATELET
Absolute Lymphocytes: 2368 {cells}/uL (ref 850–3900)
Absolute Monocytes: 592 {cells}/uL (ref 200–950)
Basophils Absolute: 24 {cells}/uL (ref 0–200)
Basophils Relative: 0.3 %
Eosinophils Absolute: 80 {cells}/uL (ref 15–500)
Eosinophils Relative: 1 %
HCT: 38.9 % (ref 35.0–45.0)
Hemoglobin: 12.9 g/dL (ref 11.7–15.5)
MCH: 27.9 pg (ref 27.0–33.0)
MCHC: 33.2 g/dL (ref 32.0–36.0)
MCV: 84 fL (ref 80.0–100.0)
MPV: 11.1 fL (ref 7.5–12.5)
Monocytes Relative: 7.4 %
Neutro Abs: 4936 {cells}/uL (ref 1500–7800)
Neutrophils Relative %: 61.7 %
Platelets: 264 10*3/uL (ref 140–400)
RBC: 4.63 10*6/uL (ref 3.80–5.10)
RDW: 13.6 % (ref 11.0–15.0)
Total Lymphocyte: 29.6 %
WBC: 8 10*3/uL (ref 3.8–10.8)

## 2023-03-13 LAB — COMPLETE METABOLIC PANEL WITH GFR
AG Ratio: 1.2 (calc) (ref 1.0–2.5)
ALT: 14 U/L (ref 6–29)
AST: 18 U/L (ref 10–35)
Albumin: 4.2 g/dL (ref 3.6–5.1)
Alkaline phosphatase (APISO): 86 U/L (ref 37–153)
BUN: 16 mg/dL (ref 7–25)
CO2: 27 mmol/L (ref 20–32)
Calcium: 9.6 mg/dL (ref 8.6–10.4)
Chloride: 100 mmol/L (ref 98–110)
Creat: 0.86 mg/dL (ref 0.60–1.00)
Globulin: 3.5 g/dL (ref 1.9–3.7)
Glucose, Bld: 100 mg/dL — ABNORMAL HIGH (ref 65–99)
Potassium: 4 mmol/L (ref 3.5–5.3)
Sodium: 139 mmol/L (ref 135–146)
Total Bilirubin: 0.3 mg/dL (ref 0.2–1.2)
Total Protein: 7.7 g/dL (ref 6.1–8.1)
eGFR: 72 mL/min/{1.73_m2} (ref >=60)

## 2023-03-13 LAB — LIPID PANEL
Cholesterol: 203 mg/dL — ABNORMAL HIGH (ref <200)
HDL: 44 mg/dL — ABNORMAL LOW (ref >=50)
Non-HDL Cholesterol (Calc): 159 mg/dL — ABNORMAL HIGH (ref <130)
Total CHOL/HDL Ratio: 4.6 (calc) (ref <5.0)
Triglycerides: 429 mg/dL — ABNORMAL HIGH (ref <150)

## 2023-03-16 MED ORDER — FENOFIBRATE 160 MG PO TABS: 160.0000 mg | ORAL_TABLET | Freq: Every day | ORAL | 1 refills | Status: DC

## 2023-03-16 NOTE — Addendum Note (Signed)
 Addended by: Margarita Mail on: 03/16/2023 12:48 PM   Modules accepted: Orders

## 2023-04-28 ENCOUNTER — Ambulatory Visit: Payer: 59 | Admitting: Dermatology

## 2023-05-13 ENCOUNTER — Encounter: Payer: Self-pay | Admitting: Internal Medicine

## 2023-05-14 ENCOUNTER — Other Ambulatory Visit: Payer: Self-pay | Admitting: Internal Medicine

## 2023-05-14 DIAGNOSIS — F419 Anxiety disorder, unspecified: Secondary | ICD-10-CM

## 2023-05-25 NOTE — Telephone Encounter (Unsigned)
 Copied from CRM [id number]. Topic: Referral - Request for Referral >> May 25, 2023  8:18 AM Franchot Heidelberg wrote: Did the patient discuss referral with their provider in the last year? Yes (If No - schedule appointment) (If Yes - send message)  Appointment offered? No  Type of order/referral and detailed reason for visit: Therapy.  Needs paperwork for Emotional Support Assistance. She needs it for her pet at her apartment complex. Pt says she believes she was told by Dr. Caralee Ates that she would need this from another doctor. According to CMA, there is a referral already placed for this as of March 10th 2025 however nothing is showing in chart.  Preference of office, provider, location: [facility] [place]   If referral order, have you been seen by this specialty before? No (If Yes, this issue or another issue? When? Where?  Can we respond through MyChart? Yes

## 2023-07-15 ENCOUNTER — Encounter (HOSPITAL_COMMUNITY): Payer: Self-pay

## 2023-07-22 ENCOUNTER — Ambulatory Visit
Admission: RE | Admit: 2023-07-22 | Discharge: 2023-07-22 | Disposition: A | Discharge status: Home / Self Care | Source: Ambulatory Visit | Attending: Internal Medicine | Admitting: Internal Medicine

## 2023-07-22 DIAGNOSIS — Z1231 Encounter for screening mammogram for malignant neoplasm of breast: Secondary | ICD-10-CM | POA: Diagnosis not present

## 2023-07-29 ENCOUNTER — Ambulatory Visit: Payer: Self-pay | Admitting: Internal Medicine

## 2023-07-29 DIAGNOSIS — Z1211 Encounter for screening for malignant neoplasm of colon: Secondary | ICD-10-CM

## 2023-07-29 DIAGNOSIS — R195 Other fecal abnormalities: Secondary | ICD-10-CM

## 2023-08-05 DIAGNOSIS — M3501 Sicca syndrome with keratoconjunctivitis: Secondary | ICD-10-CM | POA: Diagnosis not present

## 2023-08-05 DIAGNOSIS — H43813 Vitreous degeneration, bilateral: Secondary | ICD-10-CM | POA: Diagnosis not present

## 2023-08-05 DIAGNOSIS — Z961 Presence of intraocular lens: Secondary | ICD-10-CM | POA: Diagnosis not present

## 2023-08-05 DIAGNOSIS — H40003 Preglaucoma, unspecified, bilateral: Secondary | ICD-10-CM | POA: Diagnosis not present

## 2023-08-20 DIAGNOSIS — C44191 Other specified malignant neoplasm of skin of unspecified eyelid, including canthus: Secondary | ICD-10-CM | POA: Diagnosis not present

## 2023-09-04 ENCOUNTER — Ambulatory Visit (INDEPENDENT_AMBULATORY_CARE_PROVIDER_SITE_OTHER)

## 2023-09-04 VITALS — BP 126/74 | Ht 60.0 in | Wt 160.0 lb

## 2023-09-04 DIAGNOSIS — Z2821 Immunization not carried out because of patient refusal: Secondary | ICD-10-CM

## 2023-09-04 DIAGNOSIS — Z5320 Procedure and treatment not carried out because of patient's decision for unspecified reasons: Secondary | ICD-10-CM

## 2023-09-04 DIAGNOSIS — Z0000 Encounter for general adult medical examination without abnormal findings: Secondary | ICD-10-CM | POA: Diagnosis not present

## 2023-09-04 NOTE — Patient Instructions (Signed)
 Ms. Simerson , Thank you for taking time out of your busy schedule to complete your Annual Wellness Visit with me. I enjoyed our conversation and look forward to speaking with you again next year. I, as well as your care team,  appreciate your ongoing commitment to your health goals. Please review the following plan we discussed and let me know if I can assist you in the future. Your Game plan/ To Do List    Referrals: If you haven't heard from the office you've been referred to, please reach out to them at the phone provided.  none Follow up Visits: Next Medicare AWV with our clinical staff: 09/17/2023   Have you seen your provider in the last 6 months (3 months if uncontrolled diabetes)? Yes Next Office Visit with your provider:   Clinician Recommendations:  Aim for 30 minutes of exercise or brisk walking, 6-8 glasses of water, and 5 servings of fruits and vegetables each day. 09/09/2023      This is a list of the screening recommended for you and due dates:  Health Maintenance  Topic Date Due   Zoster (Shingles) Vaccine (1 of 2) Never done   COVID-19 Vaccine (3 - Pfizer risk series) 06/30/2019   Cologuard (Stool DNA test)  01/14/2020   Flu Shot  10/09/2023   Medicare Annual Wellness Visit  09/03/2024   DTaP/Tdap/Td vaccine (2 - Tdap) 11/07/2024   Mammogram  07/21/2025   Pneumococcal Vaccine for age over 82  Completed   DEXA scan (bone density measurement)  Completed   Hepatitis C Screening  Completed   Hepatitis B Vaccine  Aged Out   HPV Vaccine  Aged Out   Meningitis B Vaccine  Aged Out    Advanced directives: (Copy Requested) Please bring a copy of your health care power of attorney and living will to the office to be added to your chart at your convenience. You can mail to [facility] [address] [place], [place] [address] or email to [email or web address] Advance Care Planning is important because it:  [x]  Makes sure you receive the medical care that  is consistent with your values, goals, and preferences  [x]  It provides guidance to your family and loved ones and reduces their decisional burden about whether or not they are making the right decisions based on your wishes.  Follow the link provided in your after visit summary or read over the paperwork we have mailed to you to help you started getting your Advance Directives in place. If you need assistance in completing these, please reach out to us  so that we can help you!  See attachments for Preventive Care and Fall Prevention Tips.

## 2023-09-04 NOTE — Progress Notes (Signed)
 Because this visit was a virtual/telehealth visit,  certain criteria was not obtained, such a blood pressure, CBG if applicable, and timed get up and go. Any medications not marked as taking were not mentioned during the medication reconciliation part of the visit. Any vitals not documented were not able to be obtained due to this being a telehealth visit or patient was unable to self-report a recent blood pressure reading due to a lack of equipment at home via telehealth. Vitals that have been documented are verbally provided by the patient.   This visit was performed by a medical professional under my direct supervision. I was immediately available for consultation/collaboration. I have reviewed and agree with the Annual Wellness Visit documentation.  Subjective:   Vanessa Casey is a 73 y.o. who presents for a Medicare Wellness preventive visit.  As a reminder, Annual Wellness Visits don't include a physical exam, and some assessments may be limited, especially if this visit is performed virtually. We may recommend an in-person follow-up visit with your provider if needed.  Visit Complete: Virtual I connected with  Vanessa Casey on 09/04/23 by a audio enabled telemedicine application and verified that I am speaking with the correct person using two identifiers.  Patient Location: Home  Provider Location: Home Office  I discussed the limitations of evaluation and management by telemedicine. The patient expressed understanding and agreed to proceed.  Vital Signs: Because this visit was a virtual/telehealth visit, some criteria may be missing or patient reported. Any vitals not documented were not able to be obtained and vitals that have been documented are patient reported.  VideoDeclined- This patient declined Librarian, academic. Therefore the visit was completed with audio only.  Persons Participating in Visit: Patient.  AWV Questionnaire: No: Patient Medicare  AWV questionnaire was not completed prior to this visit.  Cardiac Risk Factors include: advanced age (>95men, >22 women);obesity (BMI >30kg/m2);hypertension;dyslipidemia     Objective:    Today's Vitals   09/04/23 1106  BP: 126/74  Weight: 160 lb (72.6 kg)  Height: 5' (1.524 m)   Body mass index is 31.25 kg/m.     09/04/2023   11:05 AM 08/29/2022   10:06 AM 04/29/2021   11:23 AM 04/27/2020    1:44 PM 04/21/2019    3:45 PM 03/03/2018    7:44 PM 01/14/2018    3:44 PM  Advanced Directives  Does Patient Have a Medical Advance Directive? No No No No No No  No   Would patient like information on creating a medical advance directive? No - Patient declined     No - Patient declined  Yes (MAU/Ambulatory/Procedural Areas - Information given)      Data saved with a previous flowsheet row definition    Current Medications (verified) Outpatient Encounter Medications as of 09/04/2023  Medication Sig   celecoxib  (CELEBREX ) 50 MG capsule TAKE 1 CAPSULE(50 MG) BY MOUTH DAILY AS NEEDED FOR PAIN   fenofibrate  160 MG tablet Take 1 tablet (160 mg total) by mouth daily.   hydrochlorothiazide  (MICROZIDE ) 12.5 MG capsule Take 1 capsule (12.5 mg total) by mouth daily.   No facility-administered encounter medications on file as of 09/04/2023.    Allergies (verified) Lisinopril  and Losartan    History: Past Medical History:  Diagnosis Date   Acute cholecystitis 03/04/2018   Atypical mole 07/30/2017   left breast/excision   Basal cell carcinoma 07/30/2017   right jaw   Basal cell carcinoma 09/16/2017   left upper arm/excision   Basal cell carcinoma  11/22/2018   right upper cutaneous lip   Basal cell carcinoma 09/23/2022   left upper lip near nasolabial fold/refer for Mohs [facility]   BCC (basal cell carcinoma of skin) 11/07/2021   upper mid forehead (Nodular)  clear wih bx 09/23/22   BCC (basal cell carcinoma of skin) 11/07/2021   left upper forehead at hairline (superficial)  clear with bx  09/23/22   Edema    FEET/ LEGS   Hypertension    Medical history non-contributory    Post-operative nausea and vomiting 04/30/2017   Shingles    Past Surgical History:  Procedure Laterality Date   BASAL CELL CARCINOMA EXCISION     CATARACT EXTRACTION W/PHACO Right 05/05/2017   Procedure: CATARACT EXTRACTION PHACO AND INTRAOCULAR LENS PLACEMENT (IOC);  Surgeon: Jaye Fallow, MD;  Location: [facility] ORS;  Service: Ophthalmology;  Laterality: Right;  US  00:33.2 AP% 15.8 CDE 5.25 Fluid Pack Lot # [id number]   CATARACT EXTRACTION W/PHACO Left 06/09/2017   Procedure: CATARACT EXTRACTION PHACO AND INTRAOCULAR LENS PLACEMENT (IOC);  Surgeon: Jaye Fallow, MD;  Location: [facility] OR[facility];  Service: Ophthalmology;  Laterality: Left;  US  01:02.2 AP% 15.2 CDE 9.48 Fluid Pack Lot # [id number] H   CHOLECYSTECTOMY N/A 03/04/2018   Procedure: LAPAROSCOPIC CHOLECYSTECTOMY;  Surgeon: Jordis Laneta FALCON, MD;  Location: [facility] ORS;  Service: General;  Laterality: N/A;   TOTAL ABDOMINAL HYSTERECTOMY     Family History  Problem Relation Age of Onset   Dementia Mother    Heart failure Father    Heart attack Sister    Heart attack Brother    Breast cancer Maternal Aunt    Social History   Socioeconomic History   Marital status: Single    Spouse name: Not on file   Number of children: Not on file   Years of education: Not on file   Highest education level: Not on file  Occupational History   Not on file  Tobacco Use   Smoking status: Never   Smokeless tobacco: Never  Vaping Use   Vaping status: Never Used  Substance and Sexual Activity   Alcohol use: No   Drug use: No   Sexual activity: Not Currently    Birth control/protection: Post-menopausal  Other Topics Concern   Not on file  Social History Narrative   Not on file   Social Drivers of Health   Financial Resource Strain: Low Risk  (09/04/2023)   Overall Financial Resource Strain (CARDIA)    Difficulty of Paying Living Expenses: Not hard at  all  Food Insecurity: No Food Insecurity (09/04/2023)   Hunger Vital Sign    Worried About Running Out of Food in the Last Year: Never true    Ran Out of Food in the Last Year: Never true  Transportation Needs: No Transportation Needs (09/04/2023)   PRAPARE - Administrator, Civil Service (Medical): No    Lack of Transportation (Non-Medical): No  Physical Activity: Sufficiently Active (09/04/2023)   Exercise Vital Sign    Days of Exercise per Week: 7 days    Minutes of Exercise per Session: 30 min  Stress: No Stress Concern Present (09/04/2023)   [employer] of Occupational Health - Occupational Stress Questionnaire    Feeling of Stress: Not at all  Social Connections: Socially Isolated (09/04/2023)   Social Connection and Isolation Panel    Frequency of Communication with Friends and Family: More than three times a week    Frequency of Social Gatherings with Friends and Family: More than three  times a week    Attends Religious Services: Never    Active Member of Clubs or Organizations: No    Attends Banker Meetings: Never    Marital Status: Widowed    Tobacco Counseling Counseling given: Not Answered    Clinical Intake:  Pre-visit preparation completed: Yes  Pain : No/denies pain     BMI - recorded: 31.25 Nutritional Status: BMI > 30  Obese Nutritional Risks: None Diabetes: No  Lab Results  Component Value Date   HGBA1C 5.7 (H) 12/11/2016     How often do you need to have someone help you when you read instructions, pamphlets, or other written materials from your doctor or pharmacy?: 1 - Never What is the last grade level you completed in school?: GED     Information entered by :: [employer]   Activities of Daily Living     09/04/2023   11:08 AM 09/09/2022    9:59 AM  In your present state of health, do you have any difficulty performing the following activities:  Hearing? 0 0  Vision? 0 0  Difficulty concentrating or  making decisions? 0 0  Walking or climbing stairs? 0 0  Dressing or bathing? 0 0  Doing errands, shopping? 0 0  Preparing Food and eating ? N   Using the Toilet? N   In the past six months, have you accidently leaked urine? N   Do you have problems with loss of bowel control? N   Managing your Medications? N   Managing your Finances? N   Housekeeping or managing your Housekeeping? N     Patient Care Team: Bernardo Fend, DO as PCP - General (Internal Medicine) Mevelyn JONETTA Bathe, OD (Optometry)  I have updated your Care Teams any recent Medical Services you may have received from other providers in the past year.     Assessment:   This is a routine wellness examination for Vanessa Casey.  Hearing/Vision screen Hearing Screening - Comments:: Patient does not have hearing issues  Vision Screening - Comments:: Patient wears otc readers   Goals Addressed             This Visit's Progress    Patient Stated   On track    04/27/2020, stay healthy       Depression Screen     09/04/2023   11:10 AM 09/09/2022    9:59 AM 08/29/2022   10:03 AM 06/10/2022   10:12 AM 05/05/2022    1:42 PM 02/03/2022    8:52 AM 12/31/2021   10:52 AM  PHQ 2/9 Scores  PHQ - 2 Score 0 0 0 0 0 0 0  PHQ- 9 Score 0 0  0 0 0 0    Fall Risk     09/04/2023   11:08 AM 03/12/2023    9:50 AM 09/09/2022    9:57 AM 08/29/2022    9:57 AM 06/10/2022   10:10 AM  Fall Risk   Falls in the past year? 0 0 0 0 0  Number falls in past yr: 0 0 0 0 0  Injury with Fall? 0 0 0 0 0  Risk for fall due to : No Fall Risks No Fall Risks  No Fall Risks   Follow up Falls evaluation completed Falls prevention discussed;Education provided;Falls evaluation completed  Falls prevention discussed;Education provided     MEDICARE RISK AT HOME:  Medicare Risk at Home Any stairs in or around the home?: No If so, are there any without  handrails?: No Home free of loose throw rugs in walkways, pet beds, electrical cords, etc?: Yes Adequate  lighting in your home to reduce risk of falls?: Yes Life alert?: No Use of a cane, walker or w/c?: No Grab bars in the bathroom?: No Shower chair or bench in shower?: No Elevated toilet seat or a handicapped toilet?: No  TIMED UP AND GO:  Was the test performed?  No  Cognitive Function: 6CIT completed        09/04/2023   11:07 AM 08/29/2022   10:07 AM 04/27/2020    1:46 PM 03/17/2019    3:32 PM 01/14/2018    3:45 PM  6CIT Screen  What Year? 0 points 0 points 0 points 0 points 0 points  What month? 0 points 0 points 0 points 0 points 0 points  What time? 0 points 0 points 0 points 0 points 0 points  Count back from 20 0 points 0 points 0 points 2 points 0 points  Months in reverse 0 points 0 points 0 points 0 points 0 points  Repeat phrase 0 points 0 points 0 points 2 points 0 points  Total Score 0 points 0 points 0 points 4 points 0 points    Immunizations Immunization History  Administered Date(s) Administered   Fluad Quad(high Dose 65+) 12/03/2018, 12/03/2021   Fluad Trivalent(High Dose 65+) 03/12/2023   Influenza, High Dose Seasonal PF 12/16/2017   Influenza,inj,Quad PF,6+ Mos 12/11/2016   Influenza-Unspecified 12/18/2020   PFIZER(Purple Top)SARS-COV-2 Vaccination 05/12/2019, 06/02/2019   PNEUMOCOCCAL CONJUGATE-20 09/09/2022   Pneumococcal Conjugate-13 01/12/2017   Pneumococcal Polysaccharide-23 01/14/2018   Td 11/08/2014    Screening Tests Health Maintenance  Topic Date Due   Zoster Vaccines- Shingrix (1 of 2) Never done   COVID-19 Vaccine (3 - Pfizer risk series) 06/30/2019   Fecal DNA (Cologuard)  01/14/2020   INFLUENZA VACCINE  10/09/2023   Medicare Annual Wellness (AWV)  09/03/2024   DTaP/Tdap/Td (2 - Tdap) 11/07/2024   MAMMOGRAM  07/21/2025   Pneumococcal Vaccine: 50+ Years  Completed   DEXA SCAN  Completed   Hepatitis C Screening  Completed   Hepatitis B Vaccines  Aged Out   HPV VACCINES  Aged Out   Meningococcal B Vaccine  Aged Out    Health  Maintenance  Health Maintenance Due  Topic Date Due   Zoster Vaccines- Shingrix (1 of 2) Never done   COVID-19 Vaccine (3 - Pfizer risk series) 06/30/2019   Fecal DNA (Cologuard)  01/14/2020   Health Maintenance Items Addressed:   Additional Screening:  Vision Screening: Recommended annual ophthalmology exams for early detection of glaucoma and other disorders of the eye. Would you like a referral to an eye doctor? No    Dental Screening: Recommended annual dental exams for proper oral hygiene  Community Resource Referral / Chronic Care Management: CRR required this visit?  No   CCM required this visit?  No   Plan:    I have personally reviewed and noted the following in the patient's chart:   Medical and social history Use of alcohol, tobacco or illicit drugs  Current medications and supplements including opioid prescriptions. Patient is not currently taking opioid prescriptions. Functional ability and status Nutritional status Physical activity Advanced directives List of other physicians Hospitalizations, surgeries, and ER visits in previous 12 months Vitals Screenings to include cognitive, depression, and falls Referrals and appointments  In addition, I have reviewed and discussed with patient certain preventive protocols, quality metrics, and best practice recommendations. A  written personalized care plan for preventive services as well as general preventive health recommendations were provided to patient.   Vanessa Casey Right, [place]   09/04/2023   After Visit Summary: (MyChart) Due to this being a telephonic visit, the after visit summary with patients personalized plan was offered to patient via MyChart   Notes: Nothing significant to report at this time.

## 2023-09-06 ENCOUNTER — Other Ambulatory Visit: Payer: Self-pay | Admitting: Internal Medicine

## 2023-09-06 DIAGNOSIS — I10 Essential (primary) hypertension: Secondary | ICD-10-CM

## 2023-09-07 ENCOUNTER — Other Ambulatory Visit: Payer: Self-pay | Admitting: Internal Medicine

## 2023-09-07 DIAGNOSIS — E781 Pure hyperglyceridemia: Secondary | ICD-10-CM

## 2023-09-08 NOTE — Telephone Encounter (Signed)
 discontinued on 03/16/2023 by Bernardo Fend, DO for the following reason: Dose change  Requested Prescriptions  Refused Prescriptions Disp Refills   fenofibrate  (TRICOR ) 48 MG tablet [Pharmacy Med Name: FENOFIBRATE  48MG  TABLETS] 90 tablet 1    Sig: TAKE 1 TABLET(48 MG) BY MOUTH DAILY     There is no refill protocol information for this order

## 2023-09-08 NOTE — Telephone Encounter (Signed)
 Requested Prescriptions  Pending Prescriptions Disp Refills   hydrochlorothiazide  (MICROZIDE ) 12.5 MG capsule [Pharmacy Med Name: HYDROCHLOROTHIAZIDE  12.5MG  CAPSULES] 90 capsule 0    Sig: TAKE 1 CAPSULE(12.5 MG) BY MOUTH DAILY     Cardiovascular: Diuretics - Thiazide Passed - 09/08/2023 12:24 PM      Passed - Cr in normal range and within 180 days    Creat  Date Value Ref Range Status  03/12/2023 0.86 0.60 - 1.00 mg/dL Final         Passed - K in normal range and within 180 days    Potassium  Date Value Ref Range Status  03/12/2023 4.0 3.5 - 5.3 mmol/L Final         Passed - Na in normal range and within 180 days    Sodium  Date Value Ref Range Status  03/12/2023 139 135 - 146 mmol/L Final  12/31/2021 142 134 - 144 mmol/L Final         Passed - Last BP in normal range    BP Readings from Last 1 Encounters:  09/04/23 126/74         Passed - Valid encounter within last 6 months    Recent Outpatient Visits   None     Future Appointments             Tomorrow Bernardo Fend, DO [facility], [facility]

## 2023-09-09 ENCOUNTER — Other Ambulatory Visit: Payer: Self-pay

## 2023-09-09 ENCOUNTER — Ambulatory Visit (INDEPENDENT_AMBULATORY_CARE_PROVIDER_SITE_OTHER): Payer: Self-pay | Admitting: Internal Medicine

## 2023-09-09 ENCOUNTER — Encounter: Payer: Self-pay | Admitting: Internal Medicine

## 2023-09-09 VITALS — BP 120/72 | HR 82 | Temp 97.9°F | Resp 16 | Ht 60.0 in | Wt 165.6 lb

## 2023-09-09 DIAGNOSIS — E669 Obesity, unspecified: Secondary | ICD-10-CM

## 2023-09-09 DIAGNOSIS — R0789 Other chest pain: Secondary | ICD-10-CM | POA: Diagnosis not present

## 2023-09-09 DIAGNOSIS — E781 Pure hyperglyceridemia: Secondary | ICD-10-CM

## 2023-09-09 DIAGNOSIS — I10 Essential (primary) hypertension: Secondary | ICD-10-CM

## 2023-09-09 DIAGNOSIS — R29898 Other symptoms and signs involving the musculoskeletal system: Secondary | ICD-10-CM

## 2023-09-09 DIAGNOSIS — M1990 Unspecified osteoarthritis, unspecified site: Secondary | ICD-10-CM

## 2023-09-09 MED ORDER — FENOFIBRATE 160 MG PO TABS: 160.0000 mg | ORAL_TABLET | Freq: Every day | ORAL | 1 refills | Status: DC

## 2023-09-09 MED ORDER — CELECOXIB 50 MG PO CAPS: 50.0000 mg | ORAL_CAPSULE | Freq: Every day | ORAL | 0 refills | Status: DC | PRN

## 2023-09-09 MED ORDER — HYDROCHLOROTHIAZIDE 12.5 MG PO CAPS: 12.5000 mg | ORAL_CAPSULE | Freq: Every day | ORAL | 1 refills | Status: DC

## 2023-09-09 NOTE — Progress Notes (Signed)
 Established Patient Office Visit  Subjective   Patient ID: Vanessa Casey, female    DOB: 11/02/1950  Age: 73 y.o. MRN: [id number]  Chief Complaint  Patient presents with   Medical Management of Chronic Issues    6 month recheck    HPI  Patient is here for follow up on chronic medical conditions.   Discussed the use of AI scribe software for clinical note transcription with the patient, who gave verbal consent to proceed.  History of Present Illness Vanessa Casey is a 73 year old female who presents for follow-up on elevated triglyceride levels.  Triglyceride levels have been progressively increasing over the past five years, with a recent measurement of 429 mg/dL. She is prescribed fenofibrate  but uses it inconsistently.  She experiences occasional chest pain between her chest and arm but has not evaluated this with a cardiologist due to the relocation of the referred provider.  She is on hydrochlorothiazide  for hypertension, which she takes regularly, maintaining good blood pressure control.  Joint pain occurs particularly in her legs at night. She was previously prescribed Celebrex , which she finds effective, taking it approximately twice a week as needed, but is currently out of the medication.  She reports new weakness in her right arm, especially when holding heavy objects, without associated pain, numbness, or tingling. The weakness occurs mostly during lifting or holding.  She experiences caregiver stress, currently taking care of her sister and previously her mother, impacting her ability to focus on her health needs.   Hypertension: -Medications: hydrochlorothiazide  12.5 mg  -Checking BP at home (average): 120-130/70-80 -Denies any SOB, vision changes, LE edema or symptoms of hypotension. Still having occasional chest discomfort that will resolve after a few minutes but was not seen by Cardiology yet.    History of multiple basal cell carcinomas removed: -Had a BCC  removed September 2023 on her face and forehead but recently had an eye surgery and was found to have another BCC removed on her eyelid last month -Following with dermatology on a yearly basis  Hypertriglyceridemia: -Tricor  increased to 160 mg previously but states she is not taking it, never been on a statin -Last lipid panel: Lipid Panel     Component Value Date/Time   CHOL 203 (H) 03/12/2023 1052   CHOL 197 12/03/2021 1046   TRIG 429 (H) 03/12/2023 1052   HDL 44 (L) 03/12/2023 1052   HDL 41 12/03/2021 1046   CHOLHDL 4.6 03/12/2023 1052   LDLCALC  03/12/2023 1052     Comment:     . LDL cholesterol not calculated. Triglyceride levels greater than 400 mg/dL invalidate calculated LDL results. . Reference range: <100 . Desirable range <100 mg/dL for primary prevention;   <70 mg/dL for patients with CHD or diabetic patients  with > or = 2 CHD risk factors. SABRA LDL-C is now calculated using the Martin-Hopkins  calculation, which is a validated novel method providing  better accuracy than the Friedewald equation in the  estimation of LDL-C.  Gladis APPLETHWAITE et al. SANDREA. 7986;689(80): 2061-2068  (http://education.[facility].com/faq/FAQ164)    LABVLDL 61 (H) 12/03/2021 1046    Health Maintenance: -Blood work UTD -Mammogram 10/23 Birads-1, mammogram ordered for this year -Colon cancer screening due - cologuard ordered previously but no results in chart   Patient Active Problem List   Diagnosis Date Noted   Mixed hyperlipidemia 07/17/2020   HTN (hypertension) 04/24/2019   Post-operative nausea and vomiting 04/30/2017   Advance directive discussed with patient 01/12/2017   Past  Medical History:  Diagnosis Date   Acute cholecystitis 03/04/2018   Atypical mole 07/30/2017   left breast/excision   Basal cell carcinoma 07/30/2017   right jaw   Basal cell carcinoma 09/16/2017   left upper arm/excision   Basal cell carcinoma 11/22/2018   right upper cutaneous lip   Basal cell  carcinoma 09/23/2022   left upper lip near nasolabial fold/refer for Mohs [facility]   BCC (basal cell carcinoma of skin) 11/07/2021   upper mid forehead (Nodular)  clear wih bx 09/23/22   BCC (basal cell carcinoma of skin) 11/07/2021   left upper forehead at hairline (superficial)  clear with bx 09/23/22   Edema    FEET/ LEGS   Hypertension    Medical history non-contributory    Post-operative nausea and vomiting 04/30/2017   Shingles    Past Surgical History:  Procedure Laterality Date   BASAL CELL CARCINOMA EXCISION     CATARACT EXTRACTION W/PHACO Right 05/05/2017   Procedure: CATARACT EXTRACTION PHACO AND INTRAOCULAR LENS PLACEMENT (IOC);  Surgeon: Jaye Fallow, MD;  Location: [facility] ORS;  Service: Ophthalmology;  Laterality: Right;  US  00:33.2 AP% 15.8 CDE 5.25 Fluid Pack Lot # [id number]   CATARACT EXTRACTION W/PHACO Left 06/09/2017   Procedure: CATARACT EXTRACTION PHACO AND INTRAOCULAR LENS PLACEMENT (IOC);  Surgeon: Jaye Fallow, MD;  Location: [facility] ORS;  Service: Ophthalmology;  Laterality: Left;  US  01:02.2 AP% 15.2 CDE 9.48 Fluid Pack Lot # [id number]   CHOLECYSTECTOMY N/A 03/04/2018   Procedure: LAPAROSCOPIC CHOLECYSTECTOMY;  Surgeon: Jordis Laneta FALCON, MD;  Location: [facility] ORS;  Service: General;  Laterality: N/A;   TOTAL ABDOMINAL HYSTERECTOMY     Social History   Tobacco Use   Smoking status: Never   Smokeless tobacco: Never  Vaping Use   Vaping status: Never Used  Substance Use Topics   Alcohol use: No   Drug use: No   Social History   Socioeconomic History   Marital status: Single    Spouse name: Not on file   Number of children: Not on file   Years of education: Not on file   Highest education level: Not on file  Occupational History   Not on file  Tobacco Use   Smoking status: Never   Smokeless tobacco: Never  Vaping Use   Vaping status: Never Used  Substance and Sexual Activity   Alcohol use: No   Drug use: No   Sexual activity: Not Currently     Birth control/protection: Post-menopausal  Other Topics Concern   Not on file  Social History Narrative   Not on file   Social Drivers of Health   Financial Resource Strain: Low Risk  (09/04/2023)   Overall Financial Resource Strain (CARDIA)    Difficulty of Paying Living Expenses: Not hard at all  Food Insecurity: No Food Insecurity (09/04/2023)   Hunger Vital Sign    Worried About Running Out of Food in the Last Year: Never true    Ran Out of Food in the Last Year: Never true  Transportation Needs: No Transportation Needs (09/04/2023)   PRAPARE - Administrator, Civil Service (Medical): No    Lack of Transportation (Non-Medical): No  Physical Activity: Sufficiently Active (09/04/2023)   Exercise Vital Sign    Days of Exercise per Week: 7 days    Minutes of Exercise per Session: 30 min  Stress: No Stress Concern Present (09/04/2023)   [employer] of Occupational Health - Occupational Stress Questionnaire    Feeling of Stress:  Not at all  Social Connections: Socially Isolated (09/04/2023)   Social Connection and Isolation Panel    Frequency of Communication with Friends and Family: More than three times a week    Frequency of Social Gatherings with Friends and Family: More than three times a week    Attends Religious Services: Never    Database administrator or Organizations: No    Attends Banker Meetings: Never    Marital Status: Widowed  Intimate Partner Violence: Not At Risk (09/04/2023)   Humiliation, Afraid, Rape, and Kick questionnaire    Fear of Current or Ex-Partner: No    Emotionally Abused: No    Physically Abused: No    Sexually Abused: No   Family Status  Relation Name Status   Mother  (Not Specified)   Father  (Not Specified)   Sister  (Not Specified)   Brother  (Not Specified)   Mat Aunt  (Not Specified)  No partnership data on file   Family History  Problem Relation Age of Onset   Dementia Mother    Heart failure Father     Heart attack Sister    Heart attack Brother    Breast cancer Maternal Aunt    Allergies  Allergen Reactions   Lisinopril  Cough   Losartan  Other (See Comments)    edema    Review of Systems  Constitutional:  Negative for chills and fever.  Eyes:  Negative for blurred vision.  Respiratory:  Negative for shortness of breath.   Cardiovascular:  Positive for chest pain. Negative for palpitations and leg swelling.  Neurological:  Positive for weakness.      Objective:     BP 120/72 (Cuff Size: Large)   Pulse 82   Temp 97.9 F (36.6 C) (Oral)   Resp 16   Ht 5' (1.524 m)   Wt 165 lb 9.6 oz (75.1 kg)   SpO2 96%   BMI 32.34 kg/m  BP Readings from Last 3 Encounters:  09/09/23 120/72  09/04/23 126/74  03/12/23 128/80   Wt Readings from Last 3 Encounters:  09/09/23 165 lb 9.6 oz (75.1 kg)  09/04/23 160 lb (72.6 kg)  03/12/23 165 lb 6.4 oz (75 kg)      Physical Exam Constitutional:      Appearance: Normal appearance.  HENT:     Head: Normocephalic and atraumatic.  Eyes:     Conjunctiva/sclera: Conjunctivae normal.  Cardiovascular:     Rate and Rhythm: Normal rate and regular rhythm.  Pulmonary:     Effort: Pulmonary effort is normal.     Breath sounds: Normal breath sounds.  Skin:    General: Skin is warm and dry.  Neurological:     General: No focal deficit present.     Mental Status: She is alert. Mental status is at baseline.  Psychiatric:        Mood and Affect: Mood normal.        Behavior: Behavior normal.      No results found for any visits on 09/09/23.  Last CBC Lab Results  Component Value Date   WBC 8.0 03/12/2023   HGB 12.9 03/12/2023   HCT 38.9 03/12/2023   MCV 84.0 03/12/2023   MCH 27.9 03/12/2023   RDW 13.6 03/12/2023   PLT 264 03/12/2023   Last metabolic panel Lab Results  Component Value Date   GLUCOSE 100 (H) 03/12/2023   NA 139 03/12/2023   K 4.0 03/12/2023   CL 100 03/12/2023  CO2 27 03/12/2023   BUN 16 03/12/2023    CREATININE 0.86 03/12/2023   EGFR 72 03/12/2023   CALCIUM 9.6 03/12/2023   PROT 7.7 03/12/2023   ALBUMIN 4.2 12/03/2021   LABGLOB 2.8 12/03/2021   AGRATIO 1.5 12/03/2021   BILITOT 0.3 03/12/2023   ALKPHOS 91 12/03/2021   AST 18 03/12/2023   ALT 14 03/12/2023   ANIONGAP 8 03/04/2018   Last lipids Lab Results  Component Value Date   CHOL 203 (H) 03/12/2023   HDL 44 (L) 03/12/2023   LDLCALC  03/12/2023     Comment:     . LDL cholesterol not calculated. Triglyceride levels greater than 400 mg/dL invalidate calculated LDL results. . Reference range: <100 . Desirable range <100 mg/dL for primary prevention;   <70 mg/dL for patients with CHD or diabetic patients  with > or = 2 CHD risk factors. SABRA LDL-C is now calculated using the Martin-Hopkins  calculation, which is a validated novel method providing  better accuracy than the Friedewald equation in the  estimation of LDL-C.  Gladis APPLETHWAITE et al. SANDREA. 7986;689(80): 2061-2068  (http://education.[facility].com/faq/FAQ164)    TRIG 429 (H) 03/12/2023   CHOLHDL 4.6 03/12/2023   Last hemoglobin A1c Lab Results  Component Value Date   HGBA1C 5.7 (H) 12/11/2016   Last thyroid  functions Lab Results  Component Value Date   TSH 1.930 12/03/2021   Last vitamin D No results found for: 25OHVITD2, 25OHVITD3, VD25OH Last vitamin B12 and Folate No results found for: VITAMINB12, FOLATE    The 10-year ASCVD risk score (Arnett DK, et al., 2019) is: 12.8%    Assessment & Plan:   Assessment & Plan Chest Pain Intermittent chest pain requires cardiology evaluation. - Refer to a cardiologist in [place].  Hypertriglyceridemia Triglycerides elevated at 429 mg/dL. Inconsistent fenofibrate  use noted. - Refill fenofibrate  prescription and instruct daily use. - Schedule follow-up in six months for cholesterol recheck.  Hypertension Blood pressure controlled at 120/72 mmHg with hydrochlorothiazide . - Refill  hydrochlorothiazide  prescription.  Arthritis Intermittent joint pain managed with as-needed Celebrex . - Refill Celebrex  prescription for as-needed use, advise taking with food.  Muscle Weakness Intermittent right biceps weakness likely due to overuse. - Recommend biceps strengthening exercises with low weight, high reps.  Weight Management Discussed weight loss medications, contingent on insurance coverage. - Instruct to check insurance coverage for [facility] or Zepbound.  General Health Maintenance Cologuard test results not received. Tetanus vaccine due next year. - Reorder Cologuard test after one year.  Follow-up Plans for monitoring and reassessment of conditions. - Schedule follow-up in six months with fasting for lab tests.  - hydrochlorothiazide  (MICROZIDE ) 12.5 MG capsule; Take 1 capsule (12.5 mg total) by mouth daily.  Dispense: 90 capsule; Refill: 1 - fenofibrate  160 MG tablet; Take 1 tablet (160 mg total) by mouth daily.  Dispense: 90 tablet; Refill: 1 - celecoxib  (CELEBREX ) 50 MG capsule; Take 1 capsule (50 mg total) by mouth daily as needed for pain.  Dispense: 90 capsule; Refill: 0 - Ambulatory referral to Cardiology   Return in about 6 months (around 03/11/2024).    Sharyle Fischer, DO

## 2023-09-17 ENCOUNTER — Ambulatory Visit: Admitting: Dermatology

## 2023-10-05 ENCOUNTER — Ambulatory Visit: Attending: Cardiology | Admitting: Cardiology

## 2023-10-05 ENCOUNTER — Encounter: Payer: Self-pay | Admitting: Cardiology

## 2023-10-05 VITALS — BP 124/63 | HR 69 | Ht 60.0 in | Wt 165.6 lb

## 2023-10-05 DIAGNOSIS — E781 Pure hyperglyceridemia: Secondary | ICD-10-CM

## 2023-10-05 DIAGNOSIS — I10 Essential (primary) hypertension: Secondary | ICD-10-CM | POA: Diagnosis not present

## 2023-10-05 DIAGNOSIS — E782 Mixed hyperlipidemia: Secondary | ICD-10-CM | POA: Diagnosis not present

## 2023-10-05 DIAGNOSIS — R079 Chest pain, unspecified: Secondary | ICD-10-CM | POA: Diagnosis not present

## 2023-10-05 MED ORDER — METOPROLOL TARTRATE 100 MG PO TABS
100.0000 mg | ORAL_TABLET | Freq: Once | ORAL | 0 refills | Status: DC
Start: 2023-10-05 — End: 2024-01-15

## 2023-10-05 NOTE — Patient Instructions (Signed)
 Medication Instructions:  Your physician recommends that you continue on your current medications as directed. Please refer to the Current Medication list given to you today.   *If you need a refill on your cardiac medications before your next appointment, please call your pharmacy*  Lab Work: Your provider would like for you to have following labs drawn today CBC, BMP.   If you have labs (blood work) drawn today and your tests are completely normal, you will receive your results only by: MyChart Message (if you have MyChart) OR A paper copy in the mail If you have any lab test that is abnormal or we need to change your treatment, we will call you to review the results.  Testing/Procedures: Your physician has requested that you have an echocardiogram. Echocardiography is a painless test that uses sound waves to create images of your heart. It provides your doctor with information about the size and shape of your heart and how well your heart's chambers and valves are working.   You may receive an ultrasound enhancing agent through an IV if needed to better visualize your heart during the echo. This procedure takes approximately one hour.  There are no restrictions for this procedure.  This will take place at [address] [facility] [address] [facility], [place] [address]  Please note: We ask at that you not bring children with you during ultrasound (echo/ vascular) testing. Due to room size and safety concerns, children are not allowed in the ultrasound rooms during exams. Our front office staff cannot provide observation of children in our lobby area while testing is being conducted. An adult accompanying a patient to their appointment will only be allowed in the ultrasound room at the discretion of the ultrasound technician under special circumstances. We apologize for any inconvenience.     Your cardiac CT will be scheduled at one of the below locations:   [facility] [address] [place], [place] [address] [phone]   If scheduled at [facility] or [facility], please arrive 15 mins early for check-in and test prep.  There is spacious parking and easy access to the radiology department from the [facility] V[facility] entrance. Please enter here and check-in with the desk attendant.   Please follow these instructions carefully (unless otherwise directed):  An IV will be required for this test and Nitroglycerin will be given.  Hold all erectile dysfunction medications at least 3 days (72 hrs) prior to test. (Ie viagra, cialis, sildenafil, tadalafil, etc)   On the Night Before the Test: Be sure to Drink plenty of water. Do not consume any caffeinated/decaffeinated beverages or chocolate 12 hours prior to your test. Do not take any antihistamines 12 hours prior to your test.  On the Day of the Test: Drink plenty of water until 1 hour prior to the test. Do not eat any food 1 hour prior to test. You may take your regular medications prior to the test.  Take metoprolol  (Lopressor ) two hours prior to test. If you take Furosemide/Hydrochlorothiazide /Spironolactone/Chlorthalidone, please HOLD on the morning of the test. Patients who wear a continuous glucose monitor MUST remove the device prior to scanning. FEMALES- please wear underwire-free bra if available, avoid dresses & tight clothing       After the Test: Drink plenty of water. After receiving IV contrast, you may experience a mild flushed feeling. This is normal. On occasion, you may experience a mild rash up to 24  hours after the test. This is not dangerous. If this occurs, you can take Benadryl  25 mg, Zyrtec, Claritin, or Allegra and increase your fluid intake. (Patients taking Tikosyn should avoid Benadryl , and may take Zyrtec, Claritin, or Allegra) If you experience trouble breathing, this can be serious. If it is severe call 911  IMMEDIATELY. If it is mild, please call our office.  We will call to schedule your test 2-4 weeks out understanding that some insurance companies will need an authorization prior to the service being performed.   For more information and frequently asked questions, please visit our website : [email or web address]  For non-scheduling related questions, please contact the cardiac imaging nurse navigator should you have any questions/concerns: Cardiac Imaging Nurse Navigators Direct Office Dial: [phone]   For scheduling needs, including cancellations and rescheduling, please call [place], [phone].   Follow-Up: At [facility], you and your health needs are our priority.  As part of our continuing mission to provide you with exceptional heart care, our providers are all part of one team.  This team includes your primary Cardiologist (physician) and Advanced Practice Providers or APPs (Physician Assistants and Nurse Practitioners) who all work together to provide you with the care you need, when you need it.  Your next appointment:   8 week(s)  Provider:   Tylene Lunch, NP

## 2023-10-05 NOTE — Progress Notes (Signed)
 Cardiology Office Note   Date:  10/05/2023  ID:  Vanessa Casey, DOB [birth year], MRN [id number] PCP: Bernardo Fend, DO  [facility] Providers Cardiologist:  None Cardiology APP:  Gerard Frederick, NP     History of Present Illness Vanessa Casey is a 73 y.o. female with a past medical history of mixed hyperlipidemia, hypertension, history of multiple basal cell carcinomas, hypertriglyceridemia, who is being seen today on referral from her PCP with complaints of occasional chest discomfort.   Patient was recently evaluated by her PCP on 09/09/2023 following up on her chronic medical conditions.  Her triglyceride levels have been progressively increasing over the past 5 years per chart review with recent measurement of 429.  She was prescribed fenofibrate  but unfortunately did not take the medicine consistently.  At her visit she had noted she was experiencing occasional chest pain with radiation into her arm but had not been evaluated by cardiology due to recent relocation to the area.  Blood pressure has been well-maintained on HCTZ.  Joint pain continues to happen particularly at night which we she was prescribed Celebrex  which she finds effective.  She was continued on her current medication regimen.  Was scheduled for an updated lipid panel.  And was referred to cardiology for follow-up of ongoing chest discomfort with radiation to the right arm.   Patient presents today for evaluation of chest discomfort with radiation into her right arm and to her neck.  She states that she has a strong family history of coronary artery disease in her brother and sister.  She states that she has not been compliant with her medication for triglycerides and the dose was recently increased by her PCP.  Her last triglycerides were noted to be 429.  She describes the chest pain as a tightness that radiates into her left arm and occasionally her neck with rest or activity.  She states that it is short lived.   There were no alleviating or aggravating factors.  She states that lasts approximately 1 to 2 minutes each time.  She does have an increase amount of stress that she is undergoing as she has been helping care for her sister who has heart failure and there are some issues with her current living arrangements.  She has no prior cardiac history.  Denies any hospitalizations or visits to the emergency department.  ROS: 10 point review of systems has been reviewed and considered negative except what is listed in the HPI  Studies Reviewed EKG Interpretation Date/Time:  Monday October 05 2023 08:51:50 EDT Ventricular Rate:  69 PR Interval:  166 QRS Duration:  72 QT Interval:  402 QTC Calculation: 430 R Axis:   24  Text Interpretation: Normal sinus rhythm Cannot rule out Inferior infarct , age undetermined When compared with ECG of 03-Mar-2018 12:18, Confirmed by Gerard Frederick ([id number]) on 10/05/2023 9:16:22 AM    Risk Assessment/Calculations           Physical Exam VS:  BP 124/63 (BP Location: Right Arm, Patient Position: Sitting, Cuff Size: Normal)   Pulse 69   Ht 5' (1.524 m)   Wt 165 lb 9.6 oz (75.1 kg)   SpO2 98%   BMI 32.34 kg/m        Wt Readings from Last 3 Encounters:  10/05/23 165 lb 9.6 oz (75.1 kg)  09/09/23 165 lb 9.6 oz (75.1 kg)  09/04/23 160 lb (72.6 kg)    GEN: Well nourished, well developed in no acute distress NECK: No JVD;  No carotid bruits CARDIAC: RRR, no murmurs, rubs, gallops RESPIRATORY:  Clear to auscultation without rales, wheezing or rhonchi  ABDOMEN: Soft, non-tender, non-distended EXTREMITIES:  No edema; No deformity   ASSESSMENT AND PLAN Atypical chest pain that has been ongoing off and on for the last several months with radiation into the left arm and occasionally into the neck.  She has not had any formal cardiac testing completed in the past with her family history of coronary artery disease and brothers and sisters.  EKG today sinus rhythm with a rate  of 69 with concerns for an old inferior infarct.  Discussion was held on calcium scoring versus a coronary CTA. CTA would be more beneficial in visualizing the coronary arteries and identifying blockages and plaque buildup, while calcium scoring would primarily focus on quantifying the amount of calcium in the arteries, which is likely due to age. She has been scheduled for coronary CTA to determine ischemic causes of her chest discomfort.  With her family history and hypertriglyceridemia history is concerning she may have some degree of coronary artery disease.  She will be sent for labs today to reevaluate kidney function and given a one-time dose of metoprolol  prior to coronary CTA.  She has also been scheduled for an echocardiogram to rule out any structural abnormalities.  Hyperlipidemia/hypertriglyceridemia with the last triglyceride level being 429.  She is continued on fenofibrate  160 mg daily.  She has been encouraged advised to take this medication and given the benefits of elevated triglycerides leading to potential episodes of pancreatitis.  Ongoing management per PCP.  Hypertension with a blood pressure today 124/63.  She has been continued on hydrochlorothiazide  12.5 mg daily.  She has been encouraged to monitor pressures 1 to 2 hours postmedication administration at home as well.       Dispo: Patient to return to clinic to see MD/APP once testing is completed or sooner if needed for further evaluation.  Signed, Shevon Sian, NP

## 2023-10-06 ENCOUNTER — Ambulatory Visit: Payer: Self-pay | Admitting: Cardiology

## 2023-10-06 ENCOUNTER — Other Ambulatory Visit: Payer: Self-pay | Admitting: Cardiology

## 2023-10-06 LAB — BASIC METABOLIC PANEL WITH GFR
BUN/Creatinine Ratio: 14 (ref 12–28)
BUN: 12 mg/dL (ref 8–27)
CO2: 20 mmol/L (ref 20–29)
Calcium: 9.2 mg/dL (ref 8.7–10.3)
Chloride: 101 mmol/L (ref 96–106)
Creatinine, Ser: 0.87 mg/dL (ref 0.57–1.00)
Glucose: 92 mg/dL (ref 70–99)
Potassium: 4 mmol/L (ref 3.5–5.2)
Sodium: 138 mmol/L (ref 134–144)
eGFR: 71 mL/min/1.73 (ref >59)

## 2023-10-06 LAB — CBC
Hematocrit: 40.1 % (ref 34.0–46.6)
Hemoglobin: 12.5 g/dL (ref 11.1–15.9)
MCH: 27.4 pg (ref 26.6–33.0)
MCHC: 31.2 g/dL — ABNORMAL LOW (ref 31.5–35.7)
MCV: 88 fL (ref 79–97)
Platelets: 281 x10E3/uL (ref 150–450)
RBC: 4.57 x10E6/uL (ref 3.77–5.28)
RDW: 14.2 % (ref 11.7–15.4)
WBC: 7.8 x10E3/uL (ref 3.4–10.8)

## 2023-10-06 NOTE — Progress Notes (Signed)
 All lab results remained stable.  No changes needed to current medication regimen at this time.

## 2023-10-15 ENCOUNTER — Ambulatory Visit: Admitting: Dermatology

## 2023-10-19 ENCOUNTER — Other Ambulatory Visit: Payer: Self-pay | Admitting: Cardiology

## 2023-11-06 LAB — COLOGUARD: COLOGUARD: POSITIVE — AB

## 2023-11-10 NOTE — Addendum Note (Signed)
 Addended by: BERNARDO FEND on: 11/10/2023 08:39 AM   Modules accepted: Orders

## 2023-11-11 ENCOUNTER — Encounter (HOSPITAL_COMMUNITY): Payer: Self-pay

## 2023-11-13 ENCOUNTER — Telehealth (HOSPITAL_COMMUNITY): Payer: Self-pay | Admitting: *Deleted

## 2023-11-13 NOTE — Telephone Encounter (Signed)
 Attempted to call patient regarding upcoming cardiac CT appointment. Left message on voicemail with name and callback number Sid Seats RN Navigator Cardiac Imaging [facility] Services [phone] Office

## 2023-11-16 ENCOUNTER — Ambulatory Visit: Admission: RE | Admit: 2023-11-16 | Source: Ambulatory Visit

## 2023-11-19 ENCOUNTER — Ambulatory Visit

## 2023-11-23 ENCOUNTER — Ambulatory Visit: Admitting: Cardiology

## 2023-12-06 ENCOUNTER — Other Ambulatory Visit: Payer: Self-pay | Admitting: Internal Medicine

## 2023-12-06 DIAGNOSIS — E781 Pure hyperglyceridemia: Secondary | ICD-10-CM

## 2023-12-06 DIAGNOSIS — I10 Essential (primary) hypertension: Secondary | ICD-10-CM

## 2023-12-06 DIAGNOSIS — M1990 Unspecified osteoarthritis, unspecified site: Secondary | ICD-10-CM

## 2023-12-08 NOTE — Telephone Encounter (Signed)
 Fenofibrate  discontinued 03/12/23. Too soon for refill for celecoxib  and hydrochlorothiazide .  Requested Prescriptions  Pending Prescriptions Disp Refills   fenofibrate  (TRICOR ) 48 MG tablet [Pharmacy Med Name: FENOFIBRATE  48MG  TABLETS] 90 tablet 0    Sig: TAKE 1 TABLET(48 MG) BY MOUTH DAILY     Cardiovascular:  Antilipid - Fibric Acid Derivatives Failed - 12/08/2023 12:40 PM      Failed - Lipid Panel in normal range within the last 12 months    Cholesterol, Total  Date Value Ref Range Status  12/03/2021 197 100 - 199 mg/dL Final   Cholesterol  Date Value Ref Range Status  03/12/2023 203 (H) <200 mg/dL Final   LDL Cholesterol (Calc)  Date Value Ref Range Status  03/12/2023  mg/dL (calc) Final    Comment:    . LDL cholesterol not calculated. Triglyceride levels greater than 400 mg/dL invalidate calculated LDL results. . Reference range: <100 . Desirable range <100 mg/dL for primary prevention;   <70 mg/dL for patients with CHD or diabetic patients  with > or = 2 CHD risk factors. SABRA LDL-C is now calculated using the Martin-Hopkins  calculation, which is a validated novel method providing  better accuracy than the Friedewald equation in the  estimation of LDL-C.  Gladis APPLETHWAITE et al. SANDREA. 7986;689(80): 2061-2068  (http://education.[facility].com/faq/FAQ164)    HDL  Date Value Ref Range Status  03/12/2023 44 (L) > OR = 50 mg/dL Final  90/73/7976 41 >60 mg/dL Final   Triglycerides  Date Value Ref Range Status  03/12/2023 429 (H) <150 mg/dL Final    Comment:    . If a non-fasting specimen was collected, consider repeat triglyceride testing on a fasting specimen if clinically indicated.  Veatrice et al. J. of Clin. Lipidol. 2015;9:129-169. SABRA          Passed - ALT in normal range and within 360 days    ALT  Date Value Ref Range Status  03/12/2023 14 6 - 29 U/L Final         Passed - AST in normal range and within 360 days    AST  Date Value Ref Range Status   03/12/2023 18 10 - 35 U/L Final         Passed - Cr in normal range and within 360 days    Creat  Date Value Ref Range Status  03/12/2023 0.86 0.60 - 1.00 mg/dL Final   Creatinine, Ser  Date Value Ref Range Status  10/05/2023 0.87 0.57 - 1.00 mg/dL Final         Passed - HGB in normal range and within 360 days    Hemoglobin  Date Value Ref Range Status  10/05/2023 12.5 11.1 - 15.9 g/dL Final         Passed - HCT in normal range and within 360 days    Hematocrit  Date Value Ref Range Status  10/05/2023 40.1 34.0 - 46.6 % Final         Passed - PLT in normal range and within 360 days    Platelets  Date Value Ref Range Status  10/05/2023 281 150 - 450 x10E3/uL Final         Passed - WBC in normal range and within 360 days    WBC  Date Value Ref Range Status  10/05/2023 7.8 3.4 - 10.8 x10E3/uL Final  03/12/2023 8.0 3.8 - 10.8 Thousand/uL Final         Passed - eGFR is 30 or above and within 360  days    GFR calc Af Amer  Date Value Ref Range Status  07/14/2019 95 >59 mL/min/1.73 Final    Comment:    **[facility] currently reports eGFR in compliance with the current**   recommendations of the [employer]. [facility] will   update reporting as new guidelines are published from the NKF-ASN   Task force.    GFR calc non Af Amer  Date Value Ref Range Status  07/14/2019 83 >59 mL/min/1.73 Final   eGFR  Date Value Ref Range Status  10/05/2023 71 >59 mL/min/1.73 Final         Passed - Valid encounter within last 12 months    Recent Outpatient Visits           3 months ago Hypertension, unspecified type   [facility] Bernardo Fend, DO       Future Appointments             In 2 months Bernardo Fend, DO [facility], [place]             celecoxib  (CELEBREX ) 50 MG capsule [Pharmacy Med Name: CELECOXIB  50MG  CAPSULES] 90 capsule 0    Sig: TAKE 1 CAPSULE(50 MG) BY MOUTH DAILY AS  NEEDED FOR PAIN     Analgesics:  COX2 Inhibitors Failed - 12/08/2023 12:40 PM      Failed - Manual Review: Labs are only required if the patient has taken medication for more than 8 weeks.      Passed - HGB in normal range and within 360 days    Hemoglobin  Date Value Ref Range Status  10/05/2023 12.5 11.1 - 15.9 g/dL Final         Passed - Cr in normal range and within 360 days    Creat  Date Value Ref Range Status  03/12/2023 0.86 0.60 - 1.00 mg/dL Final   Creatinine, Ser  Date Value Ref Range Status  10/05/2023 0.87 0.57 - 1.00 mg/dL Final         Passed - HCT in normal range and within 360 days    Hematocrit  Date Value Ref Range Status  10/05/2023 40.1 34.0 - 46.6 % Final         Passed - AST in normal range and within 360 days    AST  Date Value Ref Range Status  03/12/2023 18 10 - 35 U/L Final         Passed - ALT in normal range and within 360 days    ALT  Date Value Ref Range Status  03/12/2023 14 6 - 29 U/L Final         Passed - eGFR is 30 or above and within 360 days    GFR calc Af Amer  Date Value Ref Range Status  07/14/2019 95 >59 mL/min/1.73 Final    Comment:    **[facility] currently reports eGFR in compliance with the current**   recommendations of the [employer]. [facility] will   update reporting as new guidelines are published from the NKF-ASN   Task force.    GFR calc non Af Amer  Date Value Ref Range Status  07/14/2019 83 >59 mL/min/1.73 Final   eGFR  Date Value Ref Range Status  10/05/2023 71 >59 mL/min/1.73 Final         Passed - Patient is not pregnant      Passed - Valid encounter within last 12 months    Recent Outpatient Visits  3 months ago Hypertension, unspecified type   [facility] Bernardo Fend, DO       Future Appointments             In 2 months Bernardo Fend, DO [facility], [place]             hydrochlorothiazide   (MICROZIDE ) 12.5 MG capsule Wil[facility] Med Name: HYDROCHLOROTHIAZIDE  12.5MG  CAPSULES] 90 capsule 1    Sig: TAKE 1 CAPSULE(12.5 MG) BY MOUTH DAILY     Cardiovascular: Diuretics - Thiazide Passed - 12/08/2023 12:40 PM      Passed - Cr in normal range and within 180 days    Creat  Date Value Ref Range Status  03/12/2023 0.86 0.60 - 1.00 mg/dL Final   Creatinine, Ser  Date Value Ref Range Status  10/05/2023 0.87 0.57 - 1.00 mg/dL Final         Passed - K in normal range and within 180 days    Potassium  Date Value Ref Range Status  10/05/2023 4.0 3.5 - 5.2 mmol/L Final         Passed - Na in normal range and within 180 days    Sodium  Date Value Ref Range Status  10/05/2023 138 134 - 144 mmol/L Final         Passed - Last BP in normal range    BP Readings from Last 1 Encounters:  10/05/23 124/63         Passed - Valid encounter within last 6 months    Recent Outpatient Visits           3 months ago Hypertension, unspecified type   [facility] Bernardo Fend, DO       Future Appointments             In 2 months Bernardo Fend, DO [facility], [place]

## 2023-12-10 ENCOUNTER — Ambulatory Visit

## 2023-12-30 ENCOUNTER — Other Ambulatory Visit

## 2023-12-31 ENCOUNTER — Encounter (HOSPITAL_COMMUNITY): Payer: Self-pay

## 2024-01-01 ENCOUNTER — Ambulatory Visit: Admitting: Cardiology

## 2024-01-01 ENCOUNTER — Telehealth (HOSPITAL_COMMUNITY): Payer: Self-pay | Admitting: Emergency Medicine

## 2024-01-01 ENCOUNTER — Ambulatory Visit: Attending: Cardiology

## 2024-01-01 DIAGNOSIS — R079 Chest pain, unspecified: Secondary | ICD-10-CM | POA: Diagnosis not present

## 2024-01-01 LAB — ECHOCARDIOGRAM COMPLETE
AR max vel: 2.6 cm2
AV Area VTI: 2.74 cm2
AV Area mean vel: 2.55 cm2
AV Mean grad: 1 mmHg
AV Peak grad: 2.6 mmHg
Ao pk vel: 0.81 m/s
Area-P 1/2: 3.85 cm2
S' Lateral: 3.2 cm

## 2024-01-01 NOTE — Telephone Encounter (Signed)
 Reaching out to patient to offer assistance regarding upcoming cardiac imaging study; pt verbalizes understanding of appt date/time, parking situation and where to check in, pre-test NPO status and medications ordered, and verified current allergies; name and call back number provided for further questions should they arise Rockwell Alexandria RN Navigator Cardiac Imaging [facility] [phone] office [phone] cell

## 2024-01-04 ENCOUNTER — Ambulatory Visit
Admission: RE | Admit: 2024-01-04 | Discharge: 2024-01-04 | Disposition: A | Discharge status: Home / Self Care | Source: Ambulatory Visit | Attending: Cardiology | Admitting: Cardiology

## 2024-01-04 DIAGNOSIS — I2510 Atherosclerotic heart disease of native coronary artery without angina pectoris: Secondary | ICD-10-CM | POA: Insufficient documentation

## 2024-01-04 DIAGNOSIS — R079 Chest pain, unspecified: Secondary | ICD-10-CM | POA: Insufficient documentation

## 2024-01-04 MED ORDER — NITROGLYCERIN 0.4 MG SL SUBL
0.8000 mg | SUBLINGUAL_TABLET | Freq: Once | SUBLINGUAL | Status: AC
Start: 2024-01-04 — End: 2024-01-04
  Administered 2024-01-04: 0.8 mg via SUBLINGUAL
  Filled 2024-01-04: qty 25

## 2024-01-04 MED ORDER — IOHEXOL 350 MG/ML SOLN
100.0000 mL | Freq: Once | INTRAVENOUS | Status: AC | PRN
  Administered 2024-01-04: 100 mL via INTRAVENOUS

## 2024-01-10 NOTE — Progress Notes (Signed)
 No significant findings on the chest CT portion of the coronary CTA.

## 2024-01-13 ENCOUNTER — Ambulatory Visit

## 2024-01-15 ENCOUNTER — Encounter: Payer: Self-pay | Admitting: Cardiology

## 2024-01-15 ENCOUNTER — Ambulatory Visit: Attending: Cardiology | Admitting: Cardiology

## 2024-01-15 VITALS — BP 128/66 | HR 73 | Ht 60.0 in | Wt 166.0 lb

## 2024-01-15 DIAGNOSIS — I10 Essential (primary) hypertension: Secondary | ICD-10-CM

## 2024-01-15 DIAGNOSIS — E781 Pure hyperglyceridemia: Secondary | ICD-10-CM | POA: Diagnosis not present

## 2024-01-15 DIAGNOSIS — E782 Mixed hyperlipidemia: Secondary | ICD-10-CM | POA: Diagnosis not present

## 2024-01-15 DIAGNOSIS — I2510 Atherosclerotic heart disease of native coronary artery without angina pectoris: Secondary | ICD-10-CM

## 2024-01-15 NOTE — Patient Instructions (Signed)
 Medication Instructions:  Your physician recommends that you continue on your current medications as directed. Please refer to the Current Medication list given to you today.   *If you need a refill on your cardiac medications before your next appointment, please call your pharmacy*  Lab Work: Your provider would like for you to return in with PCP to have the following labs drawn: Lpa, Direct LDL.   Please go to [facility] [address], [place] [address] You do not need an appointment.  They are open from 8 am- 4:30 pm.  Lunch from 1:00 pm- 2:00 pm You do not need to be fasting.  If you have labs (blood work) drawn today and your tests are completely normal, you will receive your results only by: MyChart Message (if you have MyChart) OR A paper copy in the mail If you have any lab test that is abnormal or we need to change your treatment, we will call you to review the results.  Testing/Procedures: No test ordered today   Follow-Up: At [facility], you and your health needs are our priority.  As part of our continuing mission to provide you with exceptional heart care, our providers are all part of one team.  This team includes your primary Cardiologist (physician) and Advanced Practice Providers or APPs (Physician Assistants and Nurse Practitioners) who all work together to provide you with the care you need, when you need it.  Your next appointment:   3 month(s)  Provider:   You may see one of the following Advanced Practice Providers on your designated Care Team:   Tylene Lunch, NP

## 2024-01-15 NOTE — Progress Notes (Signed)
 Cardiology Office Note   Date:  01/15/2024  ID:  Vanessa Casey, DOB 29-Apr-1950, MRN [id number] PCP: Bernardo Fend, DO  [facility] Providers Cardiologist:  None Cardiology APP:  Gerard Frederick, NP     History of Present Illness Vanessa Casey is a 73 y.o. female with a past medical history of mixed hyperlipidemia, hypertension, history of multiple basal cell carcinomas, hypertriglyceridemia, who is being seen today for follow-up.   Patient was recently evaluated by her PCP on 09/09/2023 following up on her chronic medical conditions.  Her triglyceride levels have been progressively increasing over the past 5 years per chart review with recent measurement of 429.  She was prescribed fenofibrate  but unfortunately did not take the medicine consistently.  At her visit she had noted she was experiencing occasional chest pain with radiation into her arm but had not been evaluated by cardiology due to recent relocation to the area.  Blood pressure has been well-maintained on HCTZ.  Joint pain continues to happen particularly at night which we she was prescribed Celebrex  which she finds effective.  She was continued on her current medication regimen.  Was scheduled for an updated lipid panel.  And was referred to cardiology for follow-up of ongoing chest discomfort with radiation to the right arm.  She was previously evaluated in clinic on 10/05/2023 for chest discomfort with radiation to right arm and neck.  States she does follow strong family history of coronary artery disease in her sister and her brother.  She described the chest pain as a tightness that radiated into her left arm and occasionally to her neck with rest and activity.  She states that it is short-lived and there are no alleviating aggravating factors.  She was scheduled for a coronary CTA and an echocardiogram.   She returns clinic today stating that she has been doing fairly well from a cardiac perspective.  She also has chest  discomfort when she is stressed or yelling.  She believes that a large quantity of her issues are related to stress due to family as she is taking care of several family members.  States that she has been compliant with her current medication regimen.  She has upcoming follow-up and labs with her primary care provider.  Denies any hospitalizations or visits to the emergency department.  ROS: 10 point review of systems has been reviewed and considered negative except ones been listed in the HPI  Studies Reviewed EKG Interpretation Date/Time:  Friday January 15 2024 11:29:02 EST Ventricular Rate:  73 PR Interval:  162 QRS Duration:  78 QT Interval:  418 QTC Calculation: 460 R Axis:   29  Text Interpretation: Normal sinus rhythm Possible Inferior infarct (cited on or before 05-Oct-2023) When compared with ECG of 05-Oct-2023 08:51, No significant change since last tracing Confirmed by Gerard Frederick ([id number]) on 01/15/2024 11:32:26 AM    cCTA 01/04/2024 IMPRESSION: 1. Coronary calcium score of 24.8. This was 49th percentile for age and sex matched control.   2. Normal coronary origin with right dominance.   3. Mild proximal LAD stenosis (25-49%).   4. Minimal distal LM disease (<25%).   5. CAD-RADS 2. Mild non-obstructive CAD (25-49%). Consider non-atherosclerotic causes of chest pain. Consider preventive therapy and risk factor modification.  2d echo 01/01/2024 1. Left ventricular ejection fraction, by estimation, is 55 to 60%. The  left ventricle has normal function. The left ventricle has no regional  wall motion abnormalities. Left ventricular diastolic parameters are  consistent with Grade I  diastolic  dysfunction (impaired relaxation).   2. Right ventricular systolic function is normal. The right ventricular  size is normal.   3. The mitral valve is normal in structure. Mild mitral valve  regurgitation.   4. The aortic valve is tricuspid. Aortic valve regurgitation is not   visualized.   5. The inferior vena cava is normal in size with greater than 50%  respiratory variability, suggesting right atrial pressure of 3 mmHg.    Risk Assessment/Calculations           Physical Exam VS:  BP 128/66 (BP Location: Left Arm, Patient Position: Sitting, Cuff Size: Large)   Pulse 73   Ht 5' (1.524 m)   Wt 166 lb (75.3 kg)   SpO2 97%   BMI 32.42 kg/m        Wt Readings from Last 3 Encounters:  01/15/24 166 lb (75.3 kg)  10/05/23 165 lb 9.6 oz (75.1 kg)  09/09/23 165 lb 9.6 oz (75.1 kg)    GEN: Well nourished, well developed in no acute distress NECK: No JVD; No carotid bruits CARDIAC: RRR, no murmurs, rubs, gallops RESPIRATORY:  Clear to auscultation without rales, wheezing or rhonchi  ABDOMEN: Soft, non-tender, non-distended EXTREMITIES:  No edema; No deformity   ASSESSMENT AND PLAN Mild nonobstructive coronary artery disease noted on recent coronary CTA.  Coronary calcium score 24.8 which is 49 percentile for age and sex matched control.  There is mild proximal LAD stenosis of 25-49% and minimal distal left main disease less than 25%.  She is CHA2DS2-VASc 2.  Mild nonobstructive CAD (25-49%).  Consider nonatherosclerotic causes of chest pain preventative therapy and risk factor modification.  EKG today reveals sinus rhythm with a rate of 73 with no acute ischemic changes noted.  No further ischemic evaluation needed at this time.  Long discussion had today about preventing progression of the disease that she already had is when she had this weight loss, cholesterol management, dietary modifications.  Hyperlipidemia/hypertriglyceridemia with a last LDL unable to be measured due to the elevation in her triglycerides.  She is currently on fenofibrate  160 mg daily.  She has been advised that her LDL goal is 70 or less.  She has upcoming labs with her PCP.  We have also ordered an LP(a) for further risk stratification and her direct LDL to be drawn when she has  upcoming labs to determine what her LDL actually is.  She will require statin therapy to get to her goal but would like to have her follow-up with her PCP and her labs drawn.  Deferred medication changes today.  Hypertension with a blood pressure 128/66.  Blood pressures remain stable.  She is continued on HCTZ 12.5 mg daily.  She has been encouraged to continue to monitor pressures 1 to 2 hours postmedication administration as well.       Dispo: Patient to return to clinic to see MD/APP in 3 months or sooner if needed for further evaluation.  Signed, Matthan Sledge, NP

## 2024-02-09 ENCOUNTER — Other Ambulatory Visit: Payer: Self-pay

## 2024-02-09 ENCOUNTER — Ambulatory Visit: Admitting: Internal Medicine

## 2024-02-09 ENCOUNTER — Encounter: Payer: Self-pay | Admitting: Internal Medicine

## 2024-02-09 VITALS — BP 130/72 | HR 78 | Temp 98.2°F | Resp 16 | Ht 60.0 in | Wt 168.2 lb

## 2024-02-09 DIAGNOSIS — Z23 Encounter for immunization: Secondary | ICD-10-CM

## 2024-02-09 DIAGNOSIS — J309 Allergic rhinitis, unspecified: Secondary | ICD-10-CM | POA: Diagnosis not present

## 2024-02-09 DIAGNOSIS — E781 Pure hyperglyceridemia: Secondary | ICD-10-CM | POA: Diagnosis not present

## 2024-02-09 DIAGNOSIS — I10 Essential (primary) hypertension: Secondary | ICD-10-CM | POA: Diagnosis not present

## 2024-02-09 MED ORDER — FENOFIBRATE 160 MG PO TABS: 160.0000 mg | ORAL_TABLET | Freq: Every day | ORAL | 1 refills | Status: AC

## 2024-02-09 MED ORDER — HYDROCHLOROTHIAZIDE 12.5 MG PO CAPS: 12.5000 mg | ORAL_CAPSULE | Freq: Every day | ORAL | 1 refills | Status: AC

## 2024-02-09 NOTE — Progress Notes (Signed)
 Established Patient Office Visit  Subjective   Patient ID: Vanessa Casey, female    DOB: 02-15-1951  Age: 73 y.o. MRN: [id number]  Chief Complaint  Patient presents with   Medical Management of Chronic Issues    6 month recheck    HPI  Patient is here for follow up on chronic medical conditions.   Discussed the use of AI scribe software for clinical note transcription with the patient, who gave verbal consent to proceed.  History of Present Illness  Vanessa Casey is a 73 year old female who presents for a follow-up visit.  Last night she had about three hours of marked sneezing and rhinorrhea, suspected allergies. Half a Benadryl  helped somewhat but a full dose causes significant drowsiness. Advil  Cold and Sinus did not help.  She takes hydrochlorothiazide  and recently restarted fenofibrate  after a lapse. She saw a cardiologist since her last visit and had a coronary scan. She has had no recent chest pain.  She has a positive Cologuard and is scheduled to see gastroenterology on December 31.  She is up to date on flu, pneumonia, tetanus, and COVID vaccines. She has not received the shingles vaccine despite a prior episode of shingles.  She is interested in weight loss. She reports Medicare will not cover weight loss medication without conditions such as diabetes, sleep apnea, or fatty liver disease. She is considering lifestyle changes for weight management.   Hypertension: -Medications: hydrochlorothiazide  12.5 mg  -Checking BP at home (average): 120-130/70-80 -Denies any SOB, vision changes, LE edema or symptoms of hypotension. Still having occasional chest discomfort that will resolve after a few minutes but was not seen by Cardiology yet.    History of multiple basal cell carcinomas removed: -Had a BCC removed September 2023 on her face and forehead but recently had an eye surgery and was found to have another BCC removed on her eyelid last month -Following with  dermatology on a yearly basis, will call to make another appointment   Hypertriglyceridemia: -Back on Tricor  160 mg, never been on a statin -Last lipid panel: Lipid Panel     Component Value Date/Time   CHOL 203 (H) 03/12/2023 1052   CHOL 197 12/03/2021 1046   TRIG 429 (H) 03/12/2023 1052   HDL 44 (L) 03/12/2023 1052   HDL 41 12/03/2021 1046   CHOLHDL 4.6 03/12/2023 1052   LDLCALC  03/12/2023 1052     Comment:     . LDL cholesterol not calculated. Triglyceride levels greater than 400 mg/dL invalidate calculated LDL results. . Reference range: <100 . Desirable range <100 mg/dL for primary prevention;   <70 mg/dL for patients with CHD or diabetic patients  with > or = 2 CHD risk factors. SABRA LDL-C is now calculated using the Martin-Hopkins  calculation, which is a validated novel method providing  better accuracy than the Friedewald equation in the  estimation of LDL-C.  Gladis APPLETHWAITE et al. SANDREA. 7986;689(80): 2061-2068  (http://education.[facility].com/faq/FAQ164)    LABVLDL 61 (H) 12/03/2021 1046    Health Maintenance: -Blood work UTD -Mammogram 5/25 Birads-1 -Colon cancer screening: cologuard positive in August, has appointment on 12/31 with GI   Patient Active Problem List   Diagnosis Date Noted   Mixed hyperlipidemia 07/17/2020   HTN (hypertension) 04/24/2019   Post-operative nausea and vomiting 04/30/2017   Advance directive discussed with patient 01/12/2017   Past Medical History:  Diagnosis Date   Acute cholecystitis 03/04/2018   Atypical mole 07/30/2017   left breast/excision   Basal  cell carcinoma 07/30/2017   right jaw   Basal cell carcinoma 09/16/2017   left upper arm/excision   Basal cell carcinoma 11/22/2018   right upper cutaneous lip   Basal cell carcinoma 09/23/2022   left upper lip near nasolabial fold/refer for Mohs [facility]   BCC (basal cell carcinoma of skin) 11/07/2021   upper mid forehead (Nodular)  clear wih bx 09/23/22   BCC (basal  cell carcinoma of skin) 11/07/2021   left upper forehead at hairline (superficial)  clear with bx 09/23/22   Edema    FEET/ LEGS   Hypertension    Medical history non-contributory    Post-operative nausea and vomiting 04/30/2017   Shingles    Past Surgical History:  Procedure Laterality Date   BASAL CELL CARCINOMA EXCISION     CATARACT EXTRACTION W/PHACO Right 05/05/2017   Procedure: CATARACT EXTRACTION PHACO AND INTRAOCULAR LENS PLACEMENT (IOC);  Surgeon: Jaye Fallow, MD;  Location: [facility] OR[facility];  Service: Ophthalmology;  Laterality: Right;  US  00:33.2 AP% 15.8 CDE 5.25 Fluid Pack Lot # [id number]   CATARACT EXTRACTION W/PHACO Left 06/09/2017   Procedure: CATARACT EXTRACTION PHACO AND INTRAOCULAR LENS PLACEMENT (IOC);  Surgeon: Jaye Fallow, MD;  Location: [facility];  Service: Ophthalmology;  Laterality: Left;  US  01:02.2 AP% 15.2 CDE 9.48 Fluid Pack Lot # [id number]   CHOLECYSTECTOMY N/A 03/04/2018   Procedure: LAPAROSCOPIC CHOLECYSTECTOMY;  Surgeon: Jordis Laneta FALCON, MD;  Location: [facility];  Service: General;  Laterality: N/A;   TOTAL ABDOMINAL HYSTERECTOMY     Social History   Tobacco Use   Smoking status: Never   Smokeless tobacco: Never  Vaping Use   Vaping status: Never Used  Substance Use Topics   Alcohol use: No   Drug use: No   Social History   Socioeconomic History   Marital status: Single    Spouse name: Not on file   Number of children: Not on file   Years of education: Not on file   Highest education level: Not on file  Occupational History   Not on file  Tobacco Use   Smoking status: Never   Smokeless tobacco: Never  Vaping Use   Vaping status: Never Used  Substance and Sexual Activity   Alcohol use: No   Drug use: No   Sexual activity: Not Currently    Birth control/protection: Post-menopausal  Other Topics Concern   Not on file  Social History Narrative   Not on file   Social Drivers of Health   Financial Resource Strain: Low Risk   (09/04/2023)   Overall Financial Resource Strain (CARDIA)    Difficulty of Paying Living Expenses: Not hard at all  Food Insecurity: No Food Insecurity (09/04/2023)   Hunger Vital Sign    Worried About Running Out of Food in the Last Year: Never true    Ran Out of Food in the Last Year: Never true  Transportation Needs: No Transportation Needs (09/04/2023)   PRAPARE - Administrator, Civil Service (Medical): No    Lack of Transportation (Non-Medical): No  Physical Activity: Sufficiently Active (09/04/2023)   Exercise Vital Sign    Days of Exercise per Week: 7 days    Minutes of Exercise per Session: 30 min  Stress: No Stress Concern Present (09/04/2023)   [employer] of Occupational Health - Occupational Stress Questionnaire    Feeling of Stress: Not at all  Social Connections: Socially Isolated (09/04/2023)   Social Connection and Isolation Panel    Frequency of Communication  with Friends and Family: More than three times a week    Frequency of Social Gatherings with Friends and Family: More than three times a week    Attends Religious Services: Never    Database Administrator or Organizations: No    Attends Banker Meetings: Never    Marital Status: Widowed  Intimate Partner Violence: Not At Risk (09/04/2023)   Humiliation, Afraid, Rape, and Kick questionnaire    Fear of Current or Ex-Partner: No    Emotionally Abused: No    Physically Abused: No    Sexually Abused: No   Family Status  Relation Name Status   Mother  Deceased   Father  Deceased   Sister  (Not Specified)   Brother  (Not Specified)   Mat Aunt  (Not Specified)  No partnership data on file   Family History  Problem Relation Age of Onset   Dementia Mother    Heart failure Father    Heart attack Sister    Heart attack Brother    Breast cancer Maternal Aunt    Allergies  Allergen Reactions   Lisinopril  Cough   Losartan  Other (See Comments)    edema    Review of Systems   Constitutional:  Negative for chills and fever.  HENT:  Positive for congestion. Negative for ear pain, sinus pain and sore throat.   Respiratory:  Negative for cough.       Objective:     BP 130/72 (Cuff Size: Large)   Pulse 78   Temp 98.2 F (36.8 C) (Oral)   Resp 16   Ht 5' (1.524 m)   Wt 168 lb 3.2 oz (76.3 kg)   SpO2 97%   BMI 32.85 kg/m  BP Readings from Last 3 Encounters:  02/09/24 130/72  01/15/24 128/66  01/04/24 125/75   Wt Readings from Last 3 Encounters:  02/09/24 168 lb 3.2 oz (76.3 kg)  01/15/24 166 lb (75.3 kg)  10/05/23 165 lb 9.6 oz (75.1 kg)      Physical Exam Constitutional:      Appearance: Normal appearance.  HENT:     Head: Normocephalic and atraumatic.     Right Ear: Tympanic membrane, ear canal and external ear normal.     Left Ear: Tympanic membrane, ear canal and external ear normal.     Nose: Rhinorrhea present.     Mouth/Throat:     Mouth: Mucous membranes are moist.     Pharynx: Oropharynx is clear.  Eyes:     Conjunctiva/sclera: Conjunctivae normal.  Cardiovascular:     Rate and Rhythm: Normal rate and regular rhythm.  Pulmonary:     Effort: Pulmonary effort is normal.     Breath sounds: Normal breath sounds. No wheezing, rhonchi or rales.  Skin:    General: Skin is warm and dry.  Neurological:     General: No focal deficit present.     Mental Status: She is alert. Mental status is at baseline.  Psychiatric:        Mood and Affect: Mood normal.        Behavior: Behavior normal.      No results found for any visits on 02/09/24.  Last CBC Lab Results  Component Value Date   WBC 7.8 10/05/2023   HGB 12.5 10/05/2023   HCT 40.1 10/05/2023   MCV 88 10/05/2023   MCH 27.4 10/05/2023   RDW 14.2 10/05/2023   PLT 281 10/05/2023   Last metabolic panel Lab Results  Component Value Date   GLUCOSE 92 10/05/2023   NA 138 10/05/2023   K 4.0 10/05/2023   CL 101 10/05/2023   CO2 20 10/05/2023   BUN 12 10/05/2023    CREATININE 0.87 10/05/2023   EGFR 71 10/05/2023   CALCIUM 9.2 10/05/2023   PROT 7.7 03/12/2023   ALBUMIN 4.2 12/03/2021   LABGLOB 2.8 12/03/2021   AGRATIO 1.5 12/03/2021   BILITOT 0.3 03/12/2023   ALKPHOS 91 12/03/2021   AST 18 03/12/2023   ALT 14 03/12/2023   ANIONGAP 8 03/04/2018   Last lipids Lab Results  Component Value Date   CHOL 203 (H) 03/12/2023   HDL 44 (L) 03/12/2023   LDLCALC  03/12/2023     Comment:     . LDL cholesterol not calculated. Triglyceride levels greater than 400 mg/dL invalidate calculated LDL results. . Reference range: <100 . Desirable range <100 mg/dL for primary prevention;   <70 mg/dL for patients with CHD or diabetic patients  with > or = 2 CHD risk factors. SABRA LDL-C is now calculated using the Martin-Hopkins  calculation, which is a validated novel method providing  better accuracy than the Friedewald equation in the  estimation of LDL-C.  Gladis APPLETHWAITE et al. SANDREA. 7986;689(80): 2061-2068  (http://education.[facility].com/faq/FAQ164)    TRIG 429 (H) 03/12/2023   CHOLHDL 4.6 03/12/2023   Last hemoglobin A1c Lab Results  Component Value Date   HGBA1C 5.7 (H) 12/11/2016   Last thyroid  functions Lab Results  Component Value Date   TSH 1.930 12/03/2021   Last vitamin D No results found for: 25OHVITD2, 25OHVITD3, VD25OH Last vitamin B12 and Folate No results found for: VITAMINB12, FOLATE    The 10-year ASCVD risk score (Arnett DK, et al., 2019) is: 16.4%    Assessment & Plan:   Assessment & Plan  Allergic rhinitis Symptoms consistent with allergies, differential includes viral illness but examination supports allergies. - Recommended half a Benadryl  for relief. - Advised switching to Claritin or Allegra if symptoms persist.  Hypertriglyceridemia Managed with fenofibrate . Recent coronary scan normal. No recent cholesterol labs due to non-fasting state. - Ordered fasting cholesterol and comprehensive metabolic  panel. - Instructed to return for fasting labs, avoiding sugar or fat.  Essential hypertension Blood pressure well-controlled with hydrochlorothiazide . - Continue hydrochlorothiazide  as prescribed.  General health maintenance Vaccinations up to date. Mammogram normal. Positive Cologuard with GI appointment scheduled. Shingles vaccine declined due to previous infection. - Attend GI appointment on December 31st. - Consider shingles vaccine due to recurrence risk.  - Flu vaccine HIGH DOSE PF(Fluzone Trivalent) - Lipid Profile - Lipoprotein A (LPA) - fenofibrate  160 MG tablet; Take 1 tablet (160 mg total) by mouth daily.  Dispense: 90 tablet; Refill: 1 - hydrochlorothiazide  (MICROZIDE ) 12.5 MG capsule; Take 1 capsule (12.5 mg total) by mouth daily.  Dispense: 90 capsule; Refill: 1 - Comprehensive Metabolic Panel (CMET) - CBC w/Diff/Platelet   Return in about 6 months (around 08/09/2024).    Sharyle Fischer, DO

## 2024-03-24 ENCOUNTER — Telehealth: Payer: Self-pay

## 2024-03-24 ENCOUNTER — Other Ambulatory Visit: Payer: Self-pay | Admitting: Internal Medicine

## 2024-03-24 DIAGNOSIS — M25559 Pain in unspecified hip: Secondary | ICD-10-CM

## 2024-03-24 MED ORDER — CELECOXIB 50 MG PO CAPS: 50.0000 mg | ORAL_CAPSULE | Freq: Two times a day (BID) | ORAL | 0 refills | Status: AC | PRN

## 2024-03-24 NOTE — Telephone Encounter (Signed)
 Copied from CRM #[id number]. Topic: Clinical - Medication Question >> Mar 24, 2024 10:45 AM Burnard DEL wrote: Reason for CRM: Patient called in stating that she has had some hip pain for 3 days and about a year ago  She was prescribed Celebrex .She would like to know could she have another prescription of this medication for her hip pain?   [id number] [place], [place] - [address] AT [facility] [address]  Phone: [phone] Fax: [phone]

## 2024-04-06 ENCOUNTER — Other Ambulatory Visit: Payer: Self-pay | Admitting: Internal Medicine

## 2024-04-06 DIAGNOSIS — M25559 Pain in unspecified hip: Secondary | ICD-10-CM

## 2024-04-07 NOTE — Telephone Encounter (Signed)
 Requested medications are due for refill today.  yes  Requested medications are on the active medications list.  yes  Last refill. 03/24/2024 #30 0 rf  Future visit scheduled.   yes  Notes to clinic.  New medication to this pt.    Requested Prescriptions  Pending Prescriptions Disp Refills   celecoxib  (CELEBREX ) 50 MG capsule [Pharmacy Med Name: CELECOXIB  50MG  CAPSULES] 180 capsule     Sig: TAKE 1 CAPSULE(50 MG) BY MOUTH TWICE DAILY AS NEEDED FOR PAIN     Analgesics:  COX2 Inhibitors Failed - 04/07/2024  9:53 AM      Failed - Manual Review: Labs are only required if the patient has taken medication for more than 8 weeks.      Failed - AST in normal range and within 360 days    AST  Date Value Ref Range Status  03/12/2023 18 10 - 35 U/L Final         Failed - ALT in normal range and within 360 days    ALT  Date Value Ref Range Status  03/12/2023 14 6 - 29 U/L Final         Passed - HGB in normal range and within 360 days    Hemoglobin  Date Value Ref Range Status  10/05/2023 12.5 11.1 - 15.9 g/dL Final         Passed - Cr in normal range and within 360 days    Creat  Date Value Ref Range Status  03/12/2023 0.86 0.60 - 1.00 mg/dL Final   Creatinine, Ser  Date Value Ref Range Status  10/05/2023 0.87 0.57 - 1.00 mg/dL Final         Passed - HCT in normal range and within 360 days    Hematocrit  Date Value Ref Range Status  10/05/2023 40.1 34.0 - 46.6 % Final         Passed - eGFR is 30 or above and within 360 days    GFR calc Af Amer  Date Value Ref Range Status  07/14/2019 95 >59 mL/min/1.73 Final    Comment:    **[facility] currently reports eGFR in compliance with the current**   recommendations of the [employer]. [facility] will   update reporting as new guidelines are published from the NKF-ASN   Task force.    GFR calc non Af Amer  Date Value Ref Range Status  07/14/2019 83 >59 mL/min/1.73 Final   eGFR  Date Value Ref Range Status   10/05/2023 71 >59 mL/min/1.73 Final         Passed - Patient is not pregnant      Passed - Valid encounter within last 12 months    Recent Outpatient Visits           1 month ago Hypertriglyceridemia   [facility] Bernardo Fend, DO   7 months ago Hypertension, unspecified type   [facility] Bernardo Fend, DO       Future Appointments             In 1 week Gerard Frederick, NP [facility]

## 2024-04-18 ENCOUNTER — Ambulatory Visit: Admitting: Cardiology

## 2024-08-09 ENCOUNTER — Ambulatory Visit: Admitting: Internal Medicine

## 2024-09-16 ENCOUNTER — Ambulatory Visit
